# Patient Record
Sex: Male | Born: 1935 | Race: White | Hispanic: No | Marital: Married | Smoking: Former smoker
Health system: Southern US, Community
[De-identification: ages and names within clinical notes are randomized; demographics above are authoritative.]

## PROBLEM LIST (undated history)

## (undated) DIAGNOSIS — R001 Bradycardia, unspecified: Secondary | ICD-10-CM

## (undated) DIAGNOSIS — I639 Cerebral infarction, unspecified: Secondary | ICD-10-CM

## (undated) DIAGNOSIS — Z7901 Long term (current) use of anticoagulants: Secondary | ICD-10-CM

## (undated) DIAGNOSIS — F32A Depression, unspecified: Secondary | ICD-10-CM

## (undated) DIAGNOSIS — I4891 Unspecified atrial fibrillation: Principal | ICD-10-CM

## (undated) DIAGNOSIS — K602 Anal fissure, unspecified: Secondary | ICD-10-CM

## (undated) DIAGNOSIS — I10 Essential (primary) hypertension: Secondary | ICD-10-CM

## (undated) DIAGNOSIS — E785 Hyperlipidemia, unspecified: Secondary | ICD-10-CM

## (undated) DIAGNOSIS — M25569 Pain in unspecified knee: Secondary | ICD-10-CM

## (undated) DIAGNOSIS — M1990 Unspecified osteoarthritis, unspecified site: Secondary | ICD-10-CM

## (undated) DIAGNOSIS — E119 Type 2 diabetes mellitus without complications: Secondary | ICD-10-CM

## (undated) DIAGNOSIS — F329 Major depressive disorder, single episode, unspecified: Secondary | ICD-10-CM

## (undated) HISTORY — DX: Essential (primary) hypertension: I10

## (undated) HISTORY — DX: Long term (current) use of anticoagulants: Z79.01

## (undated) HISTORY — DX: Hyperlipidemia, unspecified: E78.5

## (undated) HISTORY — DX: Bradycardia, unspecified: R00.1

## (undated) HISTORY — DX: Anal fissure, unspecified: K60.2

## (undated) HISTORY — DX: Cerebral infarction, unspecified: I63.9

## (undated) HISTORY — DX: Pain in unspecified knee: M25.569

---

## 1999-07-13 HISTORY — PX: CATARACT EXTRACTION W/ INTRAOCULAR LENS  IMPLANT, BILATERAL: SHX1307

## 2005-11-10 ENCOUNTER — Emergency Department (HOSPITAL_COMMUNITY): Admission: EM | Admit: 2005-11-10 | Discharge: 2005-11-10 | Payer: Self-pay | Admitting: Family Medicine

## 2008-04-22 ENCOUNTER — Emergency Department (HOSPITAL_COMMUNITY): Admission: EM | Admit: 2008-04-22 | Discharge: 2008-04-22 | Payer: Self-pay | Admitting: Emergency Medicine

## 2008-07-08 ENCOUNTER — Encounter: Admission: RE | Admit: 2008-07-08 | Discharge: 2008-07-08 | Payer: Self-pay | Admitting: Neurology

## 2011-08-08 LAB — COMPREHENSIVE METABOLIC PANEL
ALT: 18
BUN: 15
CO2: 24
Chloride: 103
Creatinine, Ser: 0.85
GFR calc non Af Amer: >60
Potassium: 3.7
Total Bilirubin: 0.7

## 2011-08-08 LAB — CBC
HCT: 39.5
Hemoglobin: 13.4
MCV: 97.3
Platelets: 160
RBC: 4.06 — ABNORMAL LOW
WBC: 7.3

## 2011-08-08 LAB — URINALYSIS, ROUTINE W REFLEX MICROSCOPIC
Glucose, UA: NEGATIVE
Hgb urine dipstick: NEGATIVE
Protein, ur: NEGATIVE
pH: 8

## 2011-08-08 LAB — POCT CARDIAC MARKERS
CKMB, poc: 1.2
Myoglobin, poc: 75.9

## 2011-08-08 LAB — DIFFERENTIAL
Basophils Absolute: 0
Eosinophils Absolute: 0
Lymphocytes Relative: 17
Monocytes Relative: 7
Neutro Abs: 5.4
Neutrophils Relative %: 75

## 2011-08-08 LAB — D-DIMER, QUANTITATIVE: D-Dimer, Quant: 0.29

## 2013-11-11 DIAGNOSIS — M25569 Pain in unspecified knee: Secondary | ICD-10-CM

## 2013-11-11 HISTORY — DX: Pain in unspecified knee: M25.569

## 2014-04-05 ENCOUNTER — Encounter (INDEPENDENT_AMBULATORY_CARE_PROVIDER_SITE_OTHER): Payer: Self-pay | Admitting: General Surgery

## 2014-04-05 ENCOUNTER — Encounter (INDEPENDENT_AMBULATORY_CARE_PROVIDER_SITE_OTHER): Payer: Self-pay | Admitting: Student-PharmD

## 2014-04-07 ENCOUNTER — Ambulatory Visit (INDEPENDENT_AMBULATORY_CARE_PROVIDER_SITE_OTHER): Payer: Self-pay | Admitting: General Surgery

## 2014-04-29 ENCOUNTER — Ambulatory Visit (INDEPENDENT_AMBULATORY_CARE_PROVIDER_SITE_OTHER): Payer: Medicare Other | Admitting: Cardiovascular Disease

## 2014-04-29 ENCOUNTER — Encounter: Payer: Self-pay | Admitting: Cardiovascular Disease

## 2014-04-29 VITALS — BP 137/70 | HR 78 | Ht 69.25 in | Wt 182.0 lb

## 2014-04-29 DIAGNOSIS — R001 Bradycardia, unspecified: Secondary | ICD-10-CM | POA: Insufficient documentation

## 2014-04-29 DIAGNOSIS — E785 Hyperlipidemia, unspecified: Secondary | ICD-10-CM | POA: Insufficient documentation

## 2014-04-29 DIAGNOSIS — I498 Other specified cardiac arrhythmias: Secondary | ICD-10-CM

## 2014-04-29 DIAGNOSIS — I10 Essential (primary) hypertension: Secondary | ICD-10-CM | POA: Insufficient documentation

## 2014-04-29 DIAGNOSIS — R42 Dizziness and giddiness: Secondary | ICD-10-CM

## 2014-04-29 NOTE — Assessment & Plan Note (Signed)
Wall history of bradycardia. He had heart rates in the 30s and 40s recently with complaints of excessive fatigue and presyncope. He denies chest pain but does get dyspneic on exertion which has not changed in frequency or severity. His other problems include hypertension and hyperlipidemia. He is on no rate slowing drugs. I'm going to get a one-month event monitor and a 2-D echocardiogram. He may ultimately require permanent transvenous pacing.

## 2014-04-29 NOTE — Patient Instructions (Signed)
Your physician has recommended that you wear an event monitor for 30 DAYS. Event monitors are medical devices that record the heart's electrical activity. Doctors most often us these monitors to diagnose arrhythmias. Arrhythmias are problems with the speed or rhythm of the heartbeat. The monitor is a small, portable device. You can wear one while you do your normal daily activities. This is usually used to diagnose what is causing palpitations/syncope (passing out).  Your physician has requested that you have an echocardiogram. Echocardiography is a painless test that uses sound waves to create images of your heart. It provides your doctor with information about the size and shape of your heart and how well your heart's chambers and valves are working. This procedure takes approximately one hour. There are no restrictions for this procedure.  Your physician recommends that you schedule a follow-up appointment after you wear the monitor and have the echocardiogram.

## 2014-04-29 NOTE — Assessment & Plan Note (Signed)
On red yeast rice followed by his PCP 

## 2014-04-29 NOTE — Assessment & Plan Note (Signed)
On appropriate medications, well-controlled

## 2014-04-29 NOTE — Progress Notes (Signed)
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 Lang Morgan for evaluation of symptomatic bradycardia. He has a history of hypertension medically treated as well as hyperlipidemia. There is no other cardiovascular risk factor history. He has had bradycardia in the past. He says his blood pressure at home. He's had recent heart rates in the 30s and 40s with excessive fatigue and dizziness which is orthostatic. He denies chest pain but does have dyspnea on exertion   Current Outpatient Prescriptions  Medication Sig Dispense Refill  . amLODipine (NORVASC) 10 MG tablet Take 10 mg by mouth daily.      [name] aspirin 81 MG tablet Take 81 mg by mouth daily.      . citalopram (CELEXA) 10 MG tablet Take 10 mg by mouth daily.      . Coenzyme Q10 (CO Q-10) 200 MG CAPS Take by mouth.      [name] glucose blood test strip 1 each by Other route as needed for other. Use as instructed      . hydrocortisone (ANUSOL-HC) 2.5 % rectal cream Place 1 application rectally 2 (two) times daily.      . Incontinent Wash (BALNEOL EX) Apply topically as needed.      . Lidocaine-Hydrocortisone Ace (ANAMANTLE HC) 3-0.5 % KIT Place rectally.      . Multiple Vitamin (MULTIVITAMIN) capsule Take 1 capsule by mouth daily.      . NON FORMULARY CVS lubricant eye drops qhs      . NON FORMULARY Mura 5% eye ointment in right eye qhs      . Omega 3 1200 MG CAPS Take 1,200 mg by mouth daily.      . pimecrolimus (ELIDEL) 1 % cream Apply topically 2 (two) times daily.      . Red Yeast Rice 600 MG CAPS Take by mouth.      . Simethicone (GAS-X PO) Take by mouth.      . sitaGLIPtin-metformin (JANUMET) 50-1000 MG per tablet Take 1 tablet by mouth 2 (two) times daily with a meal.      . tamsulosin  (FLOMAX) 0.4 MG CAPS capsule Take 0.4 mg by mouth.      . valsartan (DIOVAN) 160 MG tablet Take 160 mg by mouth daily.      . vitamin C (ASCORBIC ACID) 500 MG tablet Take 500 mg by mouth daily.       No current facility-administered medications for this visit.    No Known Allergies  History   Social History  . Marital Status: Single    Spouse Name: N/A    Number of Children: N/A  . Years of Education: N/A   Occupational History  . Not on file.   Social History Main Topics  . Smoking status: Former Research scientist (life sciences)  . Smokeless tobacco: Not on file  . Alcohol Use: Yes     Comment: one daily  . Drug Use: No  . Sexual Activity: Not on file   Other Topics Concern  . Not on file   Social History Narrative  . No narrative on file     Review of Systems: General: negative for chills, fever, night sweats or weight changes.  Cardiovascular: negative for chest pain, dyspnea on exertion, edema, orthopnea, palpitations, paroxysmal nocturnal dyspnea or  shortness of breath Dermatological: negative for rash Respiratory: negative for cough or wheezing Urologic: negative for hematuria Abdominal: negative for nausea, vomiting, diarrhea, bright red blood per rectum, melena, or hematemesis Neurologic: negative for visual changes, syncope, or dizziness All other systems reviewed and are otherwise negative except as noted above.    Blood pressure 137/70, pulse 78, height 5' 9.25" (1.759 m), weight 182 lb (82.555 kg).  General appearance: alert and no distress Neck: no adenopathy, no carotid bruit, no JVD, supple, symmetrical, trachea midline and thyroid not enlarged, symmetric, no tenderness/mass/nodules Lungs: clear to auscultation bilaterally Heart: regular rate and rhythm, S1, S2 normal, no murmur, click, rub or gallop Extremities: extremities normal, atraumatic, no cyanosis or edema  EKG sinus bradycardia 44 without ST or T wave changes performed by his PCP on 6/17  ASSESSMENT AND PLAN:    Essential hypertension On appropriate medications, well-controlled  Bradycardia Wall history of bradycardia. He had heart rates in the 30s and 40s recently with complaints of excessive fatigue and presyncope. He denies chest pain but does get dyspneic on exertion which has not changed in frequency or severity. His other problems include hypertension and hyperlipidemia. He is on no rate slowing drugs. I'm going to get a one-month event monitor and a 2-D echocardiogram. He may ultimately require permanent transvenous pacing.  Hyperlipidemia On red yeast rice followed by his PCP      Ross Harp MD [facility], [facility] 04/29/2014 12:29 PM

## 2014-05-02 ENCOUNTER — Telehealth: Payer: Self-pay | Admitting: Cardiovascular Disease

## 2014-05-02 NOTE — Telephone Encounter (Signed)
Calling to see what he needs to do about getting more batteries because there are only enough for 2 weeks.  Told to call the # for [employer] to request more supplies.  Also, wanted to make sure the monitor was recording while charging.  Assured it will continue to monitor.  Patient voiced understanding.

## 2014-05-02 NOTE — Telephone Encounter (Signed)
Pt need to talk to you about the monitor he is wearing and the batteries he have for it.i

## 2014-05-02 NOTE — Telephone Encounter (Signed)
Calling to see what he needs to do about getting more batteries because there are only enough for 2 weeks.  Told to call the # for [employer] to request more supplies.  Also, wanted to make sure the monitor was recording while charging.  Assured it will continue to monitor.  Patient voiced understanding. 

## 2014-05-04 ENCOUNTER — Encounter (INDEPENDENT_AMBULATORY_CARE_PROVIDER_SITE_OTHER): Payer: Self-pay | Admitting: General Surgery

## 2014-05-04 ENCOUNTER — Ambulatory Visit (INDEPENDENT_AMBULATORY_CARE_PROVIDER_SITE_OTHER): Payer: Medicare Other | Admitting: General Surgery

## 2014-05-04 VITALS — BP 126/78 | HR 80 | Temp 97.5°F | Ht 70.0 in | Wt 182.0 lb

## 2014-05-04 DIAGNOSIS — L290 Pruritus ani: Secondary | ICD-10-CM

## 2014-05-04 NOTE — Patient Instructions (Addendum)
Anal Pruritus Anal pruritus is an itching of the anus, which is often due to increased moisture of the skin around the anus. Moisture may be due to sweating or a small amount of remaining stool. The itching and scratching can cause further skin damage.  CAUSES   Poor hygiene.  Excessive moisture from sweating or residual stool in the anal area.  Perfumed soaps and sprays and colored toilet paper.  Chemicals in the foods you eat.  Dietary factors such as caffeine, beer, milk products, chocolate, nuts, citrus fruits, tomatoes, spicy seasonings, jalapeno peppers, and salsa.  Hemorrhoids, infections, and other anal diseases.  Excessive washing.  Overuse of laxatives.  Skin disorders (psoriasis, eczema, or seborrhea). HOME CARE INSTRUCTIONS   Practice good hygiene.  Clean the anal area gently with wet toilet paper, baby wipes, or a wet washcloth after every bowel movement and at bedtime. Avoid using soaps on the anal area. Dry the area thoroughly. Pat the area dry with toilet paper or a towel.  Do not scrub the anal area with anything, even toilet paper.  Try not to scratch the itchy area. Scratching produces more damage, which makes the itching worse.  Take sitz baths in warm water for 15 to 20 minutes, 2 to 3 times a day. Pat the area dry with a soft cloth after each bath.  Zinc oxide ointment or a moisture barrier cream can be applied several times daily to protect the skin.  Only take medicines as directed by your caregiver.  Talk to your caregiver about fiber supplements. These are helpful in normalizing the stool if you have frequent loose stools.  Wear cotton underwear and loose clothing.  Do not use irritants such as bubble baths, scented toilet paper, or genital deodorants. SEEK MEDICAL CARE IF:   Itching does not improve in several days or gets worse.  You have a fever.  There are problems with increased pain, swelling, or redness. MAKE SURE YOU:   Understand  these instructions.  Will watch your condition.  Will get help right away if you are not doing well or get worse. Document Released: 04/29/2011 Document Revised: 01/20/2012 Document Reviewed: 04/29/2011 [facility] Patient Information 2015 [employer], [place]. This information is not intended to replace advice given to you by your health care provider. Make sure you discuss any questions you have with your health care provider.  GETTING TO GOOD BOWEL HEALTH. Irregular bowel habits such as constipation and diarrhea can lead to many problems over time.  Having one soft bowel movement a day is the most important way to prevent further problems.  The anorectal canal is designed to handle stretching and feces to safely manage our ability to get rid of solid waste (feces, poop, stool) out of our body.  BUT, hard constipated stools can act like ripping concrete bricks and diarrhea can be a burning fire to this very sensitive area of our body, causing inflamed hemorrhoids, anal fissures, increasing risk is perirectal abscesses, abdominal pain/bloating, an making irritable bowel worse.     The goal: ONE SOFT BOWEL MOVEMENT A DAY!  To have soft, regular bowel movements:    Drink at least 8 tall glasses of water a day.     Take plenty of fiber.  Fiber is the undigested part of plant food that passes into the colon, acting s "natures broom" to encourage bowel motility and movement.  Fiber can absorb and hold large amounts of water. This results in a larger, bulkier stool, which is soft and  easier to pass. Work gradually over several weeks up to 6 servings a day of fiber (25g a day even more if needed) in the form of: o Vegetables -- Root (potatoes, carrots, turnips), leafy green (lettuce, salad greens, celery, spinach), or cooked high residue (cabbage, broccoli, etc) o Fruit -- Fresh (unpeeled skin & pulp), Dried (prunes, apricots, cherries, etc ),  or stewed ( applesauce)  o Whole grain breads, pasta, etc (whole wheat)   o Bran cereals    Bulking Agents -- This type of water-retaining fiber generally is easily obtained each day by one of the following:  o Psyllium bran -- The psyllium plant is remarkable because its ground seeds can retain so much water. This product is available as Metamucil, Konsyl, Effersyllium, Per Diem Fiber, or the less expensive generic preparation in drug and health food stores. Although labeled a laxative, it really is not a laxative.  o Methylcellulose -- This is another fiber derived from wood which also retains water. It is available as Citrucel. o Benefiber o Polyethylene Glycol - and "artificial" fiber commonly called Miralax or Glycolax.  It is helpful for people with gassy or bloated feelings with regular fiber o Flax Seed - a less gassy fiber than psyllium   No reading or other relaxing activity while on the toilet. If bowel movements take longer than 5 minutes, you are too constipated   AVOID CONSTIPATION.  High fiber and water intake usually takes care of this.  Sometimes a laxative is needed to stimulate more frequent bowel movements, but    Laxatives are not a good long-term solution as it can wear the colon out. o Osmotics (Milk of Magnesia, Fleets phosphosoda, Magnesium citrate, MiraLax, GoLytely) are safer than  o Stimulants (Senokot, Castor Oil, Dulcolax, Ex Lax)    o Do not take laxatives for more than 7days in a row.    IF SEVERELY CONSTIPATED, try a Bowel Retraining Program: o Do not use laxatives.  o Eat a diet high in roughage, such as bran cereals and leafy vegetables.  o Drink six (6) ounces of prune or apricot juice each morning.  o Eat two (2) large servings of stewed fruit each day.  o Take one (1) heaping tablespoon of a psyllium-based bulking agent twice a day. Use sugar-free sweetener when possible to avoid excessive calories.  o Eat a normal breakfast.  o Set aside 15 minutes after breakfast to sit on the toilet, but do not strain to have a bowel movement.   o If you do not have a bowel movement by the third day, use an enema and repeat the above steps.    Controlling diarrhea o Switch to liquids and simpler foods for a few days to avoid stressing your intestines further. o Avoid dairy products (especially milk & ice cream) for a short time.  The intestines often can lose the ability to digest lactose when stressed. o Avoid foods that cause gassiness or bloating.  Typical foods include beans and other legumes, cabbage, broccoli, and dairy foods.  Every person has some sensitivity to other foods, so listen to our body and avoid those foods that trigger problems for you. o Adding fiber (Citrucel, Metamucil, psyllium, Miralax) gradually can help thicken stools by absorbing excess fluid and retrain the intestines to act more normally.  Slowly increase the dose over a few weeks.  Too much fiber too soon can backfire and cause cramping & bloating. o Probiotics (such as active yogurt, Align, etc) may help repopulate  the intestines and colon with normal bacteria and calm down a sensitive digestive tract.  Most studies show it to be of mild help, though, and such products can be costly. o Medicines:   Bismuth subsalicylate (ex. Kayopectate, Pepto Bismol) every 30 minutes for up to 6 doses can help control diarrhea.  Avoid if pregnant.   Loperamide (Immodium) can slow down diarrhea.  Start with two tablets (3m total) first and then try one tablet every 6 hours.  Avoid if you are having fevers or severe pain.  If you are not better or start feeling worse, stop all medicines and call your doctor for advice o Call your doctor if you are getting worse or not better.  Sometimes further testing (cultures, endoscopy, X-ray studies, bloodwork, etc) may be needed to help diagnose and treat the cause of the diarrhea. o

## 2014-05-06 ENCOUNTER — Ambulatory Visit (HOSPITAL_COMMUNITY)
Admission: RE | Admit: 2014-05-06 | Discharge: 2014-05-06 | Disposition: A | Discharge status: Home / Self Care | Payer: Medicare Other | Source: Ambulatory Visit | Attending: Cardiovascular Disease | Admitting: Cardiovascular Disease

## 2014-05-06 DIAGNOSIS — I498 Other specified cardiac arrhythmias: Secondary | ICD-10-CM | POA: Insufficient documentation

## 2014-05-06 DIAGNOSIS — R001 Bradycardia, unspecified: Secondary | ICD-10-CM

## 2014-05-06 DIAGNOSIS — I379 Nonrheumatic pulmonary valve disorder, unspecified: Secondary | ICD-10-CM

## 2014-05-06 DIAGNOSIS — I10 Essential (primary) hypertension: Secondary | ICD-10-CM | POA: Insufficient documentation

## 2014-05-06 NOTE — Progress Notes (Signed)
2D Echocardiogram Complete.  05/06/2014   Bethany McMahill, RDCS  

## 2014-05-10 ENCOUNTER — Encounter: Payer: Self-pay | Admitting: *Deleted

## 2014-05-11 ENCOUNTER — Telehealth: Payer: Self-pay | Admitting: Cardiovascular Disease

## 2014-05-11 NOTE — Telephone Encounter (Signed)
[employer] called and they said pt. Is in a out of service network and needs to hooked up to a land line.and they would call him back and take care of it

## 2014-05-11 NOTE — Telephone Encounter (Signed)
Patient states he eontinues to have problems with his [employer] monitor and cannot get anyone from [employer] to call him.  He has called them 6 times and they will not call back.  He states that we may have more "pull" than he does. Please advise patient what to do next.

## 2014-05-12 NOTE — Progress Notes (Signed)
Patient ID: Ross Morgan, male   DOB: 1927/11/19, 80 y.o.   MRN: [id number]  Chief Complaint  Patient presents with  . Anal Fissure    HPI Ross Morgan is a 80 y.o. male.   HPI 80 year old Caucasian male referred by Dr. Brigitte Pulse for evaluation of a probable anal fissure. The patient states he's been having discomfort for about a year now. He reports 2-3 bowel movements per day. He describes pain with defecation. He also reports some urgency and some decreased control. He states that when he has an urgency to get it that he has to find a bathroom immediately. He denies any melena or hematochezia. He denies anything protruding out of his rectum after having a bowel movement. He does clean quite vigorously. He states that sometimes it feels as if he's trying to pass a square block through a round hole. He doesn't force perianal itching and burning. He has tried lidocaine ointment which actually worsened the symptoms. He has been on some other cream which really hasn't noticed an improvement. He uses an over-the-counter cleanser after having a bowel movement. He drinks plenty of water. Past Medical History  Diagnosis Date  . Diabetes mellitus without complication   . Routine general medical examination at a health care facility   . Hypertension   . Hyperlipidemia   . GERD (gastroesophageal reflux disease)   . Anal fissure   . Knee pain   . Bradycardia     History reviewed. No pertinent past surgical history.  History reviewed. No pertinent family history.  Social History History  Substance Use Topics  . Smoking status: Former Research scientist (life sciences)  . Smokeless tobacco: Not on file  . Alcohol Use: Yes     Comment: one daily    No Known Allergies  Current Outpatient Prescriptions  Medication Sig Dispense Refill  . amLODipine (NORVASC) 10 MG tablet Take 10 mg by mouth daily.      [name] aspirin 81 MG tablet Take 81 mg by mouth daily.      . citalopram (CELEXA) 10 MG tablet Take 10 mg by mouth daily.       . Coenzyme Q10 (CO Q-10) 200 MG CAPS Take by mouth.      [name] glucose blood test strip 1 each by Other route as needed for other. Use as instructed      . hydrocortisone (ANUSOL-HC) 2.5 % rectal cream Place 1 application rectally 2 (two) times daily.      . Incontinent Wash (BALNEOL EX) Apply topically as needed.      . Lidocaine-Hydrocortisone Ace (ANAMANTLE HC) 3-0.5 % KIT Place rectally.      . Multiple Vitamin (MULTIVITAMIN) capsule Take 1 capsule by mouth daily.      . NON FORMULARY CVS lubricant eye drops qhs      . NON FORMULARY Mura 5% eye ointment in right eye qhs      . Omega 3 1200 MG CAPS Take 1,200 mg by mouth daily.      . pimecrolimus (ELIDEL) 1 % cream Apply topically 2 (two) times daily.      . Red Yeast Rice 600 MG CAPS Take by mouth.      . Simethicone (GAS-X PO) Take by mouth.      . sitaGLIPtin-metformin (JANUMET) 50-1000 MG per tablet Take 1 tablet by mouth 2 (two) times daily with a meal.      . tamsulosin (FLOMAX) 0.4 MG CAPS capsule Take 0.4 mg by mouth.      [name]  valsartan (DIOVAN) 160 MG tablet Take 160 mg by mouth daily.      . vitamin C (ASCORBIC ACID) 500 MG tablet Take 500 mg by mouth daily.       No current facility-administered medications for this visit.    Review of Systems Review of Systems  Blood pressure 126/78, pulse 80, temperature 97.5 F (36.4 C), height $RemoveBe'5\' 10"'miHWdYwXM$  (1.778 m), weight 182 lb (82.555 kg).  Physical Exam Physical Exam  Constitutional: He is oriented to person, place, and time. He appears well-developed and well-nourished. No distress.  HENT:  Head: Normocephalic and atraumatic.  Right Ear: External ear normal.  Left Ear: External ear normal.  Eyes: Conjunctivae are normal. No scleral icterus.  Neck: Normal range of motion. Neck supple. No tracheal deviation present. No thyromegaly present.  Cardiovascular: Normal rate, normal heart sounds and intact distal pulses.   Pulmonary/Chest: Effort normal and breath sounds normal. No  respiratory distress. He has no wheezes.  Abdominal: Soft. He exhibits no distension. There is no tenderness. There is no rebound and no guarding.  Genitourinary: Rectal exam shows no external hemorrhoid and anal tone normal.  Circumferential area of hyperpigmentation of perianal skin, circumferential scattered small crevices in anoderm  Musculoskeletal: Normal range of motion. He exhibits no edema and no tenderness.  Lymphadenopathy:    He has no cervical adenopathy.  Neurological: He is alert and oriented to person, place, and time. He exhibits normal muscle tone.  Skin: Skin is warm and dry. No rash noted. He is not diaphoretic. No erythema. No pallor.  Psychiatric: He has a normal mood and affect. His behavior is normal. Judgment and thought content normal.    Data Reviewed Notes from Dr Brigitte Pulse  Assessment    Pruritus Ani     Plan    I think his physical exam is more consistent with pruritus ani. He has small fissures or small cracks in the anoderm in more than one location. Moreover he has perianal skin changes. I think this all points more toward pruritus and then a true classic anal fissure. My suspicion for inflammatory bowel disease is quite low. He was given educational material regarding pruritus and. We discussed that it is often the result of over cleaning. I advised him to start using the over-the-counter cleanser. He was instructed to use wet wipes. He is advised that he did apply a barrier ointment like zinc oxide. We discussed the importance of avoiding as soap as possible in the area. We discussed using wet wipes or moist toilet paper. We discussed avoiding excessive moisture in the area. followup 6 weeks  Leighton Ruff. Redmond Pulling, MD, FACS General, Bariatric, & Minimally Invasive Surgery [facility], [place]        [facility] M 05/12/2014, 6:25 PM

## 2014-05-29 ENCOUNTER — Telehealth: Payer: Self-pay | Admitting: Cardiology

## 2014-05-29 NOTE — Telephone Encounter (Signed)
[employer] called saying Mr Tresa MooreJewson had had some PAF and slow rates. I called the pt at home who said he was feeling fine at the moment. He does have some am fatigue which he has had. He denies palpitations or near syncope. I have asked him to come to the office in the am for an EKG. If he is in AF that may impact out treatment.   Corine ShelterLUKE Yasmina Chico PA-C 05/29/2014 12:29 PM

## 2014-05-30 ENCOUNTER — Telehealth: Payer: Self-pay | Admitting: *Deleted

## 2014-05-30 ENCOUNTER — Encounter (HOSPITAL_COMMUNITY): Payer: Self-pay | Admitting: Cardiology

## 2014-05-30 ENCOUNTER — Inpatient Hospital Stay (HOSPITAL_COMMUNITY): Payer: Medicare Other

## 2014-05-30 ENCOUNTER — Inpatient Hospital Stay (HOSPITAL_COMMUNITY)
Admission: AD | Admit: 2014-05-30 | Discharge: 2014-06-01 | DRG: 244 | Disposition: A | Discharge status: Home / Self Care | Payer: Medicare Other | Source: Ambulatory Visit | Attending: Cardiovascular Disease | Admitting: Cardiovascular Disease

## 2014-05-30 DIAGNOSIS — Z79899 Other long term (current) drug therapy: Secondary | ICD-10-CM

## 2014-05-30 DIAGNOSIS — Z7901 Long term (current) use of anticoagulants: Secondary | ICD-10-CM

## 2014-05-30 DIAGNOSIS — I10 Essential (primary) hypertension: Secondary | ICD-10-CM | POA: Diagnosis present

## 2014-05-30 DIAGNOSIS — E785 Hyperlipidemia, unspecified: Secondary | ICD-10-CM | POA: Diagnosis present

## 2014-05-30 DIAGNOSIS — E119 Type 2 diabetes mellitus without complications: Secondary | ICD-10-CM | POA: Diagnosis present

## 2014-05-30 DIAGNOSIS — Z87891 Personal history of nicotine dependence: Secondary | ICD-10-CM

## 2014-05-30 DIAGNOSIS — R001 Bradycardia, unspecified: Secondary | ICD-10-CM | POA: Diagnosis present

## 2014-05-30 DIAGNOSIS — K219 Gastro-esophageal reflux disease without esophagitis: Secondary | ICD-10-CM | POA: Diagnosis present

## 2014-05-30 DIAGNOSIS — I4891 Unspecified atrial fibrillation: Principal | ICD-10-CM | POA: Diagnosis present

## 2014-05-30 DIAGNOSIS — I495 Sick sinus syndrome: Secondary | ICD-10-CM | POA: Diagnosis present

## 2014-05-30 DIAGNOSIS — Z7982 Long term (current) use of aspirin: Secondary | ICD-10-CM

## 2014-05-30 HISTORY — DX: Depression, unspecified: F32.A

## 2014-05-30 HISTORY — DX: Bradycardia, unspecified: R00.1

## 2014-05-30 HISTORY — DX: Unspecified atrial fibrillation: I48.91

## 2014-05-30 HISTORY — DX: Type 2 diabetes mellitus without complications: E11.9

## 2014-05-30 HISTORY — DX: Major depressive disorder, single episode, unspecified: F32.9

## 2014-05-30 HISTORY — DX: Unspecified osteoarthritis, unspecified site: M19.90

## 2014-05-30 LAB — CBC WITH DIFFERENTIAL/PLATELET
BASOS ABS: 0 10*3/uL (ref 0.0–0.1)
Basophils Relative: 0 % (ref 0–1)
EOS ABS: 0.2 10*3/uL (ref 0.0–0.7)
EOS PCT: 2 % (ref 0–5)
HCT: 40.8 % (ref 39.0–52.0)
Hemoglobin: 13.4 g/dL (ref 13.0–17.0)
LYMPHS ABS: 3.2 10*3/uL (ref 0.7–4.0)
Lymphocytes Relative: 40 % (ref 12–46)
MCH: 32.5 pg (ref 26.0–34.0)
MCHC: 32.8 g/dL (ref 30.0–36.0)
MCV: 99 fL (ref 78.0–100.0)
Monocytes Absolute: 0.5 10*3/uL (ref 0.1–1.0)
Monocytes Relative: 7 % (ref 3–12)
NEUTROS PCT: 51 % (ref 43–77)
Neutro Abs: 4 10*3/uL (ref 1.7–7.7)
Platelets: 159 10*3/uL (ref 150–400)
RBC: 4.12 MIL/uL — ABNORMAL LOW (ref 4.22–5.81)
RDW: 12.4 % (ref 11.5–15.5)
WBC: 7.8 10*3/uL (ref 4.0–10.5)

## 2014-05-30 LAB — APTT: aPTT: 25 seconds (ref 24–37)

## 2014-05-30 LAB — COMPREHENSIVE METABOLIC PANEL
ALBUMIN: 3.8 g/dL (ref 3.5–5.2)
ALT: 13 U/L (ref 0–53)
AST: 15 U/L (ref 0–37)
Alkaline Phosphatase: 65 U/L (ref 39–117)
Anion gap: 13 (ref 5–15)
BUN: 22 mg/dL (ref 6–23)
CALCIUM: 9.2 mg/dL (ref 8.4–10.5)
CO2: 23 mEq/L (ref 19–32)
CREATININE: 1.12 mg/dL (ref 0.50–1.35)
Chloride: 103 mEq/L (ref 96–112)
GFR calc Af Amer: 68 mL/min — ABNORMAL LOW (ref >90)
GFR calc non Af Amer: 59 mL/min — ABNORMAL LOW (ref >90)
Glucose, Bld: 107 mg/dL — ABNORMAL HIGH (ref 70–99)
Potassium: 4.2 mEq/L (ref 3.7–5.3)
SODIUM: 139 meq/L (ref 137–147)
Total Bilirubin: 0.2 mg/dL — ABNORMAL LOW (ref 0.3–1.2)
Total Protein: 7.2 g/dL (ref 6.0–8.3)

## 2014-05-30 LAB — GLUCOSE, CAPILLARY: Glucose-Capillary: 127 mg/dL — ABNORMAL HIGH (ref 70–99)

## 2014-05-30 LAB — MAGNESIUM: MAGNESIUM: 1.9 mg/dL (ref 1.5–2.5)

## 2014-05-30 LAB — TSH: TSH: 5.31 u[IU]/mL — AB (ref 0.350–4.500)

## 2014-05-30 LAB — PROTIME-INR
INR: 1.02 (ref 0.00–1.49)
Prothrombin Time: 13.4 seconds (ref 11.6–15.2)

## 2014-05-30 MED ORDER — TAMSULOSIN HCL 0.4 MG PO CAPS
0.4000 mg | ORAL_CAPSULE | Freq: Every day | ORAL | Status: DC
  Administered 2014-05-30 – 2014-05-31 (×2): 0.4 mg via ORAL
  Filled 2014-05-30 (×3): qty 1

## 2014-05-30 MED ORDER — CEFAZOLIN SODIUM-DEXTROSE 2-3 GM-% IV SOLR
2.0000 g | INTRAVENOUS | Status: DC
  Filled 2014-05-30: qty 50

## 2014-05-30 MED ORDER — SODIUM CHLORIDE 0.9 % IR SOLN
80.0000 mg | Status: DC
  Filled 2014-05-30: qty 2

## 2014-05-30 MED ORDER — OMEGA-3-ACID ETHYL ESTERS 1 G PO CAPS
1.0000 g | ORAL_CAPSULE | Freq: Every day | ORAL | Status: DC
  Administered 2014-05-31: 1 g via ORAL
  Filled 2014-05-30 (×2): qty 1

## 2014-05-30 MED ORDER — MULTIVITAMINS PO CAPS: 1.0000 | ORAL_CAPSULE | Freq: Every day | ORAL | Status: DC

## 2014-05-30 MED ORDER — ONDANSETRON HCL 4 MG/2ML IJ SOLN: 4.0000 mg | Freq: Four times a day (QID) | INTRAMUSCULAR | Status: DC | PRN

## 2014-05-30 MED ORDER — HEPARIN BOLUS VIA INFUSION
4000.0000 [IU] | Freq: Once | INTRAVENOUS | Status: AC
  Administered 2014-05-30: 4000 [IU] via INTRAVENOUS
  Filled 2014-05-30: qty 4000

## 2014-05-30 MED ORDER — SODIUM CHLORIDE 0.9 % IJ SOLN: 3.0000 mL | INTRAMUSCULAR | Status: DC | PRN

## 2014-05-30 MED ORDER — OMEGA 3 1200 MG PO CAPS: 1200.0000 mg | ORAL_CAPSULE | Freq: Every day | ORAL | Status: DC

## 2014-05-30 MED ORDER — SODIUM CHLORIDE 0.9 % IV SOLN: 250.0000 mL | INTRAVENOUS | Status: DC | PRN

## 2014-05-30 MED ORDER — ACETAMINOPHEN 325 MG PO TABS
650.0000 mg | ORAL_TABLET | ORAL | Status: DC | PRN
  Administered 2014-05-31: 650 mg via ORAL
  Filled 2014-05-30: qty 2

## 2014-05-30 MED ORDER — SODIUM CHLORIDE 0.9 % IV SOLN: INTRAVENOUS | Status: DC

## 2014-05-30 MED ORDER — SODIUM CHLORIDE 0.9 % IV SOLN
INTRAVENOUS | Status: DC
  Administered 2014-05-31: 07:00:00 via INTRAVENOUS

## 2014-05-30 MED ORDER — ADULT MULTIVITAMIN W/MINERALS CH
1.0000 | ORAL_TABLET | Freq: Every day | ORAL | Status: DC
  Administered 2014-05-31 – 2014-06-01 (×2): 1 via ORAL
  Filled 2014-05-30 (×2): qty 1

## 2014-05-30 MED ORDER — CITALOPRAM HYDROBROMIDE 10 MG PO TABS
10.0000 mg | ORAL_TABLET | Freq: Every day | ORAL | Status: DC
  Administered 2014-05-30 – 2014-05-31 (×2): 10 mg via ORAL
  Filled 2014-05-30 (×3): qty 1

## 2014-05-30 MED ORDER — SODIUM CHLORIDE 0.9 % IJ SOLN
3.0000 mL | Freq: Two times a day (BID) | INTRAMUSCULAR | Status: DC
  Administered 2014-05-30: 3 mL via INTRAVENOUS

## 2014-05-30 MED ORDER — IRBESARTAN 150 MG PO TABS
150.0000 mg | ORAL_TABLET | Freq: Every day | ORAL | Status: DC
  Administered 2014-05-31: 150 mg via ORAL
  Filled 2014-05-30 (×2): qty 1

## 2014-05-30 MED ORDER — LINAGLIPTIN 5 MG PO TABS
5.0000 mg | ORAL_TABLET | Freq: Every day | ORAL | Status: DC
  Administered 2014-06-01: 5 mg via ORAL
  Filled 2014-05-30 (×2): qty 1

## 2014-05-30 MED ORDER — CHLORHEXIDINE GLUCONATE 4 % EX LIQD
60.0000 mL | Freq: Once | CUTANEOUS | Status: AC
  Administered 2014-05-30: 4 via TOPICAL
  Filled 2014-05-30: qty 60

## 2014-05-30 MED ORDER — METFORMIN HCL 500 MG PO TABS
1000.0000 mg | ORAL_TABLET | Freq: Two times a day (BID) | ORAL | Status: DC
  Filled 2014-05-30 (×6): qty 2

## 2014-05-30 MED ORDER — HEPARIN (PORCINE) IN NACL 100-0.45 UNIT/ML-% IJ SOLN
1150.0000 [IU]/h | INTRAMUSCULAR | Status: DC
  Administered 2014-05-30: 1150 [IU]/h via INTRAVENOUS
  Filled 2014-05-30 (×2): qty 250

## 2014-05-30 MED ORDER — SITAGLIPTIN PHOS-METFORMIN HCL 50-1000 MG PO TABS: 1.0000 | ORAL_TABLET | Freq: Two times a day (BID) | ORAL | Status: DC

## 2014-05-30 MED ORDER — CHLORHEXIDINE GLUCONATE 4 % EX LIQD
60.0000 mL | Freq: Once | CUTANEOUS | Status: AC
  Administered 2014-05-31: 4 via TOPICAL
  Filled 2014-05-30: qty 60

## 2014-05-30 MED ORDER — AMLODIPINE BESYLATE 10 MG PO TABS
10.0000 mg | ORAL_TABLET | Freq: Every day | ORAL | Status: DC
  Administered 2014-05-31 – 2014-06-01 (×2): 10 mg via ORAL
  Filled 2014-05-30 (×3): qty 1

## 2014-05-30 NOTE — H&P (Addendum)
Physician History and Physical    Patient ID: Ross Morgan MRN: [id number] DOB/AGE: 03-13-1928 80 y.o. Admit date: 05/30/2014  Primary Care Physician: Marton Redwood, MD Primary Cardiologist Quay Burow MD  HPI: 80 yo WM seen recently by Dr. Gwenlyn Found for evaluation of bradycardia. Patient referred by Dr. Brigitte Pulse. Ecg 04/27/14 demonstrated sinus brady with a HR of 44 bpm. He has complained of some orthostatic dizziness which has been chronic. Recently he has noted a decline in energy level. Sometimes just lying in bed he experiences excessive fatigue and feels better sitting up. No syncope. Still drives. Denies any known history of atrial fibrillation. Has been on ASA daily. Recent Echo showed mild LAE and normal LV function. Cardionet monitor placed. This showed Atrial fibrillation with a slow ventricular response and pauses of 4.1 and 4.5 seconds. No prior history of CAD, PAD, CVA or TIA. No history of bleeding. He does have a history of DM type 2 and HTN.  Review of systems complete and found to be negative unless listed above  Past Medical History  Diagnosis Date  . Diabetes mellitus without complication   . Routine general medical examination at a health care facility   . Hypertension   . Hyperlipidemia   . GERD (gastroesophageal reflux disease)   . Anal fissure   . Knee pain   . Bradycardia   . Atrial fibrillation     Family History  Problem Relation Age of Onset  . Heart attack Neg Hx     History   Social History  . Marital Status: Married    Spouse Name: N/A    Number of Children: 1  . Years of Education: N/A   Occupational History  . retired Optometrist    Social History Main Topics  . Smoking status: Former Research scientist (life sciences)  . Smokeless tobacco: Not on file  . Alcohol Use: Yes     Comment: one daily  . Drug Use: No  . Sexual Activity: Not on file   Other Topics Concern  . Not on file   Social History Narrative  . No narrative on file    No past surgical history on  file.   Prescriptions prior to admission  Medication Sig Dispense Refill  . amLODipine (NORVASC) 10 MG tablet Take 10 mg by mouth daily.      [name] aspirin 81 MG tablet Take 81 mg by mouth daily.      . citalopram (CELEXA) 10 MG tablet Take 10 mg by mouth daily.      . Coenzyme Q10 (CO Q-10) 200 MG CAPS Take by mouth.      [name] glucose blood test strip 1 each by Other route as needed for other. Use as instructed      . hydrocortisone (ANUSOL-HC) 2.5 % rectal cream Place 1 application rectally 2 (two) times daily.      . Incontinent Wash (BALNEOL EX) Apply topically as needed.      . Lidocaine-Hydrocortisone Ace (ANAMANTLE HC) 3-0.5 % KIT Place rectally.      . Multiple Vitamin (MULTIVITAMIN) capsule Take 1 capsule by mouth daily.      . NON FORMULARY CVS lubricant eye drops qhs      . NON FORMULARY Mura 5% eye ointment in right eye qhs      . Omega 3 1200 MG CAPS Take 1,200 mg by mouth daily.      . pimecrolimus (ELIDEL) 1 % cream Apply topically 2 (two) times daily.      [name] Red  Yeast Rice 600 MG CAPS Take by mouth.      . Simethicone (GAS-X PO) Take by mouth.      . sitaGLIPtin-metformin (JANUMET) 50-1000 MG per tablet Take 1 tablet by mouth 2 (two) times daily with a meal.      . tamsulosin (FLOMAX) 0.4 MG CAPS capsule Take 0.4 mg by mouth.      . valsartan (DIOVAN) 160 MG tablet Take 160 mg by mouth daily.      . vitamin C (ASCORBIC ACID) 500 MG tablet Take 500 mg by mouth daily.        Physical Exam: Blood pressure 144/57, pulse 78, temperature 98.3 F (36.8 C), temperature source Oral, resp. rate 18, SpO2 98.00%.  Current Weight  05/04/14 182 lb (82.555 kg)  04/29/14 182 lb (82.555 kg)    GENERAL:  Well appearing 80 yo WM in NAD. HEENT:  PERRL, EOMI, sclera are clear. Oropharynx is clear. NECK:  No jugular venous distention, carotid upstroke brisk and symmetric, no bruits, no thyromegaly or adenopathy LUNGS:  Clear to auscultation bilaterally CHEST:  Unremarkable HEART:  IRRR,   PMI not displaced or sustained,S1 and S2 within normal limits, no S3, no S4: no clicks, no rubs, no murmurs ABD:  Soft, nontender. BS +, no masses or bruits. No hepatomegaly, no splenomegaly EXT:  2 + pulses throughout, 1+ edema, no cyanosis no clubbing SKIN:  Warm and dry.  No rashes NEURO:  Alert and oriented x 3. Cranial nerves II through XII intact. PSYCH:  Cognitively intact    Labs:   Lab Results  Component Value Date   WBC 7.3 04/22/2008   HGB 13.4 04/22/2008   HCT 39.5 04/22/2008   MCV 97.3 04/22/2008   PLT 160 04/22/2008   No results found for this basename: NA, K, CL, CO2, BUN, CREATININE, CALCIUM, LABALBU, PROT, BILITOT, ALKPHOS, ALT, AST, GLUCOSE,  in the last 168 hours No results found for this basename: CKTOTA, CKMB, CKMBINDEX, TROPONINI    No results found for this basename: CHOL   No results found for this basename: HDL   No results found for this basename: LDLCALC   No results found for this basename: TRIG   No results found for this basename: CHOLHDL   No results found for this basename: LDLDIRECT    No results found for this basename: PROBNP   No results found for this basename: TSH   No results found for this basename: HGBA1C    Radiology: No results found.  EKG: Pending  Telemetry: Atrial fibrillation with HR 70s  Echo: 05/06/14:Study Conclusions  - Left ventricle: The cavity size was normal. There was focal basal hypertrophy. Systolic function was normal. The estimated ejection fraction was in the range of 60% to 65%. Wall motion was normal; there were no regional wall motion abnormalities. - Aortic valve: There was trivial regurgitation. - Left atrium: The atrium was mildly dilated.    ASSESSMENT AND PLAN:  1. Atrial fibrillation- new onset with slow ventricular response and 4.5 second pause. Mild symptomatology. Will check routine lab work and Ecg. Start IV heparin. Recommend permanent pacemaker implant. EP to see. Post pacemaker implant  would recommend anticoagulation with [place] Vasc score of 4.   2. HTN controlled.  3. DM type 2 without complications.   4. Hyperlipidemia.  Signed: Duval Macleod [place], [place]  05/30/2014, 3:27 PM

## 2014-05-30 NOTE — Progress Notes (Signed)
ANTICOAGULATION CONSULT NOTE - Initial Consult  Pharmacy Consult for heparin   Indication: atrial fibrillation  No Known Allergies  Patient Measurements: Height: 5' 10.08" (178 cm) Weight: 182 lb 1.6 oz (82.6 kg) IBW/kg (Calculated) : 73.18 Heparin Dosing Weight:   Vital Signs: Temp: 98.3 F (36.8 C) (07/20 1339) Temp src: Oral (07/20 1339) BP: 144/57 mmHg (07/20 1339) Pulse Rate: 78 (07/20 1339)  Labs: No results found for this basename: HGB, HCT, PLT, APTT, LABPROT, INR, HEPARINUNFRC, CREATININE, CKTOTAL, CKMB, TROPONINI,  in the last 72 hours  Estimated Creatinine Clearance: 68.2 ml/min (by C-G formula based on Cr of 0.85).   Medical History: Past Medical History  Diagnosis Date  . Diabetes mellitus without complication   . Routine general medical examination at a health care facility   . Hypertension   . Hyperlipidemia   . GERD (gastroesophageal reflux disease)   . Anal fissure   . Knee pain   . Bradycardia   . Atrial fibrillation     Medications:  Prescriptions prior to admission  Medication Sig Dispense Refill  . amLODipine (NORVASC) 10 MG tablet Take 10 mg by mouth daily.      [name] aspirin 81 MG tablet Take 81 mg by mouth daily.      . citalopram (CELEXA) 10 MG tablet Take 10 mg by mouth daily.      . Coenzyme Q10 (CO Q-10) 200 MG CAPS Take by mouth.      [name] glucose blood test strip 1 each by Other route as needed for other. Use as instructed      . hydrocortisone (ANUSOL-HC) 2.5 % rectal cream Place 1 application rectally 2 (two) times daily.      . Incontinent Wash (BALNEOL EX) Apply topically as needed.      . Lidocaine-Hydrocortisone Ace (ANAMANTLE HC) 3-0.5 % KIT Place rectally.      . Multiple Vitamin (MULTIVITAMIN) capsule Take 1 capsule by mouth daily.      . NON FORMULARY CVS lubricant eye drops qhs      . NON FORMULARY Mura 5% eye ointment in right eye qhs      . Omega 3 1200 MG CAPS Take 1,200 mg by mouth daily.      . pimecrolimus (ELIDEL) 1 %  cream Apply topically 2 (two) times daily.      . Red Yeast Rice 600 MG CAPS Take by mouth.      . Simethicone (GAS-X PO) Take by mouth.      . sitaGLIPtin-metformin (JANUMET) 50-1000 MG per tablet Take 1 tablet by mouth 2 (two) times daily with a meal.      . tamsulosin (FLOMAX) 0.4 MG CAPS capsule Take 0.4 mg by mouth.      . valsartan (DIOVAN) 160 MG tablet Take 160 mg by mouth daily.      . vitamin C (ASCORBIC ACID) 500 MG tablet Take 500 mg by mouth daily.       Scheduled:  . amLODipine  10 mg Oral Daily  . citalopram  10 mg Oral Daily  . irbesartan  150 mg Oral Daily  . [START ON 05/31/2014] linagliptin  5 mg Oral Daily   And  . metFORMIN  1,000 mg Oral BID WC  . multivitamin  1 capsule Oral Daily  . Omega 3  1,200 mg Oral Daily  . sodium chloride  3 mL Intravenous Q12H  . tamsulosin  0.4 mg Oral QPC supper    Assessment: 80 yo who presented with new  onset afib. Pt has not been on anticoagulant PTA. IV heparin has been ordered for anticoagulation.   Goal of Therapy:  Heparin level 0.3-0.7 units/ml Monitor platelets by anticoagulation protocol: Yes   Plan:   Baseline labs Heparin 4000 units bolus Heparin drip at 1150 units/hr F/u with 8 hr heparin level Daily level and CBC

## 2014-05-30 NOTE — Consult Note (Signed)
ELECTROPHYSIOLOGY CONSULT NOTE  Patient ID: Ross Morgan, MRN: [id number], DOB/AGE: 04-14-1928 80 y.o. Admit date: 05/30/2014 Date of Consult: 05/30/2014  Primary Physician: Marton Redwood, MD Primary Cardiologist: Shirlee More Hiram Comber  Chief Complaint: dizziness and bradycardia//paroysmal atrial fibrillation   HPI Ross Morgan is a 80 y.o. male  Admitted with abnormla cardionet monitor placed because of symptoms of LH and increasing dyspnea on exertion. This has been notable over the last few weeks. He has a long-standing history of an irregular pulse. He has no prior known arrhythmia disorder or coronary artery disease. A recent echo demonstrated normal left ventricular function with mild left atrial enlargement.  Thromboembolic risk factors are notable for hypertension, diabetes and age with a CHADS-VASc score of 4.    Past Medical History  Diagnosis Date  . Diabetes mellitus without complication   . Routine general medical examination at a health care facility   . Hypertension   . Hyperlipidemia   . GERD (gastroesophageal reflux disease)   . Anal fissure   . Knee pain   . Bradycardia   . Atrial fibrillation       Surgical History: No past surgical history on file.   Home Meds: Prior to Admission medications   Medication Sig Start Date End Date Taking? Authorizing Provider  amLODipine (NORVASC) 10 MG tablet Take 10 mg by mouth daily.    Historical Provider, MD  aspirin 81 MG tablet Take 81 mg by mouth daily.    Historical Provider, MD  citalopram (CELEXA) 10 MG tablet Take 10 mg by mouth daily.    Historical Provider, MD  Coenzyme Q10 (CO Q-10) 200 MG CAPS Take by mouth.    Historical Provider, MD  glucose blood test strip 1 each by Other route as needed for other. Use as instructed    Historical Provider, MD  hydrocortisone (ANUSOL-HC) 2.5 % rectal cream Place 1 application rectally 2 (two) times daily.    Historical Provider, MD  Incontinent Wash [facility] EX) Apply topically as  needed.    Historical Provider, MD  Lidocaine-Hydrocortisone Ace (ANAMANTLE HC) 3-0.5 % KIT Place rectally.    Historical Provider, MD  Multiple Vitamin (MULTIVITAMIN) capsule Take 1 capsule by mouth daily.    Historical Provider, MD  NON FORMULARY CVS lubricant eye drops qhs    Historical Provider, MD  NON FORMULARY Mura 5% eye ointment in right eye qhs    Historical Provider, MD  Omega 3 1200 MG CAPS Take 1,200 mg by mouth daily.    Historical Provider, MD  pimecrolimus (ELIDEL) 1 % cream Apply topically 2 (two) times daily.    Historical Provider, MD  Red Yeast Rice 600 MG CAPS Take by mouth.    Historical Provider, MD  Simethicone (GAS-X PO) Take by mouth.    Historical Provider, MD  sitaGLIPtin-metformin (JANUMET) 50-1000 MG per tablet Take 1 tablet by mouth 2 (two) times daily with a meal.    Historical Provider, MD  tamsulosin (FLOMAX) 0.4 MG CAPS capsule Take 0.4 mg by mouth.    Historical Provider, MD  valsartan (DIOVAN) 160 MG tablet Take 160 mg by mouth daily.    Historical Provider, MD  vitamin C (ASCORBIC ACID) 500 MG tablet Take 500 mg by mouth daily.    Historical Provider, MD    Inpatient Medications:  . amLODipine  10 mg Oral Daily  . citalopram  10 mg Oral Daily  . heparin  4,000 Units Intravenous Once  . irbesartan  150 mg Oral Daily  . [  START ON 05/31/2014] linagliptin  5 mg Oral Daily   And  . metFORMIN  1,000 mg Oral BID WC  . multivitamin  1 capsule Oral Daily  . Omega 3  1,200 mg Oral Daily  . sodium chloride  3 mL Intravenous Q12H  . tamsulosin  0.4 mg Oral QPC supper     Allergies: No Known Allergies  History   Social History  . Marital Status: Married    Spouse Name: N/A    Number of Children: 2  . Years of Education: N/A   Occupational History  . retired Optometrist    Social History Main Topics  . Smoking status: Former Research scientist (life sciences)  . Smokeless tobacco: Not on file  . Alcohol Use: Yes     Comment: one daily  . Drug Use: No  . Sexual Activity:  Not on file   Other Topics Concern  . Not on file   Social History Narrative  . No narrative on file     Family History  Problem Relation Age of Onset  . Heart attack Neg Hx      ROS:  Please see the history of present illness.     All other systems reviewed and negative.    Physical Exam: Blood pressure 144/57, pulse 78, temperature 98.3 F (36.8 C), temperature source Oral, resp. rate 18, height 5' 10.08" (1.78 m), weight 182 lb 1.6 oz (82.6 kg), SpO2 98.00%. General: Well developed, well nourished male in no acute distress. Head: Normocephalic, atraumatic, sclera non-icteric, no xanthomas, nares are without discharge. EENT: normal Lymph Nodes:  none Back: without scoliosis/kyphosis , no CVA tendersness Neck: Negative for carotid bruits. JVD not elevated. Lungs: Clear bilaterally to auscultation without wheezes, rales, or rhonchi. Breathing is unlabored. Heart: Irregularly irregular with S1 S2. NO  murmur , rubs, or gallops appreciated. Abdomen: Soft, non-tender, non-distended with normoactive bowel sounds. No hepatomegaly. No rebound/guarding. No obvious abdominal masses. Msk:  Strength and tone appear normal for age. Extremities: No clubbing or cyanosis.  Trace edema.  Distal pedal pulses are 2+ and equal bilaterally. Skin: Warm and Dry Neuro: Alert and oriented X 3. CN III-XII intact Grossly normal sensory and motor function . Psych:  Responds to questions appropriately with a normal affect.      Labs: Cardiac Enzymes No results found for this basename: CKTOTAL, CKMB, TROPONINI,  in the last 72 hours CBC Lab Results  Component Value Date   WBC 7.3 04/22/2008   HGB 13.4 04/22/2008   HCT 39.5 04/22/2008   MCV 97.3 04/22/2008   PLT 160 04/22/2008   PROTIME: No results found for this basename: LABPROT, INR,  in the last 72 hours Chemistry No results found for this basename: NA, K, CL, CO2, BUN, CREATININE, CALCIUM, LABALBU, PROT, BILITOT, ALKPHOS, ALT, AST, GLUCOSE,  in  the last 168 hours Lipids No results found for this basename: CHOL, HDL, LDLCALC, TRIG   BNP No results found for this basename: probnp   Miscellaneous Lab Results  Component Value Date   DDIMER  Value: 0.29        AT THE INHOUSE ESTABLISHED CUTOFF VALUE OF 0.48 ug/mL FEU, THIS ASSAY HAS BEEN DOCUMENTED IN THE LITERATURE TO HAVE 04/22/2008    Radiology/Studies:  No results found.  EKG: Cardionet is described but not available for reviw Cardionet monitor placed. This showed Atrial fibrillation with a slow ventricular response and pauses of 4.1 and 4.5 seconds  Assessment and Plan  PAFib  Sinus Brady  Symptomatic lightheadedness and  DOE  Normal LV function  Pt has symptomatic bradycardia with LH and DOE and orhtopnea .  This perhaps also assoc with pauses  It is appropiate at this point to undertake pacing The benefits and risks were reviewed including but not limited to death,  perforation, infection, lead dislodgement and device malfunction.  The patient understands agrees and is willing to proceed.  Anticoagulation is appropriately initiated following device implantation    Virl Axe

## 2014-05-30 NOTE — Telephone Encounter (Signed)
Received urgent report from cardionet, 05/30/14 @ 4:31am the pt had a 4.1 sec pause. 05/30/14 @ 5:22 am, the pt had a 4.5 sec pause. Reports reviewed with dr Tresa Endokelly. Called bed control and spoke with the card master at the hosp, pt will go to [place] to be admitted. Strips from cardionet faxed to [phone]. Patient voiced understanding

## 2014-05-31 ENCOUNTER — Encounter (HOSPITAL_COMMUNITY)
Admission: AD | Disposition: A | Discharge status: Home / Self Care | Payer: Self-pay | Source: Ambulatory Visit | Attending: Cardiovascular Disease

## 2014-05-31 DIAGNOSIS — I495 Sick sinus syndrome: Secondary | ICD-10-CM

## 2014-05-31 HISTORY — PX: PACEMAKER INSERTION: SHX728

## 2014-05-31 HISTORY — PX: PERMANENT PACEMAKER INSERTION: SHX5480

## 2014-05-31 LAB — GLUCOSE, CAPILLARY
GLUCOSE-CAPILLARY: 117 mg/dL — AB (ref 70–99)
GLUCOSE-CAPILLARY: 162 mg/dL — AB (ref 70–99)
Glucose-Capillary: 110 mg/dL — ABNORMAL HIGH (ref 70–99)
Glucose-Capillary: 185 mg/dL — ABNORMAL HIGH (ref 70–99)

## 2014-05-31 LAB — CBC
HCT: 37.7 % — ABNORMAL LOW (ref 39.0–52.0)
Hemoglobin: 12.3 g/dL — ABNORMAL LOW (ref 13.0–17.0)
MCH: 32.4 pg (ref 26.0–34.0)
MCHC: 32.6 g/dL (ref 30.0–36.0)
MCV: 99.2 fL (ref 78.0–100.0)
PLATELETS: 132 10*3/uL — AB (ref 150–400)
RBC: 3.8 MIL/uL — ABNORMAL LOW (ref 4.22–5.81)
RDW: 12.4 % (ref 11.5–15.5)
WBC: 6.8 10*3/uL (ref 4.0–10.5)

## 2014-05-31 LAB — HEMOGLOBIN A1C
HEMOGLOBIN A1C: 6.4 % — AB (ref <5.7)
MEAN PLASMA GLUCOSE: 137 mg/dL — AB (ref <117)

## 2014-05-31 LAB — HEPARIN LEVEL (UNFRACTIONATED): Heparin Unfractionated: 0.5 IU/mL (ref 0.30–0.70)

## 2014-05-31 SURGERY — PERMANENT PACEMAKER INSERTION
Anesthesia: LOCAL

## 2014-05-31 MED ORDER — FENTANYL CITRATE 0.05 MG/ML IJ SOLN
INTRAMUSCULAR | Status: AC
  Filled 2014-05-31: qty 2

## 2014-05-31 MED ORDER — CEFAZOLIN SODIUM 1-5 GM-% IV SOLN
1.0000 g | Freq: Four times a day (QID) | INTRAVENOUS | Status: AC
  Administered 2014-05-31 – 2014-06-01 (×3): 1 g via INTRAVENOUS
  Filled 2014-05-31 (×3): qty 50

## 2014-05-31 MED ORDER — MIDAZOLAM HCL 5 MG/5ML IJ SOLN
INTRAMUSCULAR | Status: AC
  Filled 2014-05-31: qty 5

## 2014-05-31 MED ORDER — HEPARIN (PORCINE) IN NACL 2-0.9 UNIT/ML-% IJ SOLN
INTRAMUSCULAR | Status: AC
  Filled 2014-05-31: qty 500

## 2014-05-31 MED ORDER — ACETAMINOPHEN 325 MG PO TABS
325.0000 mg | ORAL_TABLET | ORAL | Status: DC | PRN
Start: 2014-05-31 — End: 2014-06-01

## 2014-05-31 MED ORDER — SODIUM CHLORIDE 0.9 % IV SOLN: INTRAVENOUS | Status: AC

## 2014-05-31 MED ORDER — ONDANSETRON HCL 4 MG/2ML IJ SOLN: 4.0000 mg | Freq: Four times a day (QID) | INTRAMUSCULAR | Status: DC | PRN

## 2014-05-31 MED ORDER — LIDOCAINE HCL (PF) 1 % IJ SOLN
INTRAMUSCULAR | Status: AC
  Filled 2014-05-31: qty 60

## 2014-05-31 NOTE — Progress Notes (Signed)
Nurse notified by CCMD that patient converted to NSR, EKG obtained, MD notified, no new orders given at this time. Will continue to monitor patient.

## 2014-05-31 NOTE — H&P (Signed)
Chief Complaint: dizziness and bradycardia//paroysmal atrial fibrillation  HPI  Ross Morgan is a 80 y.o. male who was admitted with an abnormal cardionet monitor demonstrating symptomatic sinus bradycardia and placed because of symptoms of near syncope and increasing dyspnea on exertion. This has been notable over the last few weeks. He has a long-standing history of an irregular pulse. He has no prior known arrhythmia disorder or coronary artery disease. A recent echo demonstrated normal left ventricular function with mild left atrial enlargement.  Thromboembolic risk factors are notable for hypertension, diabetes and age with a CHADS-VASc score of 4.  Past Medical History   Diagnosis  Date   .  Diabetes mellitus without complication    .  Routine general medical examination at a health care facility    .  Hypertension    .  Hyperlipidemia    .  GERD (gastroesophageal reflux disease)    .  Anal fissure    .  Knee pain    .  Bradycardia    .  Atrial fibrillation     Surgical History: No past surgical history on file.  Home Meds:  Prior to Admission medications   Medication  Sig  Start Date  End Date  Taking?  Authorizing Provider   amLODipine (NORVASC) 10 MG tablet  Take 10 mg by mouth daily.     Historical Provider, MD   aspirin 81 MG tablet  Take 81 mg by mouth daily.     Historical Provider, MD   citalopram (CELEXA) 10 MG tablet  Take 10 mg by mouth daily.     Historical Provider, MD   Coenzyme Q10 (CO Q-10) 200 MG CAPS  Take by mouth.     Historical Provider, MD   glucose blood test strip  1 each by Other route as needed for other. Use as instructed     Historical Provider, MD   hydrocortisone (ANUSOL-HC) 2.5 % rectal cream  Place 1 application rectally 2 (two) times daily.     Historical Provider, MD   Incontinent Wash [facility] EX)  Apply topically as needed.     Historical Provider, MD   Lidocaine-Hydrocortisone Ace (ANAMANTLE HC) 3-0.5 % KIT  Place rectally.     Historical  Provider, MD   Multiple Vitamin (MULTIVITAMIN) capsule  Take 1 capsule by mouth daily.     Historical Provider, MD   NON FORMULARY  CVS lubricant eye drops qhs     Historical Provider, MD   NON FORMULARY  Mura 5% eye ointment in right eye qhs     Historical Provider, MD   Omega 3 1200 MG CAPS  Take 1,200 mg by mouth daily.     Historical Provider, MD   pimecrolimus (ELIDEL) 1 % cream  Apply topically 2 (two) times daily.     Historical Provider, MD   Red Yeast Rice 600 MG CAPS  Take by mouth.     Historical Provider, MD   Simethicone (GAS-X PO)  Take by mouth.     Historical Provider, MD   sitaGLIPtin-metformin (JANUMET) 50-1000 MG per tablet  Take 1 tablet by mouth 2 (two) times daily with a meal.     Historical Provider, MD   tamsulosin (FLOMAX) 0.4 MG CAPS capsule  Take 0.4 mg by mouth.     Historical Provider, MD   valsartan (DIOVAN) 160 MG tablet  Take 160 mg by mouth daily.     Historical Provider, MD   vitamin C (ASCORBIC ACID) 500 MG tablet  Take 500 mg by mouth daily.     Historical Provider, MD   Inpatient Medications:  .  amLODipine  10 mg  Oral  Daily   .  citalopram  10 mg  Oral  Daily   .  heparin  4,000 Units  Intravenous  Once   .  irbesartan  150 mg  Oral  Daily   .  [START ON 05/31/2014] linagliptin  5 mg  Oral  Daily    And   .  metFORMIN  1,000 mg  Oral  BID WC   .  multivitamin  1 capsule  Oral  Daily   .  Omega 3  1,200 mg  Oral  Daily   .  sodium chloride  3 mL  Intravenous  Q12H   .  tamsulosin  0.4 mg  Oral  QPC supper    Allergies: No Known Allergies  History    Social History   .  Marital Status:  Married     Spouse Name:  N/A     Number of Children:  2   .  Years of Education:  N/A    Occupational History   .  retired Optometrist     Social History Main Topics   .  Smoking status:  Former Research scientist (life sciences)   .  Smokeless tobacco:  Not on file   .  Alcohol Use:  Yes      Comment: one daily   .  Drug Use:  No   .  Sexual Activity:  Not on file    Other  Topics  Concern   .  Not on file    Social History Narrative   .  No narrative on file    Family History   Problem  Relation  Age of Onset   .  Heart attack  Neg Hx     ROS: Please see the history of present illness. All other systems reviewed and negative.  Physical Exam:  Blood pressure 144/57, pulse 78, temperature 98.3 F (36.8 C), temperature source Oral, resp. rate 18, height 5' 10.08" (1.78 m), weight 182 lb 1.6 oz (82.6 kg), SpO2 98.00%.  General: Well developed, well nourished elderly male in no acute distress.  Head: Normocephalic, atraumatic, sclera non-icteric, no xanthomas, nares are without discharge.  EENT: normal  Back: without scoliosis/kyphosis , no CVA tendersness  Neck: Negative for carotid bruits. JVD not elevated.  Lungs: Clear bilaterally to auscultation without wheezes, rales, or rhonchi. Breathing is unlabored.  Heart: Irregularly irregular with S1 S2. NO murmur , rubs, or gallops appreciated.  Abdomen: Soft, non-tender, non-distended with normoactive bowel sounds. No hepatomegaly. No rebound/guarding. No obvious abdominal masses.  Msk: Strength and tone appear normal for age.  Extremities: No clubbing or cyanosis. Trace edema. Distal pedal pulses are 2+ and equal bilaterally.  Skin: Warm and Dry  Neuro: Alert and oriented X 3. CN III-XII intact Grossly normal sensory and motor function .  Psych: Responds to questions appropriately with a normal affect.   Labs:  Cardiac Enzymes  No results found for this basename: CKTOTAL, CKMB, TROPONINI, in the last 72 hours  CBC  Lab Results   Component  Value  Date    WBC  7.3  04/22/2008    HGB  13.4  04/22/2008    HCT  39.5  04/22/2008    MCV  97.3  04/22/2008    PLT  160  04/22/2008    PROTIME:  No results found for this  basename: LABPROT, INR, in the last 72 hours  Chemistry No results found for this basename: NA, K, CL, CO2, BUN, CREATININE, CALCIUM, LABALBU, PROT, BILITOT, ALKPHOS, ALT, AST, GLUCOSE, in the last  168 hours  Lipids  No results found for this basename: CHOL, HDL, LDLCALC, TRIG    BNP  No results found for this basename: probnp    Miscellaneous  Lab Results   Component  Value  Date    DDIMER  Value: 0.29 AT THE INHOUSE ESTABLISHED CUTOFF VALUE OF 0.48 ug/mL FEU, THIS ASSAY HAS BEEN DOCUMENTED IN THE LITERATURE TO HAVE  04/22/2008    Radiology/Studies:  No results found.  EKG: Cardionet is described but not available for reviw  Cardionet monitor placed. This showed Atrial fibrillation with a slow ventricular response and pauses of 4.1 and 4.5 seconds  Assessment and Plan  PAFib  Sinus Loletha Grayer with heart rates in the 30's Symptomatic lightheadedness and DOE  Normal LV function  Rec: Pt has symptomatic bradycardia with near syncope and DOE and orthopnea . This perhaps also assoc with pauses  It is appropiate at this point to undertake pacing  The benefits and risks were reviewed including but not limited to death, perforation, infection, lead dislodgement and device malfunction. The patient understands agrees and is willing to proceed.  Anticoagulation is appropriately initiated following device implantation

## 2014-05-31 NOTE — Progress Notes (Signed)
ANTICOAGULATION CONSULT NOTE - Follow Up Consult  Pharmacy Consult for heparin Indication: atrial fibrillation  Labs:  Recent Labs  05/30/14 1656 05/31/14 0329  HGB 13.4 12.3*  HCT 40.8 37.7*  PLT 159 132*  APTT 25  --   LABPROT 13.4  --   INR 1.02  --   HEPARINUNFRC  --  0.50  CREATININE 1.12  --     Assessment/Plan:  80yo male therapeutic on heparin with initial dosing for Afib.  Scheduled for pacemaker today. Will continue gtt at current rate and confirm stable with additional level.   Ross Morgan, PharmD, BCPS  05/31/2014,4:53 AM

## 2014-05-31 NOTE — Progress Notes (Signed)
UR Completed Kel Senn Graves-Bigelow, RN,BSN [phone]  

## 2014-05-31 NOTE — CV Procedure (Signed)
SURGEON:  Lewayne BuntingGregg Taylor, MD     PREPROCEDURE DIAGNOSIS:  Symptomatic Bradycardia due to sinus node dysfunction    POSTPROCEDURE DIAGNOSIS:  Same as preprocedure diagnosis     PROCEDURES:   1. Left upper extremity venography.   2. Pacemaker implantation.     INTRODUCTION: Ross Morgan is a [age] y.o. male  with a history of bradycardia due to sinus node dysfunction who presents today for pacemaker implantation.  The patient reports intermittent episodes of dizziness over the past few months.  No reversible causes have been identified.  The patient therefore presents today for pacemaker implantation.     DESCRIPTION OF PROCEDURE:  Informed written consent was obtained, and   the patient was brought to the electrophysiology lab in a fasting state.  The patient required no sedation for the procedure today.  The patients left chest was prepped and draped in the usual sterile fashion by the EP lab staff. The skin overlying the left deltopectoral region was infiltrated with lidocaine for local analgesia.  A 4-cm incision was made over the left deltopectoral region.  A left subcutaneous pacemaker pocket was fashioned using a combination of sharp and blunt dissection. Electrocautery was required to assure hemostasis.    Left Upper Extremity Venography: A venogram of the left upper extremity was performed after initial attempts to puncture the subclavian vein were unsuccessful, revealing a large left cephalic vein, which emptied into a large left subclavian vein.  The left axillary vein was moderate in size.    RA/RV Lead Placement: The left axillary vein was therefore directly visualized and cannulated.  Through the left axillary vein, a St. Jude Model [id number] active fixation (serial number [id number]) right atrial lead and a St. Jude Model [id number] active fixation (serial number [id number]) right ventricular lead were advanced with fluoroscopic visualization into the right atrial appendage and right ventricular  apical septal positions respectively.  Initial atrial lead P- waves measured 3.6 mV with impedance of 480 ohms and a threshold of 0.4 V at 0.5 msec.  Right ventricular lead R-waves measured 9.2 mV with an impedance of 681 ohms and a threshold of 0.9 V at 0.5 msec.  Both leads were secured to the pectoralis fascia using #2-0 silk over the suture sleeves.   Device Placement:  The leads were then connected to a St. Jude DDD PM (serial number [id number]) pacemaker.  The pocket was irrigated with copious gentamicin solution.  The pacemaker was then placed into the pocket.  The pocket was then closed in 2 layers with 2.0 Vicryl suture for the subcutaneous and subcuticular layers.  Steri-Strips and a sterile dressing were then applied.  There were no early apparent complications.     CONCLUSIONS:   1. Successful implantation of a St. Jude dual-chamber pacemaker for symptomatic bradycardia due to sinus node dysfunction  2. No early apparent complications.           Lewayne BuntingGregg Taylor, MD 05/31/2014 5:48 PM

## 2014-05-31 NOTE — Care Management Note (Addendum)
  Page 1 of 1   06/01/2014     10:46:43 AM CARE MANAGEMENT NOTE 06/01/2014  Patient:  Ross Morgan,Ross Morgan   Account Number:  [id number]  Date Initiated:  05/31/2014  Documentation initiated by:  GRAVES-BIGELOW,Fenix Ruppe  Subjective/Objective Assessment:   Pt admitted for  dizziness and bradycardia//paroysmal atrial fibrillation. Plan for pacemaker.     Action/Plan:   CM to monitor for disposition needs.   Anticipated DC Date:  06/02/2014   Anticipated DC Plan:  HOME W HOME HEALTH SERVICES      DC Planning Services  CM consult      Choice offered to / List presented to:             Status of service:  Completed, signed off Medicare Important Message given?  NA - LOS <3 / Initial given by admissions (If response is "NO", the following Medicare IM given date fields will be blank) Date Medicare IM given:   Medicare IM given by:   Date Additional Medicare IM given:   Additional Medicare IM given by:    Discharge Disposition:  HOME/SELF CARE  Per UR Regulation:  Reviewed for med. necessity/level of care/duration of stay  If discussed at Long Length of Stay Meetings, dates discussed:    Comments:  06-01-14 [address] Graves-Bigelow, RN,BSN [phone] CM did provide pt with 30 day free eliquis card. CM did call [facility] and pt has a prior authorization that needs to be called in for eliquis. CM provided Staff RN to provide # to MD to call in @ [phone]. No further needs from CM at this time.

## 2014-06-01 ENCOUNTER — Inpatient Hospital Stay (HOSPITAL_COMMUNITY): Payer: Medicare Other

## 2014-06-01 DIAGNOSIS — E119 Type 2 diabetes mellitus without complications: Secondary | ICD-10-CM | POA: Diagnosis present

## 2014-06-01 LAB — CBC
HEMATOCRIT: 37.4 % — AB (ref 39.0–52.0)
HEMOGLOBIN: 12.4 g/dL — AB (ref 13.0–17.0)
MCH: 32.5 pg (ref 26.0–34.0)
MCHC: 33.2 g/dL (ref 30.0–36.0)
MCV: 97.9 fL (ref 78.0–100.0)
Platelets: 133 10*3/uL — ABNORMAL LOW (ref 150–400)
RBC: 3.82 MIL/uL — ABNORMAL LOW (ref 4.22–5.81)
RDW: 12.2 % (ref 11.5–15.5)
WBC: 8.1 10*3/uL (ref 4.0–10.5)

## 2014-06-01 LAB — GLUCOSE, CAPILLARY: GLUCOSE-CAPILLARY: 131 mg/dL — AB (ref 70–99)

## 2014-06-01 MED ORDER — APIXABAN 5 MG PO TABS
5.0000 mg | ORAL_TABLET | Freq: Two times a day (BID) | ORAL | Status: DC
Start: 2014-06-02 — End: 2015-05-18

## 2014-06-01 NOTE — Discharge Summary (Signed)
CARDIOLOGY DISCHARGE SUMMARY   Patient ID: Ross Morgan MRN: [id number] DOB/AGE: 78/24/1929 78 y.o.  Admit date: 05/30/2014 Discharge date: 06/01/2014  PCP: Marton Redwood, MD Primary Cardiologist: JB/SK  Primary Discharge Diagnosis:   Atrial fibrillation, slow VR and pauses - dual-lead St. Jude DDD PM (serial number [id number]) insert  Secondary Discharge Diagnosis:    Essential hypertension   Bradycardia, sinus   Diabetes  Consults: Electrophysiology  Procedures: Upper extremity venography, pacemaker insertion  Hospital Course: Ross Morgan is a 80 y.o. male with no history of CAD. He was referred for evaluation of bradycardia. A CardioNet monitor showed atrial fibrillation with slow ventricular response and pauses up to 4.5 seconds. He came to the hospital for admission and an EP consult was called.  He was seen by Dr. Caryl Comes who reviewed all data and recommended a permanent pacemaker. He is also an anticoagulation candidate and this will be started after the procedure.  A St. Jude dual lead permanent pacemaker was inserted after an upper extremity venogram, without complication.  On 07/22, he was seen by [name] and all data were reviewed. His post-procedure chest x-ray showed no complication. His device interrogation showed normal function. An ECG shows an atrial paced rhythm. [name] is doing well and no further inpatient workup is indicated. He is considered stable for discharge, to follow up as an outpatient.  Prior authorization was obtained for Eliquis with his insurance company.  Labs:   Lab Results  Component Value Date   WBC 8.1 06/01/2014   HGB 12.4* 06/01/2014   HCT 37.4* 06/01/2014   MCV 97.9 06/01/2014   PLT 133* 06/01/2014     Recent Labs Lab 05/30/14 1656  NA 139  K 4.2  CL 103  CO2 23  BUN 22  CREATININE 1.12  CALCIUM 9.2  PROT 7.2  BILITOT 0.2*  ALKPHOS 65  ALT 13  AST 15  GLUCOSE 107*    Recent Labs  05/30/14 1656  INR 1.02        Radiology: Dg Chest 2 View 06/01/2014   CLINICAL DATA:  Weakness.  EXAM: CHEST  2 VIEW  COMPARISON:  05/30/2014.  FINDINGS: Mediastinum and hilar structures normal. Cardiomegaly with normal pulmonary vascularity. Cardiac pacer with lead tips in the right atrium and right ventricle. No pleural effusion or pneumothorax. No acute bony abnormality .  IMPRESSION: 1. No acute cardiopulmonary disease. 2. Cardiomegaly. No pulmonary venous congestion. Cardiac pacer noted with lead tips in right atrium and right ventricle.   Electronically Signed   By: Marcello Moores  Register   On: 06/01/2014 08:04   X-ray Chest Pa And Lateral 05/30/2014   CLINICAL DATA:  Atrial fibrillation. Preprocedural study prior to pacemaker placement.  EXAM: CHEST  2 VIEW  COMPARISON:  No priors.  FINDINGS: Lung volumes are normal. No consolidative airspace disease. No pleural effusions. No pneumothorax. No pulmonary nodule or mass noted. Pulmonary vasculature and the cardiomediastinal silhouette are within normal limits.  IMPRESSION: No radiographic evidence of acute cardiopulmonary disease.   Electronically Signed   By: Vinnie Langton M.D.   On: 05/30/2014 19:30   Venography with pacemaker implantation: 05/31/2014 Left Upper Extremity Venography:  A venogram of the left upper extremity was performed after initial attempts to puncture the subclavian vein were unsuccessful, revealing a large left cephalic vein, which emptied into a large left subclavian vein. The left axillary vein was moderate in size.  RA/RV Lead Placement:  The left axillary vein was therefore directly visualized and  cannulated. Through the left axillary vein, a St. Jude Model [id number] active fixation (serial number [id number]) right atrial lead and a St. Jude Model [id number] active fixation (serial number [id number]) right ventricular lead were advanced with fluoroscopic visualization into the right atrial appendage and right ventricular apical septal positions respectively.  Initial atrial lead P- waves measured 3.6 mV with impedance of 480 ohms and a threshold of 0.4 V at 0.5 msec. Right ventricular lead R-waves measured 9.2 mV with an impedance of 681 ohms and a threshold of 0.9 V at 0.5 msec. Both leads were secured to the pectoralis fascia using #2-0 silk over the suture sleeves.  Device Placement:  The leads were then connected to a St. Jude DDD PM (serial number [id number]) pacemaker. The pocket was irrigated with copious gentamicin solution. The pacemaker was then placed into the pocket. The pocket was then closed in 2 layers with 2.0 Vicryl suture for the subcutaneous and subcuticular layers. Steri-Strips and a sterile dressing were then applied. There were no early apparent complications.  CONCLUSIONS:  1. Successful implantation of a St. Jude dual-chamber pacemaker for symptomatic bradycardia due to sinus node dysfunction  2. No early apparent complications.   EKG: 06/01/2014 Atrial pacing, no acute changes Vent. rate 60 BPM PR interval * ms QRS duration 88 ms QT/QTc 426/426 ms P-R-T axes * -4 57  FOLLOW UP PLANS AND APPOINTMENTS No Known Allergies   Medication List    STOP taking these medications       aspirin-sod bicarb-citric acid 325 MG Tbef tablet  Commonly known as:  ALKA-SELTZER      TAKE these medications       amLODipine 10 MG tablet  Commonly known as:  NORVASC  Take 10 mg by mouth daily.     ANAMANTLE HC 3-0.5 % Kit  Generic drug:  Lidocaine-Hydrocortisone Ace  Place 1 application rectally daily as needed (for irritation).     apixaban 5 MG Tabs tablet  Commonly known as:  ELIQUIS  Take 1 tablet (5 mg total) by mouth 2 (two) times daily.  Start taking on:  06/02/2014     aspirin 81 MG tablet  Take 81 mg by mouth daily.     BALNEOL EX  Apply topically as needed.     citalopram 10 MG tablet  Commonly known as:  CELEXA  Take 10 mg by mouth daily.     Co Q-10 200 MG Caps  Take 1 capsule by mouth daily.     FIBER PO   Take 1 each by mouth 2 (two) times daily.     FLOMAX 0.4 MG Caps capsule  Generic drug:  tamsulosin  Take 0.4 mg by mouth.     fluorouracil 5 % cream  Commonly known as:  EFUDEX  Apply 1 application topically daily as needed (for irritation).     GAS-X PO  Take 1 tablet by mouth daily as needed (for gas relief).     glucose blood test strip  1 each by Other route as needed for other. Use as instructed     hydrocortisone 2.5 % rectal cream  Commonly known as:  ANUSOL-HC  Place 1 application rectally 2 (two) times daily.     multivitamin capsule  Take 1 capsule by mouth daily.     NON FORMULARY  Place 1 drop into the left eye at bedtime. [facility] lubricant eye drops qhs     NON FORMULARY  Place 1 application into the right eye at bedtime. Mura 5%  eye ointment in right eye qhs     Omega 3 1200 MG Caps  Take 1,200 mg by mouth daily.     pimecrolimus 1 % cream  Commonly known as:  ELIDEL  Apply topically 2 (two) times daily.     Red Yeast Rice 600 MG Caps  Take 1,200 mg by mouth 2 (two) times daily.     sitaGLIPtin-metformin 50-1000 MG per tablet  Commonly known as:  JANUMET  Take 1 tablet by mouth 2 (two) times daily with a meal.     valsartan 160 MG tablet  Commonly known as:  DIOVAN  Take 160 mg by mouth daily.     vitamin C 500 MG tablet  Commonly known as:  ASCORBIC ACID  Take 500 mg by mouth daily.        Discharge Instructions   Diet - low sodium heart healthy    Complete by:  As directed      Diet Carb Modified    Complete by:  As directed      Increase activity slowly    Complete by:  As directed           Follow-up Information   Follow up with Lorretta Harp, MD On 06/08/2014. (Device check, wound check and office visit at 11:45 am)    Specialty:  Cardiology   Contact information:   [address] [place] [address] [place] [address] [phone]       BRING ALL MEDICATIONS WITH YOU TO FOLLOW UP APPOINTMENTS  Time spent with patient to  include physician time: 43 min Signed: Rosaria Ferries, PA-C 06/01/2014, 11:29 AM Co-Sign MD  EP Attending  Patient seen and examined. Agree with above.  Cristopher Peru, M.D.

## 2014-06-01 NOTE — Progress Notes (Signed)
Patient ID: Langston Tuberville, male   DOB: [birth year], 80 y.o.   MRN: [id number]   Patient Name: Ross Morgan Date of Encounter: 06/01/2014     Active Problems:   Atrial fibrillation    SUBJECTIVE Denies chest pain or sob  CURRENT MEDS . amLODipine  10 mg Oral Daily  . citalopram  10 mg Oral Daily  . irbesartan  150 mg Oral Daily  . linagliptin  5 mg Oral Daily   And  . metFORMIN  1,000 mg Oral BID WC  . multivitamin with minerals  1 tablet Oral Daily  . omega-3 acid ethyl esters  1 g Oral Daily  . sodium chloride  3 mL Intravenous Q12H  . tamsulosin  0.4 mg Oral QPC supper    OBJECTIVE  Filed Vitals:   05/31/14 1400 05/31/14 1500 05/31/14 2055 06/01/14 0529  BP: 148/77 154/61 157/61 156/67  Pulse: 60 60 60 63  Temp:   98.1 F (36.7 C) 98.7 F (37.1 C)  TempSrc:      Resp:   16 16  Height:      Weight:      SpO2: 98% 98% 96% 92%    Intake/Output Summary (Last 24 hours) at 06/01/14 0912 Last data filed at 05/31/14 1500  Gross per 24 hour  Intake    240 ml  Output      0 ml  Net    240 ml   Filed Weights   05/30/14 1500 05/31/14 0619  Weight: 182 lb 1.6 oz (82.6 kg) 179 lb 9.2 oz (81.455 kg)    PHYSICAL EXAM  General: Pleasant, elderly man, NAD. Neuro: Alert and oriented X 3. Moves all extremities spontaneously. Psych: Normal affect. HEENT:  Normal  Neck: Supple without bruits or JVD. Lungs:  Resp regular and unlabored, CTA. Heart: RRR no s3, s4, or murmurs. Abdomen: Soft, non-tender, non-distended, BS + x 4.  Extremities: No clubbing, cyanosis or edema. DP/PT/Radials 2+ and equal bilaterally.  Accessory Clinical Findings  CBC  Recent Labs  05/30/14 1656 05/31/14 0329 06/01/14 0444  WBC 7.8 6.8 8.1  NEUTROABS 4.0  --   --   HGB 13.4 12.3* 12.4*  HCT 40.8 37.7* 37.4*  MCV 99.0 99.2 97.9  PLT 159 132* 133*   Basic Metabolic Panel  Recent Labs  05/30/14 1656  NA 139  K 4.2  CL 103  CO2 23  GLUCOSE 107*  BUN 22  CREATININE 1.12   CALCIUM 9.2  MG 1.9   Liver Function Tests  Recent Labs  05/30/14 1656  AST 15  ALT 13  ALKPHOS 65  BILITOT 0.2*  PROT 7.2  ALBUMIN 3.8   No results found for this basename: LIPASE, AMYLASE,  in the last 72 hours Cardiac Enzymes No results found for this basename: CKTOTAL, CKMB, CKMBINDEX, TROPONINI,  in the last 72 hours BNP No components found with this basename: POCBNP,  D-Dimer No results found for this basename: DDIMER,  in the last 72 hours Hemoglobin A1C  Recent Labs  05/30/14 1656  HGBA1C 6.4*   Fasting Lipid Panel No results found for this basename: CHOL, HDL, LDLCALC, TRIG, CHOLHDL, LDLDIRECT,  in the last 72 hours Thyroid Function Tests  Recent Labs  05/30/14 1656  TSH 5.310*    TELE Atrial pacing  Device interogation - normal St. Jude DDD PM function  Radiology/Studies  Dg Chest 2 View  06/01/2014   CLINICAL DATA:  Weakness.  EXAM: CHEST  2 VIEW  COMPARISON:  05/30/2014.  FINDINGS: Mediastinum and hilar structures normal. Cardiomegaly with normal pulmonary vascularity. Cardiac pacer with lead tips in the right atrium and right ventricle. No pleural effusion or pneumothorax. No acute bony abnormality .  IMPRESSION: 1. No acute cardiopulmonary disease. 2. Cardiomegaly. No pulmonary venous congestion. Cardiac pacer noted with lead tips in right atrium and right ventricle.   Electronically Signed   By: Maisie Fushomas  Register   On: 06/01/2014 08:04   X-ray Chest Pa And Lateral  05/30/2014   CLINICAL DATA:  Atrial fibrillation. Preprocedural study prior to pacemaker placement.  EXAM: CHEST  2 VIEW  COMPARISON:  No priors.  FINDINGS: Lung volumes are normal. No consolidative airspace disease. No pleural effusions. No pneumothorax. No pulmonary nodule or mass noted. Pulmonary vasculature and the cardiomediastinal silhouette are within normal limits.  IMPRESSION: No radiographic evidence of acute cardiopulmonary disease.   Electronically Signed   By: Trudie Reedaniel  Entrikin  M.D.   On: 05/30/2014 19:30    ASSESSMENT AND PLAN 1. Symptomatic bradycardia due to sinus node dysfunction 2. S/p PPM insertion Rec: ok for discharge home as his device is working normally. Usual followup.   Yvetta Drotar,M.D.  06/01/2014 9:12 AM

## 2014-06-01 NOTE — Discharge Instructions (Signed)
° °  Supplemental Discharge Instructions for  Pacemaker/Defibrillator Patients  Activity No heavy lifting or vigorous activity with your left/right arm for 6 to 8 weeks.  Do not raise your left/right arm above your head for one week.  Gradually raise your affected arm as drawn below.                           07/25                      07/26                        07/27                    07/28            NO DRIVING for  1 week    ; you may begin driving on     69/6207/28     . WOUND CARE   Keep the wound area clean and dry.  Do not get this area wet for one week. No showers for one week; you may shower on      07/28        .   The tape/steri-strips on your wound will fall off; do not pull them off.  No bandage is needed on the site.  DO  NOT apply any creams, oils, or ointments to the wound area.   If you notice any drainage or discharge from the wound, any swelling or bruising at the site, or you develop a fever > 101? F after you are discharged home, call the office at once.  Special Instructions   You are still able to use cellular telephones; use the ear opposite the side where you have your pacemaker/defibrillator.  Avoid carrying your cellular phone near your device.   When traveling through airports, show security personnel your identification card to avoid being screened in the metal detectors.  Ask the security personnel to use the hand wand.   Avoid arc welding equipment, MRI testing (magnetic resonance imaging), TENS units (transcutaneous nerve stimulators).  Call the office for questions about other devices.   Avoid electrical appliances that are in poor condition or are not properly grounded.   Microwave ovens are safe to be near or to operate.  Additional information for defibrillator patients should your device go off:   If your device goes off ONCE and you feel fine afterward, notify the device clinic nurses.   If your device goes off ONCE and you do not feel well afterward, call  911.   If your device goes off TWICE, call 911.   If your device goes off THREE times in one day, call 911.  DO NOT DRIVE YOURSELF OR A FAMILY MEMBER WITH A DEFIBRILLATOR TO THE HOSPITAL--CALL 911.

## 2014-06-02 ENCOUNTER — Telehealth: Payer: Self-pay

## 2014-06-02 ENCOUNTER — Telehealth: Payer: Self-pay | Admitting: Cardiovascular Disease

## 2014-06-02 NOTE — Telephone Encounter (Signed)
Wife is wearing a 10 units and he just had a pacemaker put in on Tuesday. Please call to advise,because she will not wear it,because she thinks he will be in danger.

## 2014-06-02 NOTE — Telephone Encounter (Signed)
Let pt know its okay to put a Tens Unit on his wife as long as stimulation is not being delivered while placing the unit. Pt expressed understanding.

## 2014-06-02 NOTE — Telephone Encounter (Signed)
Prior authorization for Eliquis has been approved through 06/02/2015.

## 2014-06-02 NOTE — Telephone Encounter (Signed)
Forward to device pool Please call patient with information

## 2014-06-05 ENCOUNTER — Encounter (HOSPITAL_COMMUNITY): Payer: Self-pay | Admitting: *Deleted

## 2014-06-07 ENCOUNTER — Ambulatory Visit: Payer: Medicare Other | Admitting: Cardiovascular Disease

## 2014-06-07 ENCOUNTER — Encounter: Payer: Self-pay | Admitting: Cardiovascular Disease

## 2014-06-08 ENCOUNTER — Encounter: Payer: Self-pay | Admitting: Cardiovascular Disease

## 2014-06-08 ENCOUNTER — Ambulatory Visit (INDEPENDENT_AMBULATORY_CARE_PROVIDER_SITE_OTHER): Payer: Medicare Other | Admitting: Cardiovascular Disease

## 2014-06-08 VITALS — BP 130/60 | HR 64 | Ht 70.0 in | Wt 181.3 lb

## 2014-06-08 DIAGNOSIS — I4891 Unspecified atrial fibrillation: Secondary | ICD-10-CM

## 2014-06-08 DIAGNOSIS — I10 Essential (primary) hypertension: Secondary | ICD-10-CM

## 2014-06-08 DIAGNOSIS — I482 Chronic atrial fibrillation, unspecified: Secondary | ICD-10-CM

## 2014-06-08 DIAGNOSIS — E785 Hyperlipidemia, unspecified: Secondary | ICD-10-CM

## 2014-06-08 NOTE — Patient Instructions (Addendum)
Your physician wants you to follow-up in: 6 months. You will receive a reminder letter in the mail two months in advance. If you don't receive a letter, please call our office to schedule the follow-up appointment.  Your physician recommends that you schedule a follow-up appointment Jun 13 2014 at 2pm - [facility]  Please go to  [address]    [address].  3RD FLOOR

## 2014-06-08 NOTE — Assessment & Plan Note (Signed)
On statin therapy followed by his PCP 

## 2014-06-08 NOTE — Assessment & Plan Note (Signed)
Controlled on current medications 

## 2014-06-08 NOTE — Progress Notes (Signed)
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 He has a history of hypertension medically treated as well as hyperlipidemia. There is no other cardiovascular risk factor history. He has had bradycardia in the past. He says his blood pressure at home. He's had recent heart rates in the 30s and 40s with excessive fatigue and dizziness which is orthostatic. He denies chest pain but does have dyspnea on exertion. He had an event monitor that showed significant bradycardia and ultimately was admitted and underwent permanent pacemaker insertion by Dr. Lovena Morgan on 05/31/14. He had a St. Jude DDD permanent pacemaker (serial [id number]) inserted. He had a 2-D echocardiogram performed on 05/06/14 which was essentially normal. His CHA2DSVASC score was 4 and he was properly anticoagulated with a novel oral anticoagulant. He feels symptomatically improved since pacemaker insertion.    Current Outpatient Prescriptions  Medication Sig Dispense Refill  . amLODipine (NORVASC) 10 MG tablet Take 10 mg by mouth daily.      [name] apixaban (ELIQUIS) 5 MG TABS tablet Take 1 tablet (5 mg total) by mouth 2 (two) times daily.  60 tablet  11  . aspirin 81 MG tablet Take 81 mg by mouth daily.      . citalopram (CELEXA) 10 MG tablet Take 10 mg by mouth daily.      . Coenzyme Q10 (CO Q-10) 200 MG CAPS Take 1 capsule by mouth daily.       [name] FIBER PO Take 1 each by mouth 2 (two) times daily.      . fluorouracil (EFUDEX) 5 % cream Apply 1 application topically daily as needed (for irritation).      [name] glucose blood test strip 1 each by Other route as needed for other. Use as instructed      . hydrocortisone (ANUSOL-HC) 2.5 % rectal  cream Place 1 application rectally 2 (two) times daily.      . Lidocaine-Hydrocortisone Ace (ANAMANTLE HC) 3-0.5 % KIT Place 1 application rectally daily as needed (for irritation).       . Multiple Vitamin (MULTIVITAMIN) capsule Take 1 capsule by mouth daily.      . NON FORMULARY Place 1 drop into the left eye at bedtime. [employer] lubricant eye drops qhs      . NON FORMULARY Place 1 application into the right eye at bedtime. Mura 5% eye ointment in right eye qhs      . Omega 3 1200 MG CAPS Take 1,200 mg by mouth daily.      . pimecrolimus (ELIDEL) 1 % cream Apply topically 2 (two) times daily.      . Red Yeast Rice 600 MG CAPS Take 1,200 mg by mouth 2 (two) times daily.       . Simethicone (GAS-X PO) Take 1 tablet by mouth daily as needed (for gas relief).       . sitaGLIPtin-metformin (JANUMET) 50-1000 MG per tablet Take 1 tablet by mouth 2 (two) times daily with a meal.      . tamsulosin (FLOMAX) 0.4 MG CAPS capsule Take 0.4 mg by mouth.      . valsartan (DIOVAN) 160 MG tablet Take 160 mg by mouth daily.      . vitamin C (ASCORBIC ACID) 500  MG tablet Take 500 mg by mouth daily.       No current facility-administered medications for this visit.    No Known Allergies  History   Social History  . Marital Status: Married    Spouse Name: N/A    Number of Children: 2  . Years of Education: N/A   Occupational History  . retired Optometrist    Social History Main Topics  . Smoking status: Former Smoker -- 1 years    Types: Cigarettes  . Smokeless tobacco: Never Used     Comment: "quit smoking in ~ 1952  . Alcohol Use: Yes     Comment: 05/30/2014 "glass of wine ~ once/month"  . Drug Use: No  . Sexual Activity: Yes   Other Topics Concern  . Not on file   Social History Narrative  . No narrative on file     Review of Systems: General: negative for chills, fever, night sweats or weight changes.  Cardiovascular: negative for chest pain, dyspnea on exertion, edema, orthopnea,  palpitations, paroxysmal nocturnal dyspnea or shortness of breath Dermatological: negative for rash Respiratory: negative for cough or wheezing Urologic: negative for hematuria Abdominal: negative for nausea, vomiting, diarrhea, bright red blood per rectum, melena, or hematemesis Neurologic: negative for visual changes, syncope, or dizziness All other systems reviewed and are otherwise negative except as noted above.    Blood pressure 130/60, pulse 64, height $RemoveBe'5\' 10"'geKZfcvhP$  (1.778 m), weight 181 lb 4.8 oz (82.237 kg).  General appearance: alert and no distress Neck: no adenopathy, no carotid bruit, no JVD, supple, symmetrical, trachea midline and thyroid not enlarged, symmetric, no tenderness/mass/nodules Lungs: clear to auscultation bilaterally Heart: regular rate and rhythm, S1, S2 normal, no murmur, click, rub or gallop Extremities: extremities normal, atraumatic, no cyanosis or edema  EKG not performed today  ASSESSMENT AND PLAN:   Essential hypertension Controlled on current medications  Hyperlipidemia On statin therapy followed by his PCP  Atrial fibrillation, slow VR and pauses The patient recently underwent dual-chamber pacemaker insertion by Dr. Crissie Morgan on 05/31/14. Is a St. Jude DDD permanent pacemaker (serial number [id number]). He symptomatically improved and no longer has dizziness. The pacer site appears well-healed. Followup with Dr. Lovena Morgan will be scheduled.      Ross Morgan FACP,FACC,FAHA, [facility] 06/08/2014 12:24 PM

## 2014-06-08 NOTE — Assessment & Plan Note (Signed)
The patient recently underwent dual-chamber pacemaker insertion by Dr. Sharrell KuGreg Taylor on 05/31/14. Is a St. Jude DDD permanent pacemaker (serial number [id number]). He symptomatically improved and no longer has dizziness. The pacer site appears well-healed. Followup with Dr. Ladona Ridgelaylor will be scheduled.

## 2014-06-09 ENCOUNTER — Ambulatory Visit (INDEPENDENT_AMBULATORY_CARE_PROVIDER_SITE_OTHER): Payer: Medicare Other | Admitting: General Surgery

## 2014-06-09 ENCOUNTER — Encounter (INDEPENDENT_AMBULATORY_CARE_PROVIDER_SITE_OTHER): Payer: Self-pay | Admitting: General Surgery

## 2014-06-09 VITALS — BP 130/70 | HR 62 | Temp 98.0°F | Ht 70.0 in | Wt 179.0 lb

## 2014-06-09 DIAGNOSIS — K648 Other hemorrhoids: Secondary | ICD-10-CM

## 2014-06-09 NOTE — Patient Instructions (Signed)
Continue to drink plenty of water and slowly work up to a total of 25 grams of fiber a day in your diet thru food and supplemental fiber.  i will refer you to see my partner Dr Jolene Provosthomas  Fiber Chart  You should 25-30g of fiber per day and drinking 8 glasses of water to help your bowels move regularly.  In the chart below you can look up how much fiber you are getting in an average day.  If you are not getting enough fiber, you should add a fiber supplement to your diet.  Examples of this include Metamucil, FiberCon and Citrucel.  These can be purchased at your local grocery store or pharmacy.      [email or web address]

## 2014-06-09 NOTE — Progress Notes (Signed)
Subjective:     Patient ID: Ross Morgan, male   DOB: 1928-07-17, 80 y.o.   MRN: [id number]  HPI 80 year old gentleman who comes in for followup pain with defecation. I initially saw him a few weeks ago. He was initially billed as an anal fissure but on visual exam some of his symptoms were more consistent with pruritus ani. He also was not taking any supplemental fiber. Unfortunately since his last visit he became extremely bradycardic and underwent emergency pacemaker insertion within the past 2 weeks. He is now on a daily blood thinner, eliquis. He states that he did have a large episode of bleeding on July 4 prior to being on a blood thinner. He states that it still feels like pushing a square pegs thru a round hole when he has a bowel movement. He denies that it feels like passing shards of glass or been cut with a knife.[name]. He did try Benefiber about 1-1/2 teaspoons and had cramps. He then had a severe case of diarrhea with incontinence. He has not had any additional episodes of incontinence. He is now taking Fiber gummies one twice a day for about a total of 8 g of fiber supplement per day. He is still drinking plenty of water. He is still applying lidocaine-hydrocortisone ointment every other day. He does have some burning after a bowel movement  PMHx, PSHx, SOCHx, FAMHx, ALL reviewed    Review of Systems 8 point ROS performed and negative    Objective:   Physical Exam  Constitutional: He appears well-developed and well-nourished. No distress.  HENT:  Head: Normocephalic and atraumatic.  Right Ear: External ear normal.  Left Ear: External ear normal.  Eyes: Conjunctivae are normal. No scleral icterus.  Cardiovascular: Normal rate.   Pulmonary/Chest: Effort normal. No respiratory distress.    Genitourinary: Rectal exam shows no external hemorrhoid.  Visual inspection of his perianal region reveals some hyperpigmented skin circumferentially. In the left lateral position there is a small  nonthrombosed redundant external hemorrhoids. I do not believe is consistent with a sentinel pile. Digital rectal exam reveals decent tone. He is able to push out my finger. Anoscopy reveals no overt sign of a fissure. He does have some enlarged internal hemorrhoids. The anoderm and first 2 cm of the anus has hypopigmented skin and mucosa  Skin: He is not diaphoretic.   BP 130/70  Pulse 62  Temp(Src) 98 F (36.7 C)  Ht 5\' 10"  (1.778 m)  Wt 179 lb (81.194 kg)  BMI 25.68 kg/m2     Assessment:     Bleeding internal hemorrhoids     Plan:     I really didn't appreciate any anal fissure. He does have some internal hemorrhoids. The hypopigmented skin along with the hypopigmented mucosa is a little atypical. It could be due to chronic steroid cream use. I am going to send him to our colorectal surgeon in our office for second opinion. In the interim advised him to continue slowly increasing his fiber to a total of around 25 g a day. I reminded him to slowly increase the fiber in order to avoid bloating and cramping. I also encouraged him to discuss with his cardiologist how long the duration of his blood thinner maybe.  Refer to Dr. Hector Morgan  Ross Morgan M. Ross CampanileWilson, MD, FACS General, Bariatric, & Minimally Invasive Surgery [facility]  Note: This dictation was prepared with Dragon/digital dictation along with [facility] technology. Any transcriptional errors that result from this process are unintentional.

## 2014-06-13 ENCOUNTER — Ambulatory Visit (INDEPENDENT_AMBULATORY_CARE_PROVIDER_SITE_OTHER): Payer: Medicare Other | Admitting: *Deleted

## 2014-06-13 DIAGNOSIS — R001 Bradycardia, unspecified: Secondary | ICD-10-CM

## 2014-06-13 DIAGNOSIS — I4891 Unspecified atrial fibrillation: Secondary | ICD-10-CM

## 2014-06-13 DIAGNOSIS — I480 Paroxysmal atrial fibrillation: Secondary | ICD-10-CM

## 2014-06-13 DIAGNOSIS — I498 Other specified cardiac arrhythmias: Secondary | ICD-10-CM

## 2014-06-13 LAB — MDC_IDC_ENUM_SESS_TYPE_INCLINIC
Battery Voltage: 3.04 V
Brady Statistic RV Percent Paced: 23 %
Implantable Pulse Generator Model: 2240
Implantable Pulse Generator Serial Number: 7643490
Lead Channel Impedance Value: 462.5 Ohm
Lead Channel Pacing Threshold Amplitude: 0.75 V
Lead Channel Pacing Threshold Amplitude: 1 V
Lead Channel Pacing Threshold Pulse Width: 0.4 ms
Lead Channel Sensing Intrinsic Amplitude: 4.3 mV
Lead Channel Sensing Intrinsic Amplitude: 8.8 mV
Lead Channel Setting Pacing Amplitude: 3.5 V
Lead Channel Setting Pacing Amplitude: 3.5 V
Lead Channel Setting Pacing Pulse Width: 0.4 ms
Lead Channel Setting Sensing Sensitivity: 2 mV
MDC IDC MSMT BATTERY REMAINING LONGEVITY: 88.8 mo
MDC IDC MSMT LEADCHNL RA IMPEDANCE VALUE: 512.5 Ohm
MDC IDC MSMT LEADCHNL RA PACING THRESHOLD PULSEWIDTH: 0.4 ms
MDC IDC SESS DTM: 20150803140902
MDC IDC STAT BRADY RA PERCENT PACED: 81 %

## 2014-06-13 NOTE — Progress Notes (Signed)
Wound check appointment. Steri-strips removed. Wound without redness or edema. Incision edges approximated, wound well healed. Normal device function. Thresholds, sensing, and impedances consistent with implant measurements. Device programmed at 3.5V/auto capture programmed on for extra safety margin until 3 month visit. Histogram distribution appropriate for patient and level of activity. 2 mode switches--longest was 12 seconds. + Eliquis. No high ventricular rates noted. Patient educated about wound care, arm mobility, lifting restrictions. ROV in 3 months with GT.

## 2014-06-20 ENCOUNTER — Encounter (INDEPENDENT_AMBULATORY_CARE_PROVIDER_SITE_OTHER): Payer: TRICARE For Life (TFL) | Admitting: General Surgery

## 2014-06-23 ENCOUNTER — Encounter: Payer: Self-pay | Admitting: Internal Medicine

## 2014-06-27 ENCOUNTER — Ambulatory Visit (INDEPENDENT_AMBULATORY_CARE_PROVIDER_SITE_OTHER): Payer: Medicare Other | Admitting: General Surgery

## 2014-06-27 ENCOUNTER — Encounter (INDEPENDENT_AMBULATORY_CARE_PROVIDER_SITE_OTHER): Payer: Self-pay | Admitting: General Surgery

## 2014-06-27 VITALS — BP 150/76 | HR 79 | Temp 97.3°F | Ht 70.0 in | Wt 177.4 lb

## 2014-06-27 DIAGNOSIS — K6289 Other specified diseases of anus and rectum: Secondary | ICD-10-CM

## 2014-06-27 MED ORDER — LIDOCAINE 5 % EX OINT: 1.0000 "application " | TOPICAL_OINTMENT | CUTANEOUS | Status: DC | PRN

## 2014-06-27 NOTE — Progress Notes (Signed)
Chief Complaint  Patient presents with  . eval anal fissure    HISTORY: Ross Morgan is a 80 y.o. male who presents to the office with anal pain.  Other symptoms include occasional bleeding, itching.  This had been occurring for many years.  he has tried steroid creams in the past with good success.  Multiple BM's makes the symptoms worse.   It is continuous in nature. It feels like passing shards of glass. his bowel habits are regular and his bowel movements are usually hard.  his fiber intake is dietary.  He gets severe diarrhea when he takes a fiber supplement.  his last colonoscopy was in 2012 and normal.       Past Medical History  Diagnosis Date  . Hypertension   . Hyperlipidemia   . Anal fissure   . Knee pain 2015    "tore tendon left knee; no OR"  . Bradycardia   . Atrial fibrillation   . Type II diabetes mellitus   . Arthritis     "left pointer" (05/30/2014)  . Depression   . Symptomatic bradycardia     status post dual AV permanent transvenous pacemaker insertion  . On continuous oral anticoagulation       Past Surgical History  Procedure Laterality Date  . Cataract extraction w/ intraocular lens  implant, bilateral Bilateral 2000's  . Pacemaker insertion  05/31/14    STJ dual chamber pacemaker implanted by Dr Lovena Le for symptomatic bradycardia        Current Outpatient Prescriptions  Medication Sig Dispense Refill  . amLODipine (NORVASC) 10 MG tablet Take 10 mg by mouth daily.      [name] apixaban (ELIQUIS) 5 MG TABS tablet Take 1 tablet (5 mg total) by mouth 2 (two) times daily.  60 tablet  11  . citalopram (CELEXA) 10 MG tablet Take 10 mg by mouth daily.      . Coenzyme Q10 (CO Q-10) 200 MG CAPS Take 1 capsule by mouth daily.       . fluorouracil (EFUDEX) 5 % cream Apply 1 application topically daily as needed (for irritation).      [name] glucose blood test strip 1 each by Other route as needed for other. Use as instructed      . FIBER PO Take 1 each by mouth 2 (two) times  daily.      . hydrocortisone (ANUSOL-HC) 2.5 % rectal cream Place 1 application rectally 2 (two) times daily.      [name] lidocaine (XYLOCAINE) 5 % ointment Apply 1 application topically as needed.  35.44 g  2  . Lidocaine-Hydrocortisone Ace (ANAMANTLE HC) 3-0.5 % KIT Place 1 application rectally daily as needed (for irritation).       . Multiple Vitamin (MULTIVITAMIN) capsule Take 1 capsule by mouth daily.      . NON FORMULARY Place 1 drop into the left eye at bedtime. CVS lubricant eye drops qhs      . NON FORMULARY Place 1 application into the right eye at bedtime. Mura 5% eye ointment in right eye qhs      . Omega 3 1200 MG CAPS Take 1,200 mg by mouth daily.      . pimecrolimus (ELIDEL) 1 % cream Apply topically 2 (two) times daily.      . Red Yeast Rice 600 MG CAPS Take 1,200 mg by mouth 2 (two) times daily.       . Simethicone (GAS-X PO) Take 1 tablet by mouth daily as needed (for  gas relief).       . sitaGLIPtin-metformin (JANUMET) 50-1000 MG per tablet Take 1 tablet by mouth 2 (two) times daily with a meal.      . tamsulosin (FLOMAX) 0.4 MG CAPS capsule Take 0.4 mg by mouth.      . valsartan (DIOVAN) 160 MG tablet Take 160 mg by mouth daily.      . vitamin C (ASCORBIC ACID) 500 MG tablet Take 500 mg by mouth daily.       No current facility-administered medications for this visit.      No Known Allergies    Family History  Problem Relation Age of Onset  . Heart attack Neg Hx     History   Social History  . Marital Status: Married    Spouse Name: N/A    Number of Children: 2  . Years of Education: N/A   Occupational History  . retired Optometrist    Social History Main Topics  . Smoking status: Former Smoker -- 1 years    Types: Cigarettes  . Smokeless tobacco: Never Used     Comment: "quit smoking in ~ 1952  . Alcohol Use: Yes     Comment: 05/30/2014 "glass of wine ~ once/month"  . Drug Use: No  . Sexual Activity: Yes   Other Topics Concern  . Not on file   Social  History Narrative  . No narrative on file      REVIEW OF SYSTEMS - PERTINENT POSITIVES ONLY: Review of Systems - General ROS: negative for - chills, fever or weight loss Hematological and Lymphatic ROS: negative for - bleeding problems, blood clots or bruising Respiratory ROS: no cough, shortness of breath, or wheezing Cardiovascular ROS: no chest pain or dyspnea on exertion Gastrointestinal ROS: no abdominal pain, change in bowel habits, or black or bloody stools Genito-Urinary ROS: no dysuria, trouble voiding, or hematuria  EXAM: Filed Vitals:   06/27/14 1459  BP: 150/76  Pulse: 79  Temp: 97.3 F (36.3 C)    General appearance: alert and cooperative Resp: clear to auscultation bilaterally Cardio: regular rate and rhythm GI: soft, non-tender; bowel sounds normal; no masses,  no organomegaly  Procedure: Anoscopy Surgeon: Marcello Moores Diagnosis: anal pian  Assistant: Patria Mane After the risks and benefits were explained, verbal consent was obtained for above procedure  Anesthesia: none Findings: Long chronic-appearing fissure in the posterior midline with no associated sphincter hypertension.  No masses. Minimal hemorrhoid disease. Perianal chronic inflammation noted.    ASSESSMENT AND PLAN: Ross Morgan is a 80 y.o. male With chronic anal pain. On exam he appears to have a superior anal fissure without sphincter hypertension. I've recommended that he use MiraLax on a daily basis to titrate his bowel movements to loose stool. I've recommended that he stop the lidocaine hydrocortisone ointment that he is using on a daily basis. I will give him a prescription for lidocaine only ointment. I will see him back in the office in 2 months to evaluate his progress. If he continues to have pain with this new regimen, he may need an exam under anesthesia with biopsy. I explained to him that this is a very chronic problem and therefore a solution to the problem we'll not be immediate. He  seems to understand this well.    Rosario Adie, MD Colon and Rectal Surgery / [facility]      Visit Diagnoses: 1. Anal pain     Primary Care Physician: Marton Redwood, MD

## 2014-06-27 NOTE — Patient Instructions (Addendum)
Stop taking lidocaine and hydrocortisone cream and switch to lidocaine 5% cream. Using MiraLax one half Per day up to 2 cap fulls per day to titrate to soft bowel movements.  Return to the office in 2 months.

## 2014-06-30 ENCOUNTER — Encounter: Payer: Self-pay | Admitting: Internal Medicine

## 2014-08-08 ENCOUNTER — Telehealth: Payer: Self-pay | Admitting: *Deleted

## 2014-08-08 NOTE — Telephone Encounter (Signed)
Spoke to pt about recent AF---no new sxms per pt. Encouraged him to call back if anything changes before his appt with GT in 09-2014.

## 2014-08-18 ENCOUNTER — Encounter: Payer: Self-pay | Admitting: Internal Medicine

## 2014-08-29 ENCOUNTER — Encounter (INDEPENDENT_AMBULATORY_CARE_PROVIDER_SITE_OTHER): Payer: Self-pay | Admitting: General Surgery

## 2014-09-08 ENCOUNTER — Telehealth: Payer: Self-pay | Admitting: Cardiovascular Disease

## 2014-09-09 NOTE — Telephone Encounter (Signed)
Closed encounter °

## 2014-09-27 ENCOUNTER — Encounter: Payer: Medicare Other | Admitting: Internal Medicine

## 2014-09-29 ENCOUNTER — Ambulatory Visit (INDEPENDENT_AMBULATORY_CARE_PROVIDER_SITE_OTHER): Payer: Medicare Other | Admitting: Internal Medicine

## 2014-09-29 ENCOUNTER — Encounter: Payer: Self-pay | Admitting: Internal Medicine

## 2014-09-29 VITALS — BP 124/50 | HR 60 | Ht 70.0 in | Wt 182.0 lb

## 2014-09-29 DIAGNOSIS — Z950 Presence of cardiac pacemaker: Secondary | ICD-10-CM | POA: Insufficient documentation

## 2014-09-29 DIAGNOSIS — I10 Essential (primary) hypertension: Secondary | ICD-10-CM

## 2014-09-29 DIAGNOSIS — I482 Chronic atrial fibrillation, unspecified: Secondary | ICD-10-CM

## 2014-09-29 DIAGNOSIS — R001 Bradycardia, unspecified: Secondary | ICD-10-CM

## 2014-09-29 LAB — MDC_IDC_ENUM_SESS_TYPE_INCLINIC
Battery Remaining Longevity: 114 mo
Battery Voltage: 3.02 V
Brady Statistic RV Percent Paced: 40 %
Implantable Pulse Generator Model: 2240
Implantable Pulse Generator Serial Number: 7643490
Lead Channel Impedance Value: 462.5 Ohm
Lead Channel Pacing Threshold Amplitude: 0.75 V
Lead Channel Pacing Threshold Amplitude: 1 V
Lead Channel Pacing Threshold Pulse Width: 0.4 ms
Lead Channel Setting Pacing Amplitude: 2 V
Lead Channel Setting Pacing Amplitude: 2.5 V
Lead Channel Setting Pacing Pulse Width: 0.4 ms
MDC IDC MSMT LEADCHNL RA IMPEDANCE VALUE: 475 Ohm
MDC IDC MSMT LEADCHNL RA PACING THRESHOLD PULSEWIDTH: 0.4 ms
MDC IDC MSMT LEADCHNL RA SENSING INTR AMPL: 3.4 mV
MDC IDC MSMT LEADCHNL RV SENSING INTR AMPL: 7 mV
MDC IDC SESS DTM: 20151119151801
MDC IDC SET LEADCHNL RV SENSING SENSITIVITY: 2 mV
MDC IDC STAT BRADY RA PERCENT PACED: 56 %

## 2014-09-29 NOTE — Progress Notes (Signed)
HPI Ross Morgan returns today for follow-up. He is a very pleasant 80 year old man with a history of symptomatic sinus node dysfunction and complete heart block, who underwent permanent pacemaker insertion several months ago. He has been bothered by dizziness in the past, and after his pacemaker insertion, his dizziness improved. However he continued to have it at times, particularly when he tries to move too fast. He denies chest pain or shortness of breath. No peripheral edema. No Known Allergies   Current Outpatient Prescriptions  Medication Sig Dispense Refill  . amLODipine (NORVASC) 10 MG tablet Take 10 mg by mouth daily.    [name] apixaban (ELIQUIS) 5 MG TABS tablet Take 1 tablet (5 mg total) by mouth 2 (two) times daily. 60 tablet 11  . citalopram (CELEXA) 10 MG tablet Take 10 mg by mouth daily.    . Coenzyme Q10 (CO Q-10) 200 MG CAPS Take 1 capsule by mouth daily.     . fluorouracil (EFUDEX) 5 % cream Apply 1 application topically daily as needed (for irritation).    [name] glucose blood test strip 1 each by Other route as needed for other. Use as instructed    . lidocaine (XYLOCAINE) 5 % ointment Apply 1 application topically as needed. (Patient taking differently: Apply 1 application topically as needed (pain). ) 35.44 g 2  . Lidocaine-Hydrocortisone Ace (ANAMANTLE HC) 3-0.5 % KIT Place 1 application rectally daily as needed (for irritation).     . Multiple Vitamin (MULTIVITAMIN) capsule Take 1 capsule by mouth daily.    . NON FORMULARY Place 1 drop into the left eye at bedtime. CVS lubricant eye drops qhs    . NON FORMULARY Place 1 application into the right eye at bedtime. Mura 5% eye ointment in right eye qhs    . pimecrolimus (ELIDEL) 1 % cream Apply 1 application topically daily as needed.     . polyethylene glycol (MIRALAX / GLYCOLAX) packet Take 17 g by mouth every morning.    . Red Yeast Rice 600 MG CAPS Take 1,200 mg by mouth 2 (two) times daily.     . Simethicone (GAS-X PO)  Take 1 tablet by mouth daily as needed (for gas relief).     . sitaGLIPtin-metformin (JANUMET) 50-1000 MG per tablet Take 1 tablet by mouth 2 (two) times daily with a meal.    . Sodium Bicarbonate-Citric Acid (ALKA-SELTZER HEARTBURN PO) Take 1 tablet by mouth at bedtime.    . tamsulosin (FLOMAX) 0.4 MG CAPS capsule Take 0.4 mg by mouth at bedtime.     . valsartan (DIOVAN) 160 MG tablet Take 160 mg by mouth daily.    . vitamin C (ASCORBIC ACID) 500 MG tablet Take 500 mg by mouth daily.     No current facility-administered medications for this visit.     Past Medical History  Diagnosis Date  . Hypertension   . Hyperlipidemia   . Anal fissure   . Knee pain 2015    "tore tendon left knee; no OR"  . Bradycardia   . Atrial fibrillation   . Type II diabetes mellitus   . Arthritis     "left pointer" (05/30/2014)  . Depression   . Symptomatic bradycardia     status post dual AV permanent transvenous pacemaker insertion  . On continuous oral anticoagulation     ROS:   All systems reviewed and negative except as noted in the HPI.   Past Surgical History  Procedure Laterality Date  . Cataract  extraction w/ intraocular lens  implant, bilateral Bilateral 2000's  . Pacemaker insertion  05/31/14    STJ dual chamber pacemaker implanted by Dr Lovena Le for symptomatic bradycardia     Family History  Problem Relation Age of Onset  . Heart attack Neg Hx      History   Social History  . Marital Status: Married    Spouse Name: Ross Morgan    Number of Children: 2  . Years of Education: Ross Morgan   Occupational History  . retired Optometrist    Social History Main Topics  . Smoking status: Former Smoker -- 1 years    Types: Cigarettes  . Smokeless tobacco: Never Used     Comment: "quit smoking in ~ 1952  . Alcohol Use: Yes     Comment: 05/30/2014 "glass of wine ~ once/month"  . Drug Use: No  . Sexual Activity: Yes   Other Topics Concern  . Not on file   Social History Narrative      BP 124/50 mmHg  Pulse 60  Ht _0  (1.778 m)  Wt 182 lb (82.555 kg)  BMI 26.11 kg/m2  Physical Exam:  Well appearing 80 year old man, NAD HEENT: Unremarkable Neck:  No JVD, no thyromegally Back:  No CVA tenderness Lungs:  Clear with a well-healed pacemaker incision. HEART:  Regular rate rhythm, no murmurs, no rubs, no clicks Abd:  soft, positive bowel sounds, no organomegally, no rebound, no guarding Ext:  2 plus pulses, no edema, no cyanosis, no clubbing Skin:  No rashes no nodules Neuro:  CN II through XII intact, motor grossly intact   DEVICE  Normal device function.  See PaceArt for details.   Assess/Plan:

## 2014-09-29 NOTE — Patient Instructions (Signed)
Your physician wants you to follow-up in: 9 months with Dr. Taylor You will receive a reminder letter in the mail two months in advance. If you don't receive a letter, please call our office to schedule the follow-up appointment.   Remote monitoring is used to monitor your Pacemaker of ICD from home. This monitoring reduces the number of office visits required to check your device to one time per year. It allows us to keep an eye on the functioning of your device to ensure it is working properly. You are scheduled for a device check from home on 01/02/15. You may send your transmission at any time that day. If you have a wireless device, the transmission will be sent automatically. After your physician reviews your transmission, you will receive a postcard with your next transmission date.   

## 2014-09-29 NOTE — Assessment & Plan Note (Signed)
His St. Jude dual-chamber pacemaker is working normally. We'll plan to recheck in several months. 

## 2014-09-29 NOTE — Assessment & Plan Note (Signed)
Pacemaker interrogation today demonstrates that he is out of rhythm approximately 14% of the time. He will continue his current medical regimen including Eliquis.

## 2014-09-29 NOTE — Assessment & Plan Note (Signed)
His blood pressure is well controlled today. He does have some symptoms of orthostasis.

## 2014-10-11 ENCOUNTER — Telehealth: Payer: Self-pay | Admitting: Internal Medicine

## 2014-10-11 NOTE — Telephone Encounter (Signed)
Spoke with patient, TENS unit is not recommended with a PPM.   And occasional use of Advil with Eliquis is recommended.

## 2014-10-11 NOTE — Telephone Encounter (Signed)
New Message  Per Pt, he had a pacer put in on 05/31/2014. Pt reports he is in severe back pain and requests a tens unit for the pain. Please call back to discuss.

## 2014-10-20 ENCOUNTER — Encounter (HOSPITAL_COMMUNITY): Payer: Self-pay | Admitting: Internal Medicine

## 2014-11-17 ENCOUNTER — Ambulatory Visit (INDEPENDENT_AMBULATORY_CARE_PROVIDER_SITE_OTHER): Payer: Medicare Other | Admitting: Cardiovascular Disease

## 2014-11-17 ENCOUNTER — Encounter: Payer: Self-pay | Admitting: Cardiovascular Disease

## 2014-11-17 VITALS — BP 140/64 | HR 65 | Ht 70.0 in | Wt 180.5 lb

## 2014-11-17 DIAGNOSIS — E785 Hyperlipidemia, unspecified: Secondary | ICD-10-CM | POA: Diagnosis not present

## 2014-11-17 DIAGNOSIS — I480 Paroxysmal atrial fibrillation: Secondary | ICD-10-CM | POA: Diagnosis not present

## 2014-11-17 DIAGNOSIS — I10 Essential (primary) hypertension: Secondary | ICD-10-CM

## 2014-11-17 DIAGNOSIS — Z950 Presence of cardiac pacemaker: Secondary | ICD-10-CM | POA: Diagnosis not present

## 2014-11-17 NOTE — Progress Notes (Signed)
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 He has a history of hypertension medically treated as well as hyperlipidemia. There is no other cardiovascular risk factor history. He has had bradycardia in the past. He says his blood pressure at home. He's had recent heart rates in the 30s and 40s with excessive fatigue and dizziness which is orthostatic. He denies chest pain but does have dyspnea on exertion. He had an event monitor that showed significant bradycardia and ultimately was admitted and underwent permanent pacemaker insertion by [name] on 05/31/14. He had a St. Jude DDD permanent pacemaker (serial [id number]) inserted. He had a 2-D echocardiogram performed on 05/06/14 which was essentially normal. His CHA2DSVASC score was 4 and he was properly anticoagulated with a novel oral anticoagulant. He feels symptomatically improved since pacemaker insertion. Since I saw him in the office he has seen [name] back in follow-up demonstrating normal pacer function. He continues to be asymptomatic.   Current Outpatient Prescriptions  Medication Sig Dispense Refill  . amLODipine (NORVASC) 10 MG tablet Take 10 mg by mouth daily.    [name] apixaban (ELIQUIS) 5 MG TABS tablet Take 1 tablet (5 mg total) by mouth 2 (two) times daily. 60 tablet 11  . citalopram (CELEXA) 10 MG tablet Take 10 mg by mouth daily.    . Coenzyme Q10 (CO Q-10) 200 MG CAPS Take 1 capsule by mouth daily.     . fluorouracil (EFUDEX) 5 % cream Apply 1 application topically daily as needed (for irritation).    [name] glucose blood test strip 1 each by Other route as needed for other. Use as instructed     . lidocaine (XYLOCAINE) 5 % ointment Apply 1 application topically as needed. (Patient taking differently: Apply 1 application topically as needed (pain). ) 35.44 g 2  . Lidocaine-Hydrocortisone Ace (ANAMANTLE HC) 3-0.5 % KIT Place 1 application rectally daily as needed (for irritation).     . Multiple Vitamin (MULTIVITAMIN) capsule Take 1 capsule by mouth daily.    . NON FORMULARY Place 1 drop into the left eye at bedtime. [facility] lubricant eye drops qhs    . NON FORMULARY Place 1 application into the right eye at bedtime. Mura 5% eye ointment in right eye qhs    . pimecrolimus (ELIDEL) 1 % cream Apply 1 application topically daily as needed.     . polyethylene glycol (MIRALAX / GLYCOLAX) packet Take 17 g by mouth every morning.    . Red Yeast Rice 600 MG CAPS Take 1,200 mg by mouth 2 (two) times daily.     . Simethicone (GAS-X PO) Take 1 tablet by mouth daily as needed (for gas relief).     . sitaGLIPtin-metformin (JANUMET) 50-1000 MG per tablet Take 1 tablet by mouth 2 (two) times daily with a meal.    . Sodium Bicarbonate-Citric Acid (ALKA-SELTZER HEARTBURN PO) Take 1 tablet by mouth at bedtime.    . tamsulosin (FLOMAX) 0.4 MG CAPS capsule Take 0.4 mg by mouth at bedtime.     . valsartan (DIOVAN) 160 MG tablet Take 160 mg by mouth daily.    . vitamin C (ASCORBIC ACID) 500 MG tablet Take 500 mg by  mouth daily.     No current facility-administered medications for this visit.    No Known Allergies  History   Social History  . Marital Status: Married    Spouse Name: N/A    Number of Children: 2  . Years of Education: N/A   Occupational History  . retired Optometrist    Social History Main Topics  . Smoking status: Former Smoker -- 1 years    Types: Cigarettes  . Smokeless tobacco: Never Used     Comment: "quit smoking in ~ 1952  . Alcohol Use: Yes     Comment: 05/30/2014 "glass of wine ~ once/month"  . Drug Use: No  . Sexual Activity: Yes   Other Topics Concern  . Not on  file   Social History Narrative     Review of Systems: General: negative for chills, fever, night sweats or weight changes.  Cardiovascular: negative for chest pain, dyspnea on exertion, edema, orthopnea, palpitations, paroxysmal nocturnal dyspnea or shortness of breath Dermatological: negative for rash Respiratory: negative for cough or wheezing Urologic: negative for hematuria Abdominal: negative for nausea, vomiting, diarrhea, bright red blood per rectum, melena, or hematemesis Neurologic: negative for visual changes, syncope, or dizziness All other systems reviewed and are otherwise negative except as noted above.    Blood pressure 140/64, pulse 65, height $RemoveBe'5\' 10"'gySqAbPSE$  (1.778 m), weight 180 lb 8 oz (81.874 kg).  General appearance: alert and no distress Neck: no adenopathy, no carotid bruit, no JVD, supple, symmetrical, trachea midline and thyroid not enlarged, symmetric, no tenderness/mass/nodules Lungs: clear to auscultation bilaterally Heart: regular rate and rhythm, S1, S2 normal, no murmur, click, rub or gallop Extremities: extremities normal, atraumatic, no cyanosis or edema  EKG normal sinus rhythm at 65 without ST or T-wave changes. I personally reviewed this EKG  ASSESSMENT AND PLAN:   Pacemaker Patient had a St. Jude DDD permanent transvenous pacemaker inserted by Dr. Crissie Sickles for symptomatically bradycardia 05/31/14. He was recently seen in November by [name] with normal pacer function. This is checked quarterly by remote monitoring at home.  Hyperlipidemia History of hyperlipidemia on red yeast rice followed by his primary care physician  Essential hypertension History of hypertension with blood pressure management measured today at 140/64. He is on valsartan 160 mg as well as amlodipine. Continue current meds at current dosing  Atrial fibrillation, slow VR and pauses History of paroxysmal A. Fib fibrillation with intermittent slow ventricular response the past  post pacemaker insertion. He is in sinus rhythm today. He is on Eliquis Oral anticoagulation.      Ross Harp MD FACP,FACC,FAHA, [facility] 11/17/2014 4:39 PM

## 2014-11-17 NOTE — Assessment & Plan Note (Signed)
Patient had a St. Jude DDD permanent transvenous pacemaker inserted by Dr. Sharrell KuGreg Taylor for symptomatically bradycardia 05/31/14. He was recently seen in November by Dr. Ladona Ridgelaylor with normal pacer function. This is checked quarterly by remote monitoring at home.

## 2014-11-17 NOTE — Assessment & Plan Note (Signed)
History of hypertension with blood pressure management measured today at 140/64. He is on valsartan 160 mg as well as amlodipine. Continue current meds at current dosing

## 2014-11-17 NOTE — Assessment & Plan Note (Signed)
History of hyperlipidemia on red yeast rice followed by his primary care physician

## 2014-11-17 NOTE — Assessment & Plan Note (Signed)
History of paroxysmal A. Fib fibrillation with intermittent slow ventricular response the past post pacemaker insertion. He is in sinus rhythm today. He is on Eliquis Oral anticoagulation.

## 2014-11-17 NOTE — Patient Instructions (Signed)
Your physician wants you to follow-up in: 1 year with Dr Berry. You will receive a reminder letter in the mail two months in advance. If you don't receive a letter, please call our office to schedule the follow-up appointment.  

## 2014-12-09 ENCOUNTER — Ambulatory Visit: Payer: Medicare Other | Admitting: Cardiovascular Disease

## 2015-01-02 ENCOUNTER — Ambulatory Visit (INDEPENDENT_AMBULATORY_CARE_PROVIDER_SITE_OTHER): Payer: Medicare Other | Admitting: *Deleted

## 2015-01-02 DIAGNOSIS — R001 Bradycardia, unspecified: Secondary | ICD-10-CM | POA: Diagnosis not present

## 2015-01-02 LAB — MDC_IDC_ENUM_SESS_TYPE_REMOTE
Battery Remaining Longevity: 110 mo
Battery Voltage: 3.04 V
Brady Statistic AP VS Percent: 51 %
Brady Statistic AS VP Percent: 1 %
Brady Statistic AS VS Percent: 18 %
Brady Statistic RV Percent Paced: 32 %
Date Time Interrogation Session: 20160222070028
Implantable Pulse Generator Model: 2240
Implantable Pulse Generator Serial Number: 7643490
Lead Channel Impedance Value: 460 Ohm
Lead Channel Impedance Value: 510 Ohm
Lead Channel Pacing Threshold Amplitude: 0.75 V
Lead Channel Pacing Threshold Pulse Width: 0.4 ms
Lead Channel Sensing Intrinsic Amplitude: 4.7 mV
Lead Channel Setting Pacing Pulse Width: 0.4 ms
Lead Channel Setting Sensing Sensitivity: 2 mV
MDC IDC MSMT BATTERY REMAINING PERCENTAGE: 95.5 %
MDC IDC MSMT LEADCHNL RA PACING THRESHOLD PULSEWIDTH: 0.4 ms
MDC IDC MSMT LEADCHNL RV PACING THRESHOLD AMPLITUDE: 1 V
MDC IDC MSMT LEADCHNL RV SENSING INTR AMPL: 5.4 mV
MDC IDC SET LEADCHNL RA PACING AMPLITUDE: 2 V
MDC IDC SET LEADCHNL RV PACING AMPLITUDE: 2.5 V
MDC IDC STAT BRADY AP VP PERCENT: 31 %
MDC IDC STAT BRADY RA PERCENT PACED: 78 %

## 2015-01-02 NOTE — Progress Notes (Signed)
Remote pacemaker transmission.   

## 2015-01-13 ENCOUNTER — Encounter: Payer: Self-pay | Admitting: Cardiology

## 2015-01-18 ENCOUNTER — Encounter: Payer: Self-pay | Admitting: Internal Medicine

## 2015-02-27 ENCOUNTER — Encounter: Payer: Self-pay | Admitting: Cardiovascular Disease

## 2015-02-28 ENCOUNTER — Encounter: Payer: Self-pay | Admitting: Cardiovascular Disease

## 2015-03-07 ENCOUNTER — Telehealth: Payer: Self-pay | Admitting: *Deleted

## 2015-03-07 NOTE — Telephone Encounter (Signed)
I spoke with patient and he is not comfortable with statins (due to the literature that he has read). He has not tried any statins. 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 Mr Ross Morgan admitted that he would be willing to try a statin, but wants Dr Clelia CroftShaw to prescribe. I will forward this message to Dr Clelia CroftShaw.

## 2015-04-05 ENCOUNTER — Ambulatory Visit (INDEPENDENT_AMBULATORY_CARE_PROVIDER_SITE_OTHER): Payer: Medicare Other | Admitting: *Deleted

## 2015-04-05 ENCOUNTER — Encounter: Payer: Self-pay | Admitting: Internal Medicine

## 2015-04-05 DIAGNOSIS — R001 Bradycardia, unspecified: Secondary | ICD-10-CM

## 2015-04-05 LAB — CUP PACEART REMOTE DEVICE CHECK
Battery Remaining Percentage: 95.5 %
Battery Voltage: 3.02 V
Brady Statistic AS VS Percent: 15 %
Lead Channel Impedance Value: 460 Ohm
Lead Channel Impedance Value: 460 Ohm
Lead Channel Pacing Threshold Amplitude: 1 V
Lead Channel Pacing Threshold Pulse Width: 0.4 ms
Lead Channel Sensing Intrinsic Amplitude: 3.2 mV
Lead Channel Setting Pacing Amplitude: 2 V
Lead Channel Setting Pacing Amplitude: 2.5 V
MDC IDC MSMT BATTERY REMAINING LONGEVITY: 106 mo
MDC IDC MSMT LEADCHNL RA PACING THRESHOLD AMPLITUDE: 0.75 V
MDC IDC MSMT LEADCHNL RV PACING THRESHOLD PULSEWIDTH: 0.4 ms
MDC IDC MSMT LEADCHNL RV SENSING INTR AMPL: 4 mV
MDC IDC PG SERIAL: 7643490
MDC IDC SESS DTM: 20160525060014
MDC IDC SET LEADCHNL RV PACING PULSEWIDTH: 0.4 ms
MDC IDC SET LEADCHNL RV SENSING SENSITIVITY: 2 mV
MDC IDC STAT BRADY AP VP PERCENT: 30 %
MDC IDC STAT BRADY AP VS PERCENT: 54 %
MDC IDC STAT BRADY AS VP PERCENT: 1 %
MDC IDC STAT BRADY RA PERCENT PACED: 78 %
MDC IDC STAT BRADY RV PERCENT PACED: 33 %
Pulse Gen Model: 2240

## 2015-04-05 NOTE — Progress Notes (Signed)
Remote pacemaker transmission.   

## 2015-04-12 ENCOUNTER — Encounter: Payer: Self-pay | Admitting: Cardiology

## 2015-05-18 ENCOUNTER — Other Ambulatory Visit: Payer: Self-pay | Admitting: Physician Assistant

## 2015-07-11 ENCOUNTER — Other Ambulatory Visit: Payer: Self-pay | Admitting: General Surgery

## 2015-07-11 NOTE — H&P (Signed)
History of Present Illness   Patient words: rectal pain.  The patient is a 81 year old male who presents with anal pain. 80 year old male who presents to the office for chronic rectal burning and pain after bowel movements. He has switched to MiraLAX which has helped some. He continues to have irritation in the area. He occasionally has some mild bleeding. He completed a week course of diltiazem in the past which helped some but did not alleviate all of his symptoms.       Allergies   No Known Drug Allergies 08/29/2014    Past Medical History   ANAL PAIN (569.42  K62.89) ; This is a 81 year old male who presented to the office previously this year with an anal fissure. We have tried conservative measures. He has now completed an 8 week course of diltiazem ointment.  CHRONIC ANAL FISSURE (565.0  K60.1) ; This is a 81 year old male who is being treated for an anal fissure. He has had symptoms for many years. Uses steroid cream for his symptoms. He came to my office 2 months ago with pain with bowel movements and some constipation. His symptoms got somewhat better with MiraLax. They have not completely resolved.    Family History   Colon Cancer: Father  Rectal Cancer: Father  Colon Polyps: Mother    Social History   Alcohol use: Occasional alcohol use  Caffeine use: Carbonated beverages, Coffee, Tea  Tobacco use: Former smoker    Medication History   Janumet (50-1000MG  Tablet, Oral) Active.  AmLODIPine Besylate (  Tablet, Oral) Active.  Tamsulosin HCl (0.4MG  Capsule, Oral) Active.  Valsartan (  Tablet, Oral) Active.  Citalopram Hydrobromide (  Tablet, Oral) Active.  Eliquis (  Tablet, Oral) Active.  Lidocaine (5% Ointment, External) Active.  Lidocaine-Hydrocortisone Ace (3-0.5% Cream, Rectal) Active.  Co Q 10 (  Capsule, Oral) Active.  Multivitamin Adults 50+ ( Oral) Active.  Omega 3 (  Capsule, Oral) Active.  Red Yeast Rice Extract (  Capsule, Oral)  Active.  Elidel (1% Cream, External) Active.  Fluorouracil (5% Cream, External) Active.  Atorvastatin Calcium (  Tablet, Oral) Active.  Flomax (0.4MG  Capsule, Oral) Active.  Medications Reconciled.    Other Problems   High blood pressure  Hypercholesterolemia  Enlarged Prostate  Hemorrhoids  Diabetes Mellitus  Atrial Fibrillation    Past Surgical   Colon Polyp Removal - Colonoscopy  Cataract Surgery: Bilateral  Cardiac Pacemaker Insertion (05/26/2014)    Diagnostic Studies   Colonoscopy; 5-10 years ago     Vitals   07/11/2015 11:00 AM   Weight: 177 lb Height: 70 in   Body Surface Area: 1.99 m Body Mass Index: 25.4 kg/m     Temp.: 98 F (Temporal) Pulse: 81 (Regular)   BP: 128/80 Manual (Sitting, Left Arm, Standard)     Assessment & Plan  CHRONIC ANAL FISSURE (565.0  K60.1)  Today's Impression: This is a 81 year old male who is being treated for an anal fissure. He has had symptoms for many years. Uses steroid cream for his symptoms. He came to my office last year with pain with bowel movements and some constipation. His symptoms got somewhat better with MiraLax, but they have not completely resolved. On exam he has a stellate pattern fissuring noted. There is significant specter hypertension. I believe he may benefit from chemical sphincterotomy. We discussed the typical side effects which can include some temporary fecal incontinence.   Current Plans:  Started Lidocaine 5%, 1 application three times daily, as needed, 1 Tube, 07/11/2015, Ref. x3.

## 2015-07-12 ENCOUNTER — Ambulatory Visit (INDEPENDENT_AMBULATORY_CARE_PROVIDER_SITE_OTHER): Payer: Medicare Other | Admitting: Internal Medicine

## 2015-07-12 ENCOUNTER — Encounter: Payer: Self-pay | Admitting: Internal Medicine

## 2015-07-12 VITALS — BP 112/66 | HR 82 | Ht 70.0 in | Wt 179.8 lb

## 2015-07-12 DIAGNOSIS — E785 Hyperlipidemia, unspecified: Secondary | ICD-10-CM | POA: Diagnosis not present

## 2015-07-12 DIAGNOSIS — Z950 Presence of cardiac pacemaker: Secondary | ICD-10-CM | POA: Diagnosis not present

## 2015-07-12 DIAGNOSIS — I480 Paroxysmal atrial fibrillation: Secondary | ICD-10-CM | POA: Diagnosis not present

## 2015-07-12 LAB — CUP PACEART INCLINIC DEVICE CHECK
Battery Voltage: 3.02 V
Brady Statistic RA Percent Paced: 68 %
Brady Statistic RV Percent Paced: 41 %
Date Time Interrogation Session: 20160831152547
Lead Channel Impedance Value: 462.5 Ohm
Lead Channel Impedance Value: 475 Ohm
Lead Channel Pacing Threshold Amplitude: 0.75 V
Lead Channel Pacing Threshold Amplitude: 0.75 V
Lead Channel Pacing Threshold Pulse Width: 0.4 ms
Lead Channel Sensing Intrinsic Amplitude: 3 mV
Lead Channel Sensing Intrinsic Amplitude: 4.2 mV
Lead Channel Setting Sensing Sensitivity: 1.5 mV
MDC IDC MSMT BATTERY REMAINING LONGEVITY: 110.4 mo
MDC IDC MSMT LEADCHNL RV PACING THRESHOLD PULSEWIDTH: 0.4 ms
MDC IDC SET LEADCHNL RA PACING AMPLITUDE: 2 V
MDC IDC SET LEADCHNL RV PACING AMPLITUDE: 2.5 V
MDC IDC SET LEADCHNL RV PACING PULSEWIDTH: 0.4 ms
Pulse Gen Model: 2240
Pulse Gen Serial Number: 7643490

## 2015-07-12 NOTE — Progress Notes (Signed)
HPI Mr. Mcglone returns today for follow-up. He is a very pleasant 81 year old man with a history of symptomatic sinus node dysfunction and complete heart block, who underwent permanent pacemaker insertion over a year ago. He had been bothered by dizziness. This has largely resolved.  He denies chest pain or shortness of breath. No peripheral edema. No Known Allergies   Current Outpatient Prescriptions  Medication Sig Dispense Refill  . amLODipine (NORVASC) 10 MG tablet Take 10 mg by mouth daily.    [name] atorvastatin (LIPITOR) 20 MG tablet Take 20 mg by mouth at bedtime.  6  . citalopram (CELEXA) 10 MG tablet Take 10 mg by mouth daily.    . Coenzyme Q10 (CO Q-10) 200 MG CAPS Take 1 capsule by mouth daily.     [name] ELIQUIS 5 MG TABS tablet TAKE 1 TABLET TWICE A DAY 60 tablet 3  . fluorouracil (EFUDEX) 5 % cream Apply 1 application topically daily as needed (for irritation).    [name] glucose blood test strip 1 each by Other route as needed for other. Use as instructed    . lidocaine (XYLOCAINE) 5 % ointment Apply 1 application topically as needed. (Patient taking differently: Apply 1 application topically as needed (pain). ) 35.44 g 2  . Lidocaine-Hydrocortisone Ace (ANAMANTLE HC) 3-0.5 % KIT Place 1 application rectally daily as needed (for irritation).     . Multiple Vitamin (MULTIVITAMIN) capsule Take 1 capsule by mouth daily.    . NON FORMULARY Place 1 drop into the left eye at bedtime. [facility] lubricant eye drops qhs    . NON FORMULARY Place 1 application into the right eye at bedtime. Mura 5% eye ointment in right eye qhs    . pimecrolimus (ELIDEL) 1 % cream Apply 1 application topically daily as needed.     . polyethylene glycol (MIRALAX / GLYCOLAX) packet Take 17 g by mouth every morning.    . Red Yeast Rice 600 MG CAPS Take 1,200 mg by mouth 2 (two) times daily.     . Simethicone (GAS-X PO) Take 1 tablet by mouth daily as needed (for gas relief).     . sitaGLIPtin-metformin (JANUMET) 50-1000  MG per tablet Take 1 tablet by mouth 2 (two) times daily with a meal.    . Sodium Bicarbonate-Citric Acid (ALKA-SELTZER HEARTBURN PO) Take 1 tablet by mouth at bedtime.    . tamsulosin (FLOMAX) 0.4 MG CAPS capsule Take 0.4 mg by mouth at bedtime.     . valsartan (DIOVAN) 160 MG tablet Take 160 mg by mouth daily.    . vitamin C (ASCORBIC ACID) 500 MG tablet Take 500 mg by mouth daily.     No current facility-administered medications for this visit.     Past Medical History  Diagnosis Date  . Hypertension   . Hyperlipidemia   . Anal fissure   . Knee pain 2015    "tore tendon left knee; no OR"  . Bradycardia   . Atrial fibrillation   . Type II diabetes mellitus   . Arthritis     "left pointer" (05/30/2014)  . Depression   . Symptomatic bradycardia     status post dual AV permanent transvenous pacemaker insertion  . On continuous oral anticoagulation     ROS:   All systems reviewed and negative except as noted in the HPI.   Past Surgical History  Procedure Laterality Date  . Cataract extraction w/ intraocular lens  implant, bilateral Bilateral 2000's  . Pacemaker  insertion  05/31/14    STJ dual chamber pacemaker implanted by Dr Lovena Le for symptomatic bradycardia  . Permanent pacemaker insertion N/A 05/31/2014    Procedure: PERMANENT PACEMAKER INSERTION;  Surgeon: Evans Lance, MD;  Location: [facility] CATH LAB;  Service: Cardiovascular;  Laterality: N/A;     Family History  Problem Relation Age of Onset  . Heart attack Neg Hx      Social History   Social History  . Marital Status: Married    Spouse Name: N/A  . Number of Children: 2  . Years of Education: N/A   Occupational History  . retired Optometrist    Social History Main Topics  . Smoking status: Former Smoker -- 1 years    Types: Cigarettes  . Smokeless tobacco: Never Used     Comment: "quit smoking in ~ 1952  . Alcohol Use: Yes     Comment: 05/30/2014 "glass of wine ~ once/month"  . Drug Use: No  .  Sexual Activity: Yes   Other Topics Concern  . Not on file   Social History Narrative     BP 112/66 mmHg  Pulse 82  Ht $R'5\' 10"'iy$  (1.778 m)  Wt 179 lb 12.8 oz (81.557 kg)  BMI 25.80 kg/m2  Physical Exam:  Well appearing 81 year old man, NAD HEENT: Unremarkable Neck:  No JVD, no thyromegally Back:  No CVA tenderness Lungs:  Clear with a well-healed pacemaker incision. HEART:  Regular rate rhythm, no murmurs, no rubs, no clicks Abd:  soft, positive bowel sounds, no organomegally, no rebound, no guarding Ext:  2 plus pulses, no edema, no cyanosis, no clubbing Skin:  No rashes no nodules Neuro:  CN II through XII intact, motor grossly intact   DEVICE  Normal device function.  See PaceArt for details.   Assess/Plan:

## 2015-07-12 NOTE — Patient Instructions (Addendum)
Medication Instructions: - no changes  Labwork: - none  Procedures/Testing: - none  Follow-Up: - Remote monitoring is used to monitor your Pacemaker of ICD from home. This monitoring reduces the number of office visits required to check your device to one time per year. It allows us to keep an eye on the functioning of your device to ensure it is working properly. You are scheduled for a device check from home on 10/11/15. You may send your transmission at any time that day. If you have a wireless device, the transmission will be sent automatically. After your physician reviews your transmission, you will receive a postcard with your next transmission date.  - Your physician wants you to follow-up in: 1 year with Dr. Taylor. You will receive a reminder letter in the mail two months in advance. If you don't receive a letter, please call our office to schedule the follow-up appointment.  Any Additional Special Instructions Will Be Listed Below (If Applicable). - none 

## 2015-07-12 NOTE — Assessment & Plan Note (Signed)
He is mostly maintaining NSR. He will continue his current meds.

## 2015-07-12 NOTE — Assessment & Plan Note (Signed)
His St. Jude DDD PM is working normally. Will recheck in several months. 

## 2015-07-12 NOTE — Assessment & Plan Note (Signed)
He is encouraged to maintain a low fat diet. No change in meds.  

## 2015-09-09 ENCOUNTER — Other Ambulatory Visit: Payer: Self-pay | Admitting: Internal Medicine

## 2015-09-11 ENCOUNTER — Other Ambulatory Visit: Payer: Self-pay | Admitting: *Deleted

## 2015-09-11 MED ORDER — APIXABAN 5 MG PO TABS: 5.0000 mg | ORAL_TABLET | Freq: Two times a day (BID) | ORAL | Status: DC

## 2015-10-11 ENCOUNTER — Ambulatory Visit (INDEPENDENT_AMBULATORY_CARE_PROVIDER_SITE_OTHER): Payer: Medicare Other | Admitting: *Deleted

## 2015-10-11 ENCOUNTER — Telehealth: Payer: Self-pay | Admitting: Cardiology

## 2015-10-11 DIAGNOSIS — R001 Bradycardia, unspecified: Secondary | ICD-10-CM

## 2015-10-11 LAB — CUP PACEART REMOTE DEVICE CHECK
Battery Remaining Percentage: 95.5 %
Battery Voltage: 3.02 V
Brady Statistic AS VS Percent: 0 %
Brady Statistic RA Percent Paced: 1 %
Date Time Interrogation Session: 20161130162540
Implantable Lead Implant Date: 20150721
Implantable Lead Location: 753859
Lead Channel Impedance Value: 460 Ohm
Lead Channel Impedance Value: 460 Ohm
Lead Channel Pacing Threshold Amplitude: 0.75 V
Lead Channel Pacing Threshold Pulse Width: 0.4 ms
Lead Channel Pacing Threshold Pulse Width: 0.4 ms
Lead Channel Sensing Intrinsic Amplitude: 4.8 mV
Lead Channel Setting Pacing Amplitude: 2 V
Lead Channel Setting Pacing Pulse Width: 0.4 ms
Lead Channel Setting Sensing Sensitivity: 1.5 mV
MDC IDC LEAD IMPLANT DT: 20150721
MDC IDC LEAD LOCATION: 753860
MDC IDC MSMT BATTERY REMAINING LONGEVITY: 115 mo
MDC IDC MSMT LEADCHNL RA PACING THRESHOLD AMPLITUDE: 0.75 V
MDC IDC MSMT LEADCHNL RA SENSING INTR AMPL: 3 mV
MDC IDC SET LEADCHNL RV PACING AMPLITUDE: 2.5 V
MDC IDC STAT BRADY AP VP PERCENT: 0 %
MDC IDC STAT BRADY AP VS PERCENT: 0 %
MDC IDC STAT BRADY AS VP PERCENT: 0 %
MDC IDC STAT BRADY RV PERCENT PACED: 82 %
Pulse Gen Model: 2240
Pulse Gen Serial Number: 7643490

## 2015-10-11 NOTE — Telephone Encounter (Signed)
Spoke with pt and reminded pt of remote transmission that is due today. Pt verbalized understanding.   

## 2015-10-12 NOTE — Progress Notes (Signed)
Remote pacemaker transmission.   

## 2015-10-24 ENCOUNTER — Encounter: Payer: Self-pay | Admitting: Cardiology

## 2015-11-17 ENCOUNTER — Ambulatory Visit: Payer: Medicare Other | Admitting: Cardiovascular Disease

## 2015-11-19 ENCOUNTER — Emergency Department (HOSPITAL_COMMUNITY): Payer: Medicare Other

## 2015-11-19 ENCOUNTER — Observation Stay (HOSPITAL_COMMUNITY): Payer: Medicare Other

## 2015-11-19 ENCOUNTER — Encounter (HOSPITAL_COMMUNITY): Payer: Self-pay | Admitting: Adult Health

## 2015-11-19 ENCOUNTER — Observation Stay (HOSPITAL_COMMUNITY)
Admission: EM | Admit: 2015-11-19 | Discharge: 2015-11-20 | Disposition: A | Discharge status: Home / Self Care | Payer: Medicare Other | Source: Home / Self Care | Attending: Family Medicine | Admitting: Family Medicine

## 2015-11-19 DIAGNOSIS — R001 Bradycardia, unspecified: Secondary | ICD-10-CM | POA: Diagnosis present

## 2015-11-19 DIAGNOSIS — H8149 Vertigo of central origin, unspecified ear: Secondary | ICD-10-CM

## 2015-11-19 DIAGNOSIS — Z950 Presence of cardiac pacemaker: Secondary | ICD-10-CM | POA: Diagnosis present

## 2015-11-19 DIAGNOSIS — N400 Enlarged prostate without lower urinary tract symptoms: Secondary | ICD-10-CM | POA: Insufficient documentation

## 2015-11-19 DIAGNOSIS — F329 Major depressive disorder, single episode, unspecified: Secondary | ICD-10-CM

## 2015-11-19 DIAGNOSIS — E118 Type 2 diabetes mellitus with unspecified complications: Secondary | ICD-10-CM | POA: Insufficient documentation

## 2015-11-19 DIAGNOSIS — I495 Sick sinus syndrome: Secondary | ICD-10-CM

## 2015-11-19 DIAGNOSIS — R2990 Unspecified symptoms and signs involving the nervous system: Secondary | ICD-10-CM

## 2015-11-19 DIAGNOSIS — M19042 Primary osteoarthritis, left hand: Secondary | ICD-10-CM | POA: Insufficient documentation

## 2015-11-19 DIAGNOSIS — E785 Hyperlipidemia, unspecified: Secondary | ICD-10-CM | POA: Insufficient documentation

## 2015-11-19 DIAGNOSIS — Z7984 Long term (current) use of oral hypoglycemic drugs: Secondary | ICD-10-CM

## 2015-11-19 DIAGNOSIS — R51 Headache: Secondary | ICD-10-CM | POA: Insufficient documentation

## 2015-11-19 DIAGNOSIS — Z961 Presence of intraocular lens: Secondary | ICD-10-CM

## 2015-11-19 DIAGNOSIS — I480 Paroxysmal atrial fibrillation: Secondary | ICD-10-CM

## 2015-11-19 DIAGNOSIS — G319 Degenerative disease of nervous system, unspecified: Secondary | ICD-10-CM | POA: Insufficient documentation

## 2015-11-19 DIAGNOSIS — G459 Transient cerebral ischemic attack, unspecified: Secondary | ICD-10-CM | POA: Insufficient documentation

## 2015-11-19 DIAGNOSIS — I639 Cerebral infarction, unspecified: Secondary | ICD-10-CM | POA: Diagnosis present

## 2015-11-19 DIAGNOSIS — I6523 Occlusion and stenosis of bilateral carotid arteries: Secondary | ICD-10-CM | POA: Insufficient documentation

## 2015-11-19 DIAGNOSIS — Z9842 Cataract extraction status, left eye: Secondary | ICD-10-CM | POA: Insufficient documentation

## 2015-11-19 DIAGNOSIS — Z7902 Long term (current) use of antithrombotics/antiplatelets: Secondary | ICD-10-CM | POA: Insufficient documentation

## 2015-11-19 DIAGNOSIS — E119 Type 2 diabetes mellitus without complications: Secondary | ICD-10-CM

## 2015-11-19 DIAGNOSIS — Z9841 Cataract extraction status, right eye: Secondary | ICD-10-CM | POA: Insufficient documentation

## 2015-11-19 DIAGNOSIS — I10 Essential (primary) hypertension: Secondary | ICD-10-CM | POA: Diagnosis present

## 2015-11-19 DIAGNOSIS — R479 Unspecified speech disturbances: Secondary | ICD-10-CM | POA: Diagnosis not present

## 2015-11-19 DIAGNOSIS — Z87891 Personal history of nicotine dependence: Secondary | ICD-10-CM | POA: Insufficient documentation

## 2015-11-19 DIAGNOSIS — R269 Unspecified abnormalities of gait and mobility: Secondary | ICD-10-CM

## 2015-11-19 LAB — DIFFERENTIAL
Basophils Absolute: 0 10*3/uL (ref 0.0–0.1)
Basophils Relative: 0 %
EOS PCT: 2 %
Eosinophils Absolute: 0.1 10*3/uL (ref 0.0–0.7)
LYMPHS PCT: 30 %
Lymphs Abs: 1.6 10*3/uL (ref 0.7–4.0)
Monocytes Absolute: 0.5 10*3/uL (ref 0.1–1.0)
Monocytes Relative: 9 %
NEUTROS ABS: 3.3 10*3/uL (ref 1.7–7.7)
NEUTROS PCT: 59 %

## 2015-11-19 LAB — APTT: aPTT: 30 seconds (ref 24–37)

## 2015-11-19 LAB — COMPREHENSIVE METABOLIC PANEL
ALBUMIN: 3.8 g/dL (ref 3.5–5.0)
ALT: 16 U/L — AB (ref 17–63)
ANION GAP: 7 (ref 5–15)
AST: 21 U/L (ref 15–41)
Alkaline Phosphatase: 81 U/L (ref 38–126)
BUN: 20 mg/dL (ref 6–20)
CO2: 26 mmol/L (ref 22–32)
Calcium: 9.4 mg/dL (ref 8.9–10.3)
Chloride: 106 mmol/L (ref 101–111)
Creatinine, Ser: 1.27 mg/dL — ABNORMAL HIGH (ref 0.61–1.24)
GFR calc Af Amer: 58 mL/min — ABNORMAL LOW (ref >60)
GFR calc non Af Amer: 50 mL/min — ABNORMAL LOW (ref >60)
GLUCOSE: 110 mg/dL — AB (ref 65–99)
Potassium: 4.8 mmol/L (ref 3.5–5.1)
SODIUM: 139 mmol/L (ref 135–145)
Total Bilirubin: 0.7 mg/dL (ref 0.3–1.2)
Total Protein: 6.9 g/dL (ref 6.5–8.1)

## 2015-11-19 LAB — I-STAT CHEM 8, ED
BUN: 26 mg/dL — ABNORMAL HIGH (ref 6–20)
CHLORIDE: 104 mmol/L (ref 101–111)
Calcium, Ion: 1.15 mmol/L (ref 1.13–1.30)
Creatinine, Ser: 1.1 mg/dL (ref 0.61–1.24)
GLUCOSE: 107 mg/dL — AB (ref 65–99)
HEMATOCRIT: 42 % (ref 39.0–52.0)
Hemoglobin: 14.3 g/dL (ref 13.0–17.0)
POTASSIUM: 4.8 mmol/L (ref 3.5–5.1)
Sodium: 139 mmol/L (ref 135–145)
TCO2: 23 mmol/L (ref 0–100)

## 2015-11-19 LAB — CBC
HCT: 39.7 % (ref 39.0–52.0)
Hemoglobin: 12.8 g/dL — ABNORMAL LOW (ref 13.0–17.0)
MCH: 32.1 pg (ref 26.0–34.0)
MCHC: 32.2 g/dL (ref 30.0–36.0)
MCV: 99.5 fL (ref 78.0–100.0)
PLATELETS: 138 10*3/uL — AB (ref 150–400)
RBC: 3.99 MIL/uL — ABNORMAL LOW (ref 4.22–5.81)
RDW: 12.9 % (ref 11.5–15.5)
WBC: 5.5 10*3/uL (ref 4.0–10.5)

## 2015-11-19 LAB — I-STAT TROPONIN, ED: Troponin i, poc: 0.01 ng/mL (ref 0.00–0.08)

## 2015-11-19 LAB — PROTIME-INR
INR: 1.25 (ref 0.00–1.49)
Prothrombin Time: 15.9 seconds — ABNORMAL HIGH (ref 11.6–15.2)

## 2015-11-19 LAB — CBG MONITORING, ED: GLUCOSE-CAPILLARY: 98 mg/dL (ref 65–99)

## 2015-11-19 MED ORDER — TAMSULOSIN HCL 0.4 MG PO CAPS: 0.4000 mg | ORAL_CAPSULE | Freq: Every day | ORAL | Status: DC

## 2015-11-19 MED ORDER — CITALOPRAM HYDROBROMIDE 10 MG PO TABS
10.0000 mg | ORAL_TABLET | Freq: Every day | ORAL | Status: DC
  Filled 2015-11-19: qty 1

## 2015-11-19 MED ORDER — STROKE: EARLY STAGES OF RECOVERY BOOK
Freq: Once | Status: AC
  Administered 2015-11-19: 19:00:00
  Filled 2015-11-19: qty 1

## 2015-11-19 MED ORDER — SODIUM CHLORIDE 0.9 % IJ SOLN
3.0000 mL | Freq: Two times a day (BID) | INTRAMUSCULAR | Status: DC
  Administered 2015-11-19 – 2015-11-20 (×2): 3 mL via INTRAVENOUS

## 2015-11-19 MED ORDER — INSULIN ASPART 100 UNIT/ML ~~LOC~~ SOLN: 0.0000 [IU] | Freq: Every day | SUBCUTANEOUS | Status: DC

## 2015-11-19 MED ORDER — SODIUM CHLORIDE 0.9 % IV SOLN: 250.0000 mL | INTRAVENOUS | Status: DC | PRN

## 2015-11-19 MED ORDER — IOHEXOL 350 MG/ML SOLN
100.0000 mL | Freq: Once | INTRAVENOUS | Status: AC | PRN
  Administered 2015-11-19: 100 mL via INTRAVENOUS

## 2015-11-19 MED ORDER — SODIUM CHLORIDE 0.9 % IJ SOLN: 3.0000 mL | INTRAMUSCULAR | Status: DC | PRN

## 2015-11-19 MED ORDER — ONDANSETRON HCL 4 MG PO TABS: 4.0000 mg | ORAL_TABLET | Freq: Four times a day (QID) | ORAL | Status: DC | PRN

## 2015-11-19 MED ORDER — INSULIN ASPART 100 UNIT/ML ~~LOC~~ SOLN: 0.0000 [IU] | Freq: Three times a day (TID) | SUBCUTANEOUS | Status: DC

## 2015-11-19 MED ORDER — ONDANSETRON HCL 4 MG/2ML IJ SOLN: 4.0000 mg | Freq: Four times a day (QID) | INTRAMUSCULAR | Status: DC | PRN

## 2015-11-19 MED ORDER — ATORVASTATIN CALCIUM 10 MG PO TABS
20.0000 mg | ORAL_TABLET | Freq: Every day | ORAL | Status: DC
  Administered 2015-11-19: 20 mg via ORAL
  Filled 2015-11-19: qty 2

## 2015-11-19 MED ORDER — VALACYCLOVIR HCL 500 MG PO TABS
1000.0000 mg | ORAL_TABLET | Freq: Two times a day (BID) | ORAL | Status: DC
  Filled 2015-11-19: qty 2

## 2015-11-19 MED ORDER — ASPIRIN 325 MG PO TABS: 325.0000 mg | ORAL_TABLET | Freq: Every day | ORAL | Status: DC

## 2015-11-19 MED ORDER — SODIUM CHLORIDE 0.9 % IJ SOLN
3.0000 mL | Freq: Two times a day (BID) | INTRAMUSCULAR | Status: DC
  Administered 2015-11-19: 3 mL via INTRAVENOUS

## 2015-11-19 MED ORDER — ASPIRIN 300 MG RE SUPP: 300.0000 mg | Freq: Every day | RECTAL | Status: DC

## 2015-11-19 MED ORDER — VALACYCLOVIR HCL 500 MG PO TABS: 500.0000 mg | ORAL_TABLET | Freq: Two times a day (BID) | ORAL | Status: DC

## 2015-11-19 MED ORDER — APIXABAN 5 MG PO TABS
5.0000 mg | ORAL_TABLET | Freq: Two times a day (BID) | ORAL | Status: DC
  Administered 2015-11-19 – 2015-11-20 (×2): 5 mg via ORAL
  Filled 2015-11-19 (×2): qty 1

## 2015-11-19 MED ORDER — HYDRALAZINE HCL 20 MG/ML IJ SOLN: 5.0000 mg | INTRAMUSCULAR | Status: DC | PRN

## 2015-11-19 NOTE — ED Notes (Signed)
Attempted to call report

## 2015-11-19 NOTE — H&P (Signed)
[facility] History and Physical  [name] XTG:[id number] DOB: [birth year] DOA: 11/19/2015  Referring physician: Dr. Ashley Akin ED PCP: Marton Redwood, MD   Chief Complaint: Slurred speech.   HPI: Ross Morgan is a 82 y.o. male   Patient family reports acute onset slurred speech and left facial droop which began yesterday morning while patient was reaching into his closet. This was associated with a brief episode of dizziness. Over the course the day patient's symptoms began to resolve. Patient awoke this morning when symptoms again came on acutely. At that point patient presented to [facility] emergency department. Patient denies any vision change, any dizziness with head movement, LOC, nausea, vomiting, chest pain, palpitations. Patient denies any falls or head trauma. Currently has headache. Patient reports compliance with his L question has a pacemaker in place. Denies any history of A. fib though it is listed in his chart back several years. Nothing makes his symptoms better or worse. Symptoms are constant but improving. Denies any recent URI or viral infections.    Review of Systems:  Constitutional:  No weight loss, night sweats, Fevers, chills, fatigue.  HEENT:  No Difficulty swallowing,Tooth/dental problems,Sore throat, Cardio-vascular:  No chest pain, Orthopnea, PND, swelling in lower extremities, anasarca,  GI:  No heartburn, indigestion, abdominal pain, nausea, vomiting, diarrhea, change in bowel habits, loss of appetite  Resp:   No shortness of breath with exertion or at rest. No excess mucus, no productive cough, No non-productive cough, No coughing up of blood.No change in color of mucus.No wheezing.No chest wall deformity  Skin:  no rash or lesions.  GU:  no dysuria, change in color of urine, no urgency or frequency. No flank pain.  Musculoskeletal:   No joint pain or swelling. No decreased range of motion. No back pain.  Psych:  No change in mood or affect.  No depression or anxiety. No memory loss.  Neuro: Per HPi  All other systems were reviewed and are negative.  Past Medical History  Diagnosis Date  . Hypertension   . Hyperlipidemia   . Anal fissure   . Knee pain 2015    "tore tendon left knee; no OR"  . Bradycardia   . Atrial fibrillation ([place])   . Type II diabetes mellitus ([place])   . Arthritis     "left pointer" (05/30/2014)  . Depression   . Symptomatic bradycardia     status post dual AV permanent transvenous pacemaker insertion  . On continuous oral anticoagulation    Past Surgical History  Procedure Laterality Date  . Cataract extraction w/ intraocular lens  implant, bilateral Bilateral 2000's  . Pacemaker insertion  05/31/14    STJ dual chamber pacemaker implanted by Dr Lovena Le for symptomatic bradycardia  . Permanent pacemaker insertion N/A 05/31/2014    Procedure: PERMANENT PACEMAKER INSERTION;  Surgeon: Evans Lance, MD;  Location: [facility] CATH LAB;  Service: Cardiovascular;  Laterality: N/A;   Social History:  reports that he has quit smoking. His smoking use included Cigarettes. He quit after 1 year of use. He has never used smokeless tobacco. He reports that he drinks alcohol. He reports that he does not use illicit drugs.  No Known Allergies  Family History  Problem Relation Age of Onset  . Heart attack Neg Hx      Prior to Admission medications   Medication Sig Start Date End Date Taking? Authorizing Provider  amLODipine (NORVASC) 10 MG tablet Take 10 mg by mouth daily.    Historical Provider, MD  apixaban (ELIQUIS) 5 MG TABS tablet Take 1 tablet (5 mg total) by mouth 2 (two) times daily. 09/11/15   Evans Lance, MD  atorvastatin (LIPITOR) 20 MG tablet Take 20 mg by mouth at bedtime. 06/22/15   Historical Provider, MD  citalopram (CELEXA) 10 MG tablet Take 10 mg by mouth daily.    Historical Provider, MD  Coenzyme Q10 (CO Q-10) 200 MG CAPS Take 1 capsule by mouth daily.     Historical Provider, MD  fluorouracil  (EFUDEX) 5 % cream Apply 1 application topically daily as needed (for irritation).    Historical Provider, MD  glucose blood test strip 1 each by Other route as needed for other. Use as instructed    Historical Provider, MD  lidocaine (XYLOCAINE) 5 % ointment Apply 1 application topically as needed. Patient taking differently: Apply 1 application topically as needed (pain).  2/50/53   Leighton Ruff, MD  Lidocaine-Hydrocortisone Ace (ANAMANTLE HC) 3-0.5 % KIT Place 1 application rectally daily as needed (for irritation).     Historical Provider, MD  Multiple Vitamin (MULTIVITAMIN) capsule Take 1 capsule by mouth daily.    Historical Provider, MD  NON FORMULARY Place 1 drop into the left eye at bedtime. [employer] lubricant eye drops qhs    Historical Provider, MD  NON FORMULARY Place 1 application into the right eye at bedtime. Mura 5% eye ointment in right eye qhs    Historical Provider, MD  pimecrolimus (ELIDEL) 1 % cream Apply 1 application topically daily as needed.     Historical Provider, MD  polyethylene glycol (MIRALAX / GLYCOLAX) packet Take 17 g by mouth every morning.    Historical Provider, MD  Red Yeast Rice 600 MG CAPS Take 1,200 mg by mouth 2 (two) times daily.     Historical Provider, MD  Simethicone (GAS-X PO) Take 1 tablet by mouth daily as needed (for gas relief).     Historical Provider, MD  sitaGLIPtin-metformin (JANUMET) 50-1000 MG per tablet Take 1 tablet by mouth 2 (two) times daily with a meal.    Historical Provider, MD  Sodium Bicarbonate-Citric Acid (ALKA-SELTZER HEARTBURN PO) Take 1 tablet by mouth at bedtime.    Historical Provider, MD  tamsulosin (FLOMAX) 0.4 MG CAPS capsule Take 0.4 mg by mouth at bedtime.     Historical Provider, MD  valsartan (DIOVAN) 160 MG tablet Take 160 mg by mouth daily.    Historical Provider, MD  vitamin C (ASCORBIC ACID) 500 MG tablet Take 500 mg by mouth daily.    Historical Provider, MD   Physical Exam: Filed Vitals:   11/19/15 1430 11/19/15  1445 11/19/15 1500 11/19/15 1530  BP: 135/71 125/64 136/79 129/66  Pulse: 75 70 73 73  Temp:      TempSrc:      Resp: '12 19 21 18  '$ SpO2: 98% 98% 97% 99%    Wt Readings from Last 3 Encounters:  07/12/15 81.557 kg (179 lb 12.8 oz)  11/17/14 81.874 kg (180 lb 8 oz)  09/29/14 82.555 kg (182 lb)    General:  Appears calm and comfortable Eyes:  PERRL, EOMI, normal lids, iris ENT:  grossly normal hearing, lips & tongue, TMs normal bilaterally, significant amount of cerumen bilaterally without complete occlusion Neck:  no LAD, masses or thyromegaly Cardiovascular:  RRR, no m/r/g. No LE edema.  Respiratory:  CTA bilaterally, no w/r/r. Normal respiratory effort. Abdomen:  soft, ntnd Skin:  no rash or induration seen on limited exam Musculoskeletal:  grossly normal tone BUE/BLE  Psychiatric:  grossly normal mood and affect, speech fluent and appropriate Neurologic: With the exception of slight slurring of speech intermittently patient's CN II through XII intact, grip strength 5 out of 5 bilateral, moves all extremities and coordinated fashion, no dysmetria,           Labs on Admission:  Basic Metabolic Panel:  Recent Labs Lab 11/19/15 1336 11/19/15 1345  NA 139 139  K 4.8 4.8  CL 106 104  CO2 26  --   GLUCOSE 110* 107*  BUN 20 26*  CREATININE 1.27* 1.10  CALCIUM 9.4  --    Liver Function Tests:  Recent Labs Lab 11/19/15 1336  AST 21  ALT 16*  ALKPHOS 81  BILITOT 0.7  PROT 6.9  ALBUMIN 3.8   No results for input(s): LIPASE, AMYLASE in the last 168 hours. No results for input(s): AMMONIA in the last 168 hours. CBC:  Recent Labs Lab 11/19/15 1336 11/19/15 1345  WBC 5.5  --   NEUTROABS 3.3  --   HGB 12.8* 14.3  HCT 39.7 42.0  MCV 99.5  --   PLT 138*  --    Cardiac Enzymes: No results for input(s): CKTOTAL, CKMB, CKMBINDEX, TROPONINI in the last 168 hours.  BNP (last 3 results) No results for input(s): BNP in the last 8760 hours.  ProBNP (last 3  results) No results for input(s): PROBNP in the last 8760 hours.   Creatinine clearance cannot be calculated (Unknown ideal weight.)  CBG:  Recent Labs Lab 11/19/15 1339  GLUCAP 98    Radiological Exams on Admission: Ct Head Wo Contrast  11/19/2015  CLINICAL DATA:  Slurred speech.  Facial droop. EXAM: CT HEAD WITHOUT CONTRAST TECHNIQUE: Contiguous axial images were obtained from the base of the skull through the vertex without intravenous contrast. COMPARISON:  07/08/2008 FINDINGS: There is low attenuation throughout the subcortical and periventricular white matter compatible with chronic microvascular disease. Prominence of the sulci and ventricles noted consistent with brain atrophy. The paranasal sinuses are clear. The mastoid air cells are clear. The calvarium is intact. IMPRESSION: 1. No acute intracranial abnormalities. 2. Chronic micro vascular disease and brain atrophy. Electronically Signed   By: Kerby Moors M.D.   On: 11/19/2015 14:19     Assessment/Plan Active Problems:   Essential hypertension   Hyperlipidemia   Bradycardia, sinus   Type II diabetes mellitus (HCC)   Pacemaker   Stroke (cerebrum) (HCC)   Paroxysmal a-fib (HCC)   BPH (benign prostatic hyperplasia)  Stroke/TIA: slurring of speech. CT without evidence of acute stroke. Patient unable to have MRI due to pacemaker. Patient endorses compliance with Eliquis. Dr.Nandigam, of neurology following an appreciate their assessment. - Telemetry/observation - Typical stroke workup ordered by neurology including CTA head and neck - Echo - PT/OT - Permissive hypertension - Per neurology, they will treat for vestibular neuritis - Start ASA  Paroxysmal Afib: - continue eliquis, bblocker  Bradycardia syndrome: pacemaker in place. Asymptomatic - montiro  HLD: - continue lipitor ('20mg'$ ) - Lipid panel   DM: on Janumet only - SSI - A1c  HTN: - Restart home BP meds upon DC - allow permissive HTN - Hydralazine  PRN SBP >210, DBP > 110  Depression: - continue celexa  BPH: - continue flomax   Code Status: FULL  DVT Prophylaxis: Eliquis Family Communication: Several family members Disposition Plan: Pending Improvement    Ross Cutler Lenna Sciara, MD Family Medicine [facility] [email or web address] Password [email or web address]

## 2015-11-19 NOTE — ED Provider Notes (Addendum)
CSN: [id number]     Arrival date & time 11/19/15  1310 History   First MD Initiated Contact with Patient 11/19/15 1333     Chief Complaint  Patient presents with  . Stroke Symptoms     (Consider location/radiation/quality/duration/timing/severity/associated sxs/prior Treatment) The history is provided by the patient and the spouse.   82 year old man with slurred speech and some left facial drooping that began yesterday morning. He states that it continued through the day yesterday but he feels like it got better last night. He states it began again today. He denies any weakness in his hands or difficulty walking. He denies any vision changes. He has not fallen or struck his head. Nursing notes state that the slight headache but he did not endorse that to me. Review his history of stroke. He is on eliquis and states he has a pacemaker. Medical history as listed in his chart states a history of atrial fibrillation. Past Medical History  Diagnosis Date  . Hypertension   . Hyperlipidemia   . Anal fissure   . Knee pain 2015    "tore tendon left knee; no OR"  . Bradycardia   . Atrial fibrillation ([place])   . Type II diabetes mellitus ([place])   . Arthritis     "left pointer" (05/30/2014)  . Depression   . Symptomatic bradycardia     status post dual AV permanent transvenous pacemaker insertion  . On continuous oral anticoagulation    Past Surgical History  Procedure Laterality Date  . Cataract extraction w/ intraocular lens  implant, bilateral Bilateral 2000's  . Pacemaker insertion  05/31/14    STJ dual chamber pacemaker implanted by Dr Lovena Le for symptomatic bradycardia  . Permanent pacemaker insertion N/A 05/31/2014    Procedure: PERMANENT PACEMAKER INSERTION;  Surgeon: Evans Lance, MD;  Location[facility] CATH LAB;  Service: Cardiovascular;  Laterality: N/A;   Family History  Problem Relation Age of Onset  . Heart attack Neg Hx    Social History  Substance Use Topics  . Smoking status:  Former Smoker -- 1 years    Types: Cigarettes  . Smokeless tobacco: Never Used     Comment: "quit smoking in ~ 1952  . Alcohol Use: Yes     Comment: 05/30/2014 "glass of wine ~ once/month"    Review of Systems  All other systems reviewed and are negative.     Allergies  Review of patient's allergies indicates no known allergies.  Home Medications   Prior to Admission medications   Medication Sig Start Date End Date Taking? Authorizing Provider  amLODipine (NORVASC) 10 MG tablet Take 10 mg by mouth daily.    Historical Provider, MD  apixaban (ELIQUIS) 5 MG TABS tablet Take 1 tablet (5 mg total) by mouth 2 (two) times daily. 09/11/15   Evans Lance, MD  atorvastatin (LIPITOR) 20 MG tablet Take 20 mg by mouth at bedtime. 06/22/15   Historical Provider, MD  citalopram (CELEXA) 10 MG tablet Take 10 mg by mouth daily.    Historical Provider, MD  Coenzyme Q10 (CO Q-10) 200 MG CAPS Take 1 capsule by mouth daily.     Historical Provider, MD  fluorouracil (EFUDEX) 5 % cream Apply 1 application topically daily as needed (for irritation).    Historical Provider, MD  glucose blood test strip 1 each by Other route as needed for other. Use as instructed    Historical Provider, MD  lidocaine (XYLOCAINE) 5 % ointment Apply 1 application topically as needed.  Patient taking differently: Apply 1 application topically as needed (pain).  06/27/14   Romie Levee, MD  Lidocaine-Hydrocortisone Ace (ANAMANTLE HC) 3-0.5 % KIT Place 1 application rectally daily as needed (for irritation).     Historical Provider, MD  Multiple Vitamin (MULTIVITAMIN) capsule Take 1 capsule by mouth daily.    Historical Provider, MD  NON FORMULARY Place 1 drop into the left eye at bedtime. [employer]S lubricant eye drops qhs    Historical Provider, MD  NON FORMULARY Place 1 application into the right eye at bedtime. Mura 5% eye ointment in right eye qhs    Historical Provider, MD  pimecrolimus (ELIDEL) 1 % cream Apply 1 application  topically daily as needed.     Historical Provider, MD  polyethylene glycol (MIRALAX / GLYCOLAX) packet Take 17 g by mouth every morning.    Historical Provider, MD  Red Yeast Rice 600 MG CAPS Take 1,200 mg by mouth 2 (two) times daily.     Historical Provider, MD  Simethicone (GAS-X PO) Take 1 tablet by mouth daily as needed (for gas relief).     Historical Provider, MD  sitaGLIPtin-metformin (JANUMET) 50-1000 MG per tablet Take 1 tablet by mouth 2 (two) times daily with a meal.    Historical Provider, MD  Sodium Bicarbonate-Citric Acid (ALKA-SELTZER HEARTBURN PO) Take 1 tablet by mouth at bedtime.    Historical Provider, MD  tamsulosin (FLOMAX) 0.4 MG CAPS capsule Take 0.4 mg by mouth at bedtime.     Historical Provider, MD  valsartan (DIOVAN) 160 MG tablet Take 160 mg by mouth daily.    Historical Provider, MD  vitamin C (ASCORBIC ACID) 500 MG tablet Take 500 mg by mouth daily.    Historical Provider, MD   BP 129/77 mmHg  Pulse 72  Temp(Src) 98.3 F (36.8 C) (Oral)  Resp 21  SpO2 96% Physical Exam  Constitutional: He is oriented to person, place, and time. He appears well-developed and well-nourished.  HENT:  Head: Normocephalic and atraumatic.  Right Ear: Tympanic membrane and external ear normal.  Left Ear: Tympanic membrane and external ear normal.  Nose: Nose normal. Right sinus exhibits no maxillary sinus tenderness and no frontal sinus tenderness. Left sinus exhibits no maxillary sinus tenderness and no frontal sinus tenderness.  Eyes: Conjunctivae and EOM are normal. Pupils are equal, round, and reactive to light. Right eye exhibits no nystagmus. Left eye exhibits no nystagmus.  Neck: Normal range of motion. Neck supple.  Cardiovascular: Normal rate, regular rhythm, normal heart sounds and intact distal pulses.   Pulmonary/Chest: Effort normal and breath sounds normal. No respiratory distress. He exhibits no tenderness.  Abdominal: Soft. Bowel sounds are normal. He exhibits no  distension and no mass. There is no tenderness.  Musculoskeletal: Normal range of motion. He exhibits no edema or tenderness.  Neurological: He is alert and oriented to person, place, and time. He has normal strength and normal reflexes. No sensory deficit. He displays a negative Romberg sign. GCS eye subscore is 4. GCS verbal subscore is 5. GCS motor subscore is 6.  Reflex Scores:      Tricep reflexes are 2+ on the right side and 2+ on the left side.      Bicep reflexes are 2+ on the right side and 2+ on the left side.      Brachioradialis reflexes are 2+ on the right side and 2+ on the left side.      Patellar reflexes are 2+ on the right side and 2+ on  the left side.      Achilles reflexes are 2+ on the right side and 2+ on the left side. Left mouth droop  Muscle strength is 5/5 in bilateral shoulders, elbow flexor and extensors, wrist flexor and extensors, and intrinsic hand muscles. 5/5 bilateral lower extremity hip flexors, extensors, knee flexors and extensors, and ankle dorsi and plantar flexors.    Skin: Skin is warm and dry. No rash noted.  Psychiatric: He has a normal mood and affect. His behavior is normal. Judgment and thought content normal.  Nursing note and vitals reviewed.   ED Course  Procedures (including critical care time) Labs Review Labs Reviewed  PROTIME-INR - Abnormal; Notable for the following:    Prothrombin Time 15.9 (*)    All other components within normal limits  CBC - Abnormal; Notable for the following:    RBC 3.99 (*)    Hemoglobin 12.8 (*)    Platelets 138 (*)    All other components within normal limits  COMPREHENSIVE METABOLIC PANEL - Abnormal; Notable for the following:    Glucose, Bld 110 (*)    Creatinine, Ser 1.27 (*)    ALT 16 (*)    GFR calc non Af Amer 50 (*)    GFR calc Af Amer 58 (*)    All other components within normal limits  I-STAT CHEM 8, ED - Abnormal; Notable for the following:    BUN 26 (*)    Glucose, Bld 107 (*)    All  other components within normal limits  APTT  DIFFERENTIAL  I-STAT TROPOININ, ED  CBG MONITORING, ED    Imaging Review Ct Head Wo Contrast  11/19/2015  CLINICAL DATA:  Slurred speech.  Facial droop. EXAM: CT HEAD WITHOUT CONTRAST TECHNIQUE: Contiguous axial images were obtained from the base of the skull through the vertex without intravenous contrast. COMPARISON:  07/08/2008 FINDINGS: There is low attenuation throughout the subcortical and periventricular white matter compatible with chronic microvascular disease. Prominence of the sulci and ventricles noted consistent with brain atrophy. The paranasal sinuses are clear. The mastoid air cells are clear. The calvarium is intact. IMPRESSION: 1. No acute intracranial abnormalities. 2. Chronic micro vascular disease and brain atrophy. Electronically Signed   By: Kerby Moors M.D.   On: 11/19/2015 14:19   I have personally reviewed and evaluated these images and lab results as part of my medical decision-making. ED ECG REPORT   Date: 11/19/2015  Rate: 76  Rhythm: atrial fibrillation  QRS Axis: normal  Intervals: a fib  ST/T Wave abnormalities: nonspecific T wave changes and nonspecific ST/T changes  Conduction Disutrbances:lbb, t wave inversion in v3-v6  Narrative Interpretation:   Old EKG Reviewed: none available  I have personally reviewed the EKG tracing and agree with the computerized printout as noted.   MDM   Final diagnoses:  Cerebrovascular accident (CVA), unspecified mechanism ([place])   Discussed with Dr. Juliann Mule and he will be in to see.  He plans ct angio head and neck and admission for further evaluation.   Patient unable to have mri, per Dr. Juliann Mule, due to pacer.  Hospitalist consulted for admission.   Pattricia Boss, MD 11/19/15 [id number]  Pattricia Boss, MD 11/25/15 2337

## 2015-11-19 NOTE — Consult Note (Signed)
Requesting Physician: Dr.  Jeanell Sparrow, in ER    Reason fo consultation:   Two episodes of acute vertigo, gait instability, slurred speech ; evaluate for acute stroke  HPI:                                                                                                                                         Ross Morgan is an 82 y.o. male patient who presented to the ER following 2 episodes of acute vertigo. He had the first episode yesterday after lunch when he tried to stand appear intense vertigo symptoms with gait instability. He sat down and eventually after about an hour he started feeling better. He was noted to have some speech trouble with mild slurred speech noted by his family around that time. No definite evidence of any facial droop, vision symptoms or any focal sensorimotor the medicine the limbs at that time. He continued to do well last night that the symptoms. Today afternoon he had recurrence of similar episode of intense vertigo, gait instability and possibly some speech difficulty noted by family. With this episode was short lasting, resolved in about 10 minutes or so. Due to recurrence of these episodes, he was brought into the ER for further evaluation. At this time he denies any significant vertigo symptoms, no vision speech problems or sensorimotor symptoms in the limbs.  Date last known well:  11/18/2015 Time last known well:  Prior to lunch time, patient unable to specify the time precisely tPA Given: No: Outside the time window, symptoms resolved. NIHSS 0.   Stroke Risk Factors - atrial fibrillation, diabetes mellitus, hyperlipidemia and hypertension  Past Medical History  Diagnosis Date  . Hypertension   . Hyperlipidemia   . Anal fissure   . Knee pain 2015    "tore tendon left knee; no OR"  . Bradycardia   . Atrial fibrillation ([place])   . Type II diabetes mellitus ([place])   . Arthritis     "left pointer" (05/30/2014)  . Depression   . Symptomatic bradycardia     status post  dual AV permanent transvenous pacemaker insertion  . On continuous oral anticoagulation     Past Surgical History  Procedure Laterality Date  . Cataract extraction w/ intraocular lens  implant, bilateral Bilateral 2000's  . Pacemaker insertion  05/31/14    STJ dual chamber pacemaker implanted by Dr Lovena Le for symptomatic bradycardia  . Permanent pacemaker insertion N/A 05/31/2014    Procedure: PERMANENT PACEMAKER INSERTION;  Surgeon: Evans Lance, MD;  Location: [facility] CATH LAB;  Service: Cardiovascular;  Laterality: N/A;    Family History  Problem Relation Age of Onset  . Heart attack Neg Hx    Social History:  reports that he has quit smoking. His smoking use included Cigarettes. He quit after 1 year of use. He has never used smokeless tobacco. He reports that he drinks alcohol. He  reports that he does not use illicit drugs.  Allergies: No Known Allergies  Medications:                                                                                                                          Current facility-administered medications:  .  valACYclovir (VALTREX) tablet 500 mg, 500 mg, Oral, BID, [name], MD  Current outpatient prescriptions:  .  amLODipine (NORVASC) 10 MG tablet, Take 10 mg by mouth daily., Disp: , Rfl:  .  apixaban (ELIQUIS) 5 MG TABS tablet, Take 1 tablet (5 mg total) by mouth 2 (two) times daily., Disp: 60 tablet, Rfl: 9 .  atorvastatin (LIPITOR) 20 MG tablet, Take 20 mg by mouth at bedtime., Disp: , Rfl: 6 .  citalopram (CELEXA) 10 MG tablet, Take 10 mg by mouth daily., Disp: , Rfl:  .  Coenzyme Q10 (CO Q-10) 200 MG CAPS, Take 1 capsule by mouth daily. , Disp: , Rfl:  .  fluorouracil (EFUDEX) 5 % cream, Apply 1 application topically daily as needed (for irritation)., Disp: , Rfl:  .  glucose blood test strip, 1 each by Other route as needed for other. Use as instructed, Disp: , Rfl:  .  lidocaine (XYLOCAINE) 5 % ointment, Apply 1 application  topically as needed. (Patient taking differently: Apply 1 application topically as needed (pain). ), Disp: 35.44 g, Rfl: 2 .  Lidocaine-Hydrocortisone Ace (ANAMANTLE HC) 3-0.5 % KIT, Place 1 application rectally daily as needed (for irritation). , Disp: , Rfl:  .  Multiple Vitamin (MULTIVITAMIN) capsule, Take 1 capsule by mouth daily., Disp: , Rfl:  .  NON FORMULARY, Place 1 drop into the left eye at bedtime. CVS lubricant eye drops qhs, Disp: , Rfl:  .  NON FORMULARY, Place 1 application into the right eye at bedtime. Mura 5% eye ointment in right eye qhs, Disp: , Rfl:  .  pimecrolimus (ELIDEL) 1 % cream, Apply 1 application topically daily as needed. , Disp: , Rfl:  .  polyethylene glycol (MIRALAX / GLYCOLAX) packet, Take 17 g by mouth every morning., Disp: , Rfl:  .  Red Yeast Rice 600 MG CAPS, Take 1,200 mg by mouth 2 (two) times daily. , Disp: , Rfl:  .  Simethicone (GAS-X PO), Take 1 tablet by mouth daily as needed (for gas relief). , Disp: , Rfl:  .  sitaGLIPtin-metformin (JANUMET) 50-1000 MG per tablet, Take 1 tablet by mouth 2 (two) times daily with a meal., Disp: , Rfl:  .  Sodium Bicarbonate-Citric Acid (ALKA-SELTZER HEARTBURN PO), Take 1 tablet by mouth at bedtime., Disp: , Rfl:  .  tamsulosin (FLOMAX) 0.4 MG CAPS capsule, Take 0.4 mg by mouth at bedtime. , Disp: , Rfl:  .  valsartan (DIOVAN) 160 MG tablet, Take 160 mg by mouth daily., Disp: , Rfl:  .  vitamin C (ASCORBIC ACID) 500 MG tablet, Take 500 mg by mouth daily., Disp: , Rfl:    ROS:  History obtained from the patient  General ROS: negative for - chills, fatigue, fever, night sweats, weight gain or weight loss Psychological ROS: negative for - behavioral disorder, hallucinations, memory difficulties, mood swings or suicidal ideation Ophthalmic ROS: negative for - blurry vision, double vision,  eye pain or loss of vision ENT ROS: negative for - epistaxis, nasal discharge, oral lesions, sore throat, tinnitus or vertigo Allergy and Immunology ROS: negative for - hives or itchy/watery eyes Hematological and Lymphatic ROS: negative for - bleeding problems, bruising or swollen lymph nodes Endocrine ROS: negative for - galactorrhea, hair pattern changes, polydipsia/polyuria or temperature intolerance Respiratory ROS: negative for - cough, hemoptysis, shortness of breath or wheezing Cardiovascular ROS: negative for - chest pain, dyspnea on exertion, edema or irregular heartbeat Gastrointestinal ROS: negative for - abdominal pain, diarrhea, hematemesis, nausea/vomiting or stool incontinence Genito-Urinary ROS: negative for - dysuria, hematuria, incontinence or urinary frequency/urgency Musculoskeletal ROS: negative for - joint swelling or muscular weakness Neurological ROS: as noted in HPI Dermatological ROS: negative for rash and skin lesion changes  Neurologic Examination:                                                                                                      Blood pressure 129/66, pulse 73, temperature 98.3 F (36.8 C), temperature source Oral, resp. rate 18, SpO2 99 %. Otoscopic examination revealed an erythematous tender HSV vesicular lesion in the right ear canal.   Evaluation of higher integrative functions including: Level of alertness: Alert,  Oriented to time, place and person Recent and remote memory - appropriate for age Attention span and concentration  - intact   Speech: fluent, no evidence of dysarthria or aphasia noted.  Test the following cranial nerves: 2-12 grossly intact Motor examination: Normal tone, bulk, full 5/5 motor strength in all 4 extremities Examination of sensation : Normal and symmetric sensation to pinprick proximally in all 4 extremities and on face, mild distal sensory loss from diabetic polyneuropathy Examination of deep tendon reflexes:  2+, and symmetric in upper extremities, 1+ at knees, absent ankles ,normal plantars bilaterally Test coordination: Normal finger nose testing, with no evidence of limb appendicular ataxia or abnormal involuntary movements or tremors noted.  Gait: Mild gait instability especially with the turns, wide-based   Lab Results: Basic Metabolic Panel:  Recent Labs Lab 11/19/15 1336 11/19/15 1345  NA 139 139  K 4.8 4.8  CL 106 104  CO2 26  --   GLUCOSE 110* 107*  BUN 20 26*  CREATININE 1.27* 1.10  CALCIUM 9.4  --     Liver Function Tests:  Recent Labs Lab 11/19/15 1336  AST 21  ALT 16*  ALKPHOS 81  BILITOT 0.7  PROT 6.9  ALBUMIN 3.8   No results for input(s): LIPASE, AMYLASE in the last 168 hours. No results for input(s): AMMONIA in the last 168 hours.  CBC:  Recent Labs Lab 11/19/15 1336 11/19/15 1345  WBC 5.5  --   NEUTROABS 3.3  --   HGB 12.8* 14.3  HCT 39.7 42.0  MCV 99.5  --  PLT 138*  --     Cardiac Enzymes: No results for input(s): CKTOTAL, CKMB, CKMBINDEX, TROPONINI in the last 168 hours.  Lipid Panel: No results for input(s): CHOL, TRIG, HDL, CHOLHDL, VLDL, LDLCALC in the last 168 hours.  CBG:  Recent Labs Lab 11/19/15 6016  DEKIYJ 49    Microbiology: No results found for this or any previous visit.   Imaging: Ct Head Wo Contrast  11/19/2015  CLINICAL DATA:  Slurred speech.  Facial droop. EXAM: CT HEAD WITHOUT CONTRAST TECHNIQUE: Contiguous axial images were obtained from the base of the skull through the vertex without intravenous contrast. COMPARISON:  07/08/2008 FINDINGS: There is low attenuation throughout the subcortical and periventricular white matter compatible with chronic microvascular disease. Prominence of the sulci and ventricles noted consistent with brain atrophy. The paranasal sinuses are clear. The mastoid air cells are clear. The calvarium is intact. IMPRESSION: 1. No acute intracranial abnormalities. 2. Chronic micro vascular  disease and brain atrophy. Electronically Signed   By: Kerby Moors M.D.   On: 11/19/2015 14:19   CTA head and neck Atrophy and chronic microvascular ischemia. No acute intracranial Abnormality. Prominent ulceration of the right carotid bulb which may be a source of emboli. No significant stenosis of the carotid or vertebral artery stenosis in the neck. Moderate stenosis of the cavernous carotid bilaterally. Mild stenosis left M1 segment Moderate to severe stenosis left P1 segment moderate stenosis right P1 segment.     Assessment and plan:   Ross Morgan is an 82 y.o. male patient who presented with two separate episodes of severe vertigo associated with gait instability and possible slurred speech noted by family, as described above. First episode occurred yesterday, and resolved in about 1 hour; and the second one occurred earlier today and resolved in about 10 minutes. His neurologic examination is grossly nonfocal at this time. Given the significant cerebro vascular risk factors, recommend further neurodiagnostic workup with CT angiogram of the head and neck and CT perfusion study of the brain. Could not obtain MRI due to pacemaker.  CTA studies did not reveal any critical stenosis or occlusions, no perfusion deficits are seen on CT perfusion study.   Patient will be admitted for overnight close neurological monitoring.recommend transthoracic echocardiogram and interrogation of the pacemaker to complete the stroke workup.  He will continue his home medications, including eliquis.   Incidentally, otoscopic examination revealed erythematous tender HSV vesicular lesion in the right ear canal. Possible mild vestibular neuritis secondary to this could in theory cause these episodic vertigo symptoms he has experienced. Will order Valtrex 1 gm BID x 7 days. Will defer steroids for now due to diabetes. If his symptoms recur or worsen, then consider starting Medrol Dosepak with close glucose  monitoring .   Patient admitted to hospitalist service.

## 2015-11-19 NOTE — ED Notes (Signed)
Patient transported to CT 

## 2015-11-19 NOTE — ED Notes (Addendum)
Presents with slurred speech and left sided facial droop and dizziness began at 8 AM, since has resolved, endorses slight headache. Alert, oriented. Hx of same yesterday and it resolved-per family, pt continued to have slight slurred speech and facial dropp throughout the day yesterday. . Denies numbness and tingling.

## 2015-11-20 ENCOUNTER — Observation Stay (HOSPITAL_BASED_OUTPATIENT_CLINIC_OR_DEPARTMENT_OTHER): Payer: Medicare Other

## 2015-11-20 DIAGNOSIS — I6789 Other cerebrovascular disease: Secondary | ICD-10-CM

## 2015-11-20 DIAGNOSIS — I639 Cerebral infarction, unspecified: Secondary | ICD-10-CM

## 2015-11-20 DIAGNOSIS — R001 Bradycardia, unspecified: Secondary | ICD-10-CM | POA: Diagnosis not present

## 2015-11-20 LAB — CBC
HCT: 41.9 % (ref 39.0–52.0)
HEMOGLOBIN: 13.5 g/dL (ref 13.0–17.0)
MCH: 31.9 pg (ref 26.0–34.0)
MCHC: 32.2 g/dL (ref 30.0–36.0)
MCV: 99.1 fL (ref 78.0–100.0)
Platelets: 142 10*3/uL — ABNORMAL LOW (ref 150–400)
RBC: 4.23 MIL/uL (ref 4.22–5.81)
RDW: 12.9 % (ref 11.5–15.5)
WBC: 6.4 10*3/uL (ref 4.0–10.5)

## 2015-11-20 LAB — COMPREHENSIVE METABOLIC PANEL
ALBUMIN: 3.7 g/dL (ref 3.5–5.0)
ALT: 17 U/L (ref 17–63)
ANION GAP: 11 (ref 5–15)
AST: 21 U/L (ref 15–41)
Alkaline Phosphatase: 79 U/L (ref 38–126)
BUN: 15 mg/dL (ref 6–20)
CO2: 23 mmol/L (ref 22–32)
Calcium: 9.1 mg/dL (ref 8.9–10.3)
Chloride: 105 mmol/L (ref 101–111)
Creatinine, Ser: 1.03 mg/dL (ref 0.61–1.24)
GFR calc Af Amer: >60 mL/min (ref >60)
GFR calc non Af Amer: >60 mL/min (ref >60)
Glucose, Bld: 120 mg/dL — ABNORMAL HIGH (ref 65–99)
Potassium: 4.3 mmol/L (ref 3.5–5.1)
SODIUM: 139 mmol/L (ref 135–145)
Total Bilirubin: 1 mg/dL (ref 0.3–1.2)
Total Protein: 6.8 g/dL (ref 6.5–8.1)

## 2015-11-20 LAB — LIPID PANEL
CHOL/HDL RATIO: 3.9 ratio
Cholesterol: 109 mg/dL (ref 0–200)
HDL: 28 mg/dL — ABNORMAL LOW (ref >40)
LDL Cholesterol: 71 mg/dL (ref 0–99)
Triglycerides: 52 mg/dL (ref <150)
VLDL: 10 mg/dL (ref 0–40)

## 2015-11-20 LAB — GLUCOSE, CAPILLARY
GLUCOSE-CAPILLARY: 126 mg/dL — AB (ref 65–99)
GLUCOSE-CAPILLARY: 129 mg/dL — AB (ref 65–99)
Glucose-Capillary: 109 mg/dL — ABNORMAL HIGH (ref 65–99)

## 2015-11-20 MED ORDER — VALACYCLOVIR HCL 1 G PO TABS: 1000.0000 mg | ORAL_TABLET | Freq: Two times a day (BID) | ORAL | Status: DC

## 2015-11-20 NOTE — Evaluation (Signed)
Physical Therapy Evaluation/ Discharge Patient Details Name: Ross Morgan MRN: [id number] DOB: July 08, 1928 Today's Date: 11/20/2015   History of Present Illness  Pt admitted with onset of dizziness and slurred speech with CT demonstrating atrophy. PMHx: HTN, HLD  Clinical Impression  Pt very pleasant, has returned to baseline functional mobility and no further therapy needs at this time. Bil LE strength 5/5 with normal sensation. Pt able to visually track in all directions without use of glasses loss of target x 3 with glasses on maintained target throughout. Pt moving well, no further needs at this time. Will sign off with pt aware and agreeable.     Follow Up Recommendations No PT follow up    Equipment Recommendations  None recommended by PT    Recommendations for Other Services       Precautions / Restrictions Precautions Precautions: None Restrictions Weight Bearing Restrictions: No      Mobility  Bed Mobility Overal bed mobility: Modified Independent                Transfers Overall transfer level: Modified independent                  Ambulation/Gait Ambulation/Gait assistance: Independent Ambulation Distance (Feet): 400 Feet Assistive device: None Gait Pattern/deviations: WFL(Within Functional Limits)   Gait velocity interpretation: at or above normal speed for age/gender    Stairs Stairs: Yes Stairs assistance: Modified independent (Device/Increase time) Stair Management: One rail Right;Alternating pattern;Forwards Number of Stairs: 11    Wheelchair Mobility    Modified Rankin (Stroke Patients Only) Modified Rankin (Stroke Patients Only) Pre-Morbid Rankin Score: No symptoms Modified Rankin: No symptoms     Balance Overall balance assessment: No apparent balance deficits (not formally assessed)                                           Pertinent Vitals/Pain Pain Assessment: No/denies pain    Home Living  Family/patient expects to be discharged to:: Private residence 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 Pt is primary caregiver for wife. 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. pt with periodic LOB or dizziness with extreme head tilts up or down but not consistent. Not reproducible on evaluation today     Hand Dominance        Extremity/Trunk Assessment   Upper Extremity Assessment: Overall WFL for tasks assessed           Lower Extremity Assessment: Overall WFL for tasks assessed      Cervical / Trunk Assessment: Normal  Communication   Communication: No difficulties  Cognition Arousal/Alertness: Awake/alert Behavior During Therapy: WFL for tasks assessed/performed Overall Cognitive Status: Within Functional Limits for tasks assessed                      General Comments General comments (skin integrity, edema, etc.): Pt able to perform head turns, change of speed and direction with gait without assist or LOB    Exercises        Assessment/Plan  PT Assessment Patent does not need any further PT services  PT Diagnosis Other (comment) (Asked to evaluate for pt with dizziness)   PT Problem List    PT Treatment Interventions     PT Goals (Current goals can be found in the Care Plan section) Acute Rehab PT Goals PT Goal Formulation: All assessment and education complete, DC therapy    Frequency     Barriers to discharge        Co-evaluation               End of Session Equipment Utilized During Treatment: Gait belt Activity Tolerance: Patient tolerated treatment well Patient left: in  chair;with call bell/phone within reach;with nursing/sitter in room Nurse Communication: Mobility status    Functional Assessment Tool Used: clinical judgement Functional Limitation: Mobility: Walking and moving around Mobility: Walking and Moving Around Current Status ([id number]): At least 1 percent but less than 20 percent impaired, limited or restricted Mobility: Walking and Moving Around Goal Status [phone]): At least 1 percent but less than 20 percent impaired, limited or restricted Mobility: Walking and Moving Around Discharge Status [phone]): At least 1 percent but less than 20 percent impaired, limited or restricted    Time: 0733-0747 PT Time Calculation (min) (ACUTE ONLY): 14 min   Charges:   PT Evaluation $PT Eval Moderate Complexity: 1 Procedure     PT G Codes:   PT G-Codes **NOT FOR INPATIENT CLASS** Functional Assessment Tool Used: clinical judgement Functional Limitation: Mobility: Walking and moving around Mobility: Walking and Moving Around Current Status ([id number]): At least 1 percent but less than 20 percent impaired, limited or restricted Mobility: Walking and Moving Around Goal Status [phone]): At least 1 percent but less than 20 percent impaired, limited or restricted Mobility: Walking and Moving Around Discharge Status [phone]): At least 1 percent but less than 20 percent impaired, limited or restricted    Delorse Lek 11/20/2015, 7:55 AM Ross Morgan, PT [phone]

## 2015-11-20 NOTE — Discharge Instructions (Signed)
Follow with Primary MD Martha ClanShaw, William, MD in 7 days   Get CBC, CMP, 2 view Chest X ray checked  by Primary MD next visit.    Activity: As tolerated with Full fall precautions use walker/cane & assistance as needed   Disposition Home     Diet:   Heart Healthy Low Carb.  For Heart failure patients - Check your Weight same time everyday, if you gain over 2 pounds, or you develop in leg swelling, experience more shortness of breath or chest pain, call your Primary MD immediately. Follow Cardiac Low Salt Diet and 1.5 lit/day fluid restriction.   On your next visit with your primary care physician please Get Medicines reviewed and adjusted.   Please request your Prim.MD to go over all Hospital Tests and Procedure/Radiological results at the follow up, please get all Hospital records sent to your Prim MD by signing hospital release before you go home.   If you experience worsening of your admission symptoms, develop shortness of breath, life threatening emergency, suicidal or homicidal thoughts you must seek medical attention immediately by calling 911 or calling your MD immediately  if symptoms less severe.  You Must read complete instructions/literature along with all the possible adverse reactions/side effects for all the Medicines you take and that have been prescribed to you. Take any new Medicines after you have completely understood and accpet all the possible adverse reactions/side effects.   Do not drive, operating heavy machinery, perform activities at heights, swimming or participation in water activities or provide baby sitting services if your were admitted for syncope or siezures until you have seen by Primary MD or a Neurologist and advised to do so again.  Do not drive when taking Pain medications.    Do not take more than prescribed Pain, Sleep and Anxiety Medications  Special Instructions: If you have smoked or chewed Tobacco  in the last 2 yrs please stop smoking, stop any  regular Alcohol  and or any Recreational drug use.  Wear Seat belts while driving.   Please note  You were cared for by a hospitalist during your hospital stay. If you have any questions about your discharge medications or the care you received while you were in the hospital after you are discharged, you can call the unit and asked to speak with the hospitalist on call if the hospitalist that took care of you is not available. Once you are discharged, your primary care physician will handle any further medical issues. Please note that NO REFILLS for any discharge medications will be authorized once you are discharged, as it is imperative that you return to your primary care physician (or establish a relationship with a primary care physician if you do not have one) for your aftercare needs so that they can reassess your need for medications and monitor your lab values.

## 2015-11-20 NOTE — Progress Notes (Signed)
  Echocardiogram 2D Echocardiogram has been performed.  Delcie RochENNINGTON, Masin Shatto 11/20/2015, 8:51 AM

## 2015-11-20 NOTE — Evaluation (Signed)
Occupational Therapy Evaluation Patient Details Name: Ross Morgan MRN: [id number] DOB: 09-Sep-1928 Today's Date: 11/20/2015    History of Present Illness Pt admitted with onset of dizziness and slurred speech with CT demonstrating atrophy. PMHx: HTN, HLD   Clinical Impression   Patient admitted with above. Patient independent to mod I PTA. Patient currently functioning at baseline, independent to mod I.  No additional OT needs identified, D/C from acute OT services and no additional follow-up OT needs at this time. All appropriate education provided to patient. Please re-order OT if needed.      Follow Up Recommendations  No OT follow up    Equipment Recommendations  None recommended by OT    Recommendations for Other Services  None at this time   Precautions / Restrictions Precautions Precautions: None Restrictions Weight Bearing Restrictions: No    Mobility Bed Mobility Overal bed mobility: Modified Independent  Transfers Overall transfer level: Modified independent   Balance Overall balance assessment: No apparent balance deficits (not formally assessed)    ADL Overall ADL's : At baseline;Modified independent;Independent General ADL Comments: independent to mod I, at baseline    Vision Vision Assessment?: No apparent visual deficits          Pertinent Vitals/Pain Pain Assessment: No/denies pain   Extremity/Trunk Assessment Upper Extremity Assessment Upper Extremity Assessment: Overall WFL for tasks assessed   Lower Extremity Assessment Lower Extremity Assessment: Overall WFL for tasks assessed   Cervical / Trunk Assessment Cervical / Trunk Assessment: Normal   Communication Communication Communication: No difficulties   Cognition Arousal/Alertness: Awake/alert Behavior During Therapy: WFL for tasks assessed/performed Overall Cognitive Status: Within Functional Limits for tasks assessed              Home Living Family/patient expects to be  discharged to:: Private residence Interior and spatial designer) Living Arrangements: Spouse/significant other Available Help at Discharge: Family Type of Home: House Home Access: Level entry     Home Layout: One level     Bathroom Shower/Tub: Arts development officer Toilet: Handicapped height     Home Equipment: Environmental consultant - 2 wheels   Additional Comments: equipment belongs to wife who has dementia. Pt is primary caregiver for wife. 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 pt with periodic LOB or dizziness with extreme head tilts up or down but not consistent. Not reproducible on evaluation today    OT Diagnosis: Generalized weakness   OT Problem List:   N/a, no acute OT needs identified at this time     OT Treatment/Interventions:   N/a, no acute OT needs identified at this time     OT Goals(Current goals can be found in the care plan section) Acute Rehab OT Goals Patient Stated Goal: go home today OT Goal Formulation: All assessment and education complete, DC therapy  OT Frequency:   N/a, no acute OT needs identified at this time     Barriers to D/C:  None known at this time    End of Session Activity Tolerance: Patient tolerated treatment well Patient left: in bed;with call bell/phone within reach (seated EOB)   Time: 9147-8295 OT Time Calculation (min): 10 min Charges:  OT General Charges $OT Visit: 1 Procedure OT Evaluation $OT Eval Low Complexity: 1 Procedure G-Codes: OT G-codes **NOT FOR INPATIENT CLASS** Functional Limitation: Self care Self Care Current Status (A2130): At least 1 percent but less than  20 percent impaired, limited or restricted (mod I) Self Care Goal Status ([id number]): At least 1 percent but less than 20 percent impaired, limited or restricted (mod I) Self Care Discharge Status [phone]): At least 1 percent but less than 20 percent  impaired, limited or restricted (mod I)  Edwin CapPatricia Jelani Trueba , MS, OTR/L, CLT Pager: [phone]  11/20/2015, 11:09 AM

## 2015-11-20 NOTE — Progress Notes (Signed)
Discharge orders received. Pt educated on discharge instructions and stroke education. Pt verbalized understanding. Pt given discharge packet. IV and tele removed. Pt refused wheelchair. Walked downstairs with family.

## 2015-11-20 NOTE — Progress Notes (Signed)
Patient Demographics:    Ross Morgan, is a 82 y.o. male, DOB - Mar 25, 1928, WJX:[id number]  Admit date - 11/19/2015   Admitting Physician Ozella Rocks, MD  Outpatient Primary MD for the patient is [name], MD  LOS -    Chief Complaint  Patient presents with  . Stroke Symptoms        Subjective:    [name] today has, No headache, No chest pain, No abdominal pain - No Nausea, No new weakness tingling or numbness, No Cough - SOB.   Assessment  & Plan :      1. TIA - MRI cannot be done due to pacemaker, seen by neurology, he is currently on Eliquis that'll be continued, he is also on statin for secondary prevention continued. LDL close to 70, A1c < 6.5. All deficits have resolved, seen by PT OT speech. Cleared by neurology for discharge.  2. Chronic systolic dysfunction. EF 45%. Seen on recent echo, EF used to be 60% one year ago, question if he has underlying ischemic cardiomyopathy, cardiology consulted, he is symptom free, continue Eliquis and statin for secondary prevention.  3. History of sick sinus syndrome. Has pacemaker.  4. Paroxysmal atrial fibrillation. [place] VASC 2 score of greater than 4. Continue on Eliquis.   5. Dyslipidemia. Home dose statin continue.  6. History of depression. On Celexa.  7. History of BPH. Continue on Flomax.  8. DM type II. A1c less than 6.5, on sliding scale continue.  CBG (last 3)   Recent Labs  11/19/15 1339 11/19/15 1957 11/20/15 0647  GLUCAP 98 126* 129*      Code Status : Full  Family Communication  :  none  Disposition Plan  : Home 1-2 days  Consults  :  Neuro  Procedures  :     DVT Prophylaxis  :  Eliquis  Lab Results  Component Value Date   PLT 142* 11/20/2015    Inpatient Medications  Scheduled Meds: .  apixaban  5 mg Oral BID  . atorvastatin  20 mg Oral QHS  . citalopram  10 mg Oral Daily  . insulin aspart  0-5 Units Subcutaneous QHS  . insulin aspart  0-9 Units Subcutaneous TID WC  . sodium chloride  3 mL Intravenous Q12H  . sodium chloride  3 mL Intravenous Q12H  . tamsulosin  0.4 mg Oral QHS  . valACYclovir  1,000 mg Oral BID   Continuous Infusions:  PRN Meds:.sodium chloride, hydrALAZINE, ondansetron **OR** [DISCONTINUED] ondansetron (ZOFRAN) IV, sodium chloride  Antibiotics  :     Anti-infectives    Start     Dose/Rate Route Frequency Ordered Stop   11/20/15 1000  valACYclovir (VALTREX) tablet 1,000 mg     1,000 mg Oral 2 times daily 11/19/15 1941     11/19/15 1700  valACYclovir (VALTREX) tablet 500 mg  Status:  Discontinued     500 mg Oral 2 times daily 11/19/15 1659 11/19/15 1941        Objective:   Filed Vitals:   11/20/15 0355 11/20/15 0553 11/20/15 0855 11/20/15 1015  BP: 142/66 135/78 125/80 128/66  Pulse:   74 69  Temp: 98.4 F (36.9 C) 98.4 F (36.9 C) 98.4 F (36.9 C) 98 F (  36.7 C)  TempSrc: Oral Oral Oral Oral  Resp: 18  18 16   Height:      Weight:      SpO2: 94% 94% 98% 99%    Wt Readings from Last 3 Encounters:  11/19/15 77.338 kg (170 lb 8 oz)  07/12/15 81.557 kg (179 lb 12.8 oz)  11/17/14 81.874 kg (180 lb 8 oz)    No intake or output data in the 24 hours ending 11/20/15 1343   Physical Exam  Awake Alert, Oriented X 3, No new F.N deficits, Normal affect [place].AT,PERRAL Supple Neck,No JVD, No cervical lymphadenopathy appriciated.  Symmetrical Chest wall movement, Good air movement bilaterally, CTAB RRR,No Gallops,Rubs or new Murmurs, No Parasternal Heave +ve B.Sounds, Abd Soft, No tenderness, No organomegaly appriciated, No rebound - guarding or rigidity. No Cyanosis, Clubbing or edema, No new Rash or bruise       Data Review:   Micro Results No results found for this or any previous visit (from the past 240 hour(s)).  Radiology  Reports   Ct Angio Head W/cm &/or Wo Cm  11/19/2015  CLINICAL DATA:  Stroke symptoms. Acute onset slurred speech and left facial droop yesterday morning. EXAM: CT ANGIOGRAPHY HEAD AND NECK TECHNIQUE: Multidetector CT imaging of the head and neck was performed using the standard protocol during bolus administration of intravenous contrast. Multiplanar CT image reconstructions and MIPs were obtained to evaluate the vascular anatomy. Carotid stenosis measurements (when applicable) are obtained utilizing NASCET criteria, using the distal internal carotid diameter as the denominator. CONTRAST:  100mL OMNIPAQUE IOHEXOL 350 MG/ML SOLN COMPARISON:  CT head 11/19/2015 FINDINGS: CTA NECK Aortic arch: Aortic arch demonstrates normal caliber and minimal atherosclerotic disease. Proximal great vessels widely patent. No dissection identified. Lung apices are clear. No mediastinal mass. Right carotid system: Right common carotid artery widely patent. Atherosclerotic plaque in the proximal right internal carotid artery. There is a prominent ulceration in the carotid bulb measuring 3 x 9 mm. This could be a source of emboli. 30% diameter stenosis right internal carotid artery. Right external carotid artery widely patent. No dissection. Left carotid system: Left common carotid artery widely patent. Mild atherosclerotic calcification at the carotid bifurcation without significant stenosis or dissection. 30% diameter stenosis left internal carotid artery Vertebral arteries:Both vertebral arteries are patent to the basilar without significant stenosis or dissection. Skeleton: Mild cervical spine degenerative change. No fracture or bone lesion. Other neck: Negative for adenopathy in the neck. CTA HEAD Anterior circulation: Atherosclerotic calcification in the carotid siphon bilaterally with moderate stenosis of the distal cavernous carotid bilaterally. No cavernous carotid aneurysm. Anterior and middle cerebral arteries are patent  bilaterally. Mild stenosis left M1 segment. Posterior circulation: Both vertebral arteries patent to the basilar. PICA patent. Basilar widely patent. Left AICA patent. Superior cerebellar arteries are patent bilaterally with moderate atherosclerotic disease. Atherosclerotic irregularity involving the proximal posterior cerebral artery bilaterally left greater than right. Moderate to severe stenosis on the left and moderate stenosis on the right. Venous sinuses: Patent Anatomic variants: Negative for cerebral aneurysm or vascular malformation. Delayed phase: Normal enhancement on delayed imaging. No enhancing mass lesion. IMPRESSION: Atrophy and chronic microvascular ischemia. No acute intracranial abnormality. Prominent ulceration of the right carotid bulb which may be a source of emboli. No significant stenosis of the carotid or vertebral artery stenosis in the neck. Moderate stenosis of the cavernous carotid bilaterally. Mild stenosis left M1 segment Moderate to severe stenosis left P1 segment moderate stenosis right P1 segment Critical Value/emergent results were called  by telephone at the time of interpretation on 11/19/2015 at 7:24 pm to Dr. Leroy Kennedy , who verbally acknowledged these results. Electronically Signed   By: Marlan Palau M.D.   On: 11/19/2015 19:24   Ct Head Wo Contrast  11/19/2015  CLINICAL DATA:  Slurred speech.  Facial droop. EXAM: CT HEAD WITHOUT CONTRAST TECHNIQUE: Contiguous axial images were obtained from the base of the skull through the vertex without intravenous contrast. COMPARISON:  07/08/2008 FINDINGS: There is low attenuation throughout the subcortical and periventricular white matter compatible with chronic microvascular disease. Prominence of the sulci and ventricles noted consistent with brain atrophy. The paranasal sinuses are clear. The mastoid air cells are clear. The calvarium is intact. IMPRESSION: 1. No acute intracranial abnormalities. 2. Chronic micro vascular disease and  brain atrophy. Electronically Signed   By: Signa Kell M.D.   On: 11/19/2015 14:19   Ct Angio Neck W/cm &/or Wo/cm  11/19/2015  CLINICAL DATA:  Stroke symptoms. Acute onset slurred speech and left facial droop yesterday morning. EXAM: CT ANGIOGRAPHY HEAD AND NECK TECHNIQUE: Multidetector CT imaging of the head and neck was performed using the standard protocol during bolus administration of intravenous contrast. Multiplanar CT image reconstructions and MIPs were obtained to evaluate the vascular anatomy. Carotid stenosis measurements (when applicable) are obtained utilizing NASCET criteria, using the distal internal carotid diameter as the denominator. CONTRAST:  OMNIPAQUE IOHEXOL 350 MG/ML SOLN COMPARISON:  CT head 11/19/2015 FINDINGS: CTA NECK Aortic arch: Aortic arch demonstrates normal caliber and minimal atherosclerotic disease. Proximal great vessels widely patent. No dissection identified. Lung apices are clear. No mediastinal mass. Right carotid system: Right common carotid artery widely patent. Atherosclerotic plaque in the proximal right internal carotid artery. There is a prominent ulceration in the carotid bulb measuring 3 x 9 mm. This could be a source of emboli. 30% diameter stenosis right internal carotid artery. Right external carotid artery widely patent. No dissection. Left carotid system: Left common carotid artery widely patent. Mild atherosclerotic calcification at the carotid bifurcation without significant stenosis or dissection. 30% diameter stenosis left internal carotid artery Vertebral arteries:Both vertebral arteries are patent to the basilar without significant stenosis or dissection. Skeleton: Mild cervical spine degenerative change. No fracture or bone lesion. Other neck: Negative for adenopathy in the neck. CTA HEAD Anterior circulation: Atherosclerotic calcification in the carotid siphon bilaterally with moderate stenosis of the distal cavernous carotid bilaterally. No  cavernous carotid aneurysm. Anterior and middle cerebral arteries are patent bilaterally. Mild stenosis left M1 segment. Posterior circulation: Both vertebral arteries patent to the basilar. PICA patent. Basilar widely patent. Left AICA patent. Superior cerebellar arteries are patent bilaterally with moderate atherosclerotic disease. Atherosclerotic irregularity involving the proximal posterior cerebral artery bilaterally left greater than right. Moderate to severe stenosis on the left and moderate stenosis on the right. Venous sinuses: Patent Anatomic variants: Negative for cerebral aneurysm or vascular malformation. Delayed phase: Normal enhancement on delayed imaging. No enhancing mass lesion. IMPRESSION: Atrophy and chronic microvascular ischemia. No acute intracranial abnormality. Prominent ulceration of the right carotid bulb which may be a source of emboli. No significant stenosis of the carotid or vertebral artery stenosis in the neck. Moderate stenosis of the cavernous carotid bilaterally. Mild stenosis left M1 segment Moderate to severe stenosis left P1 segment moderate stenosis right P1 segment Critical Value/emergent results were called by telephone at the time of interpretation on 11/19/2015 at 7:24 pm to Dr. Leroy Kennedy , who verbally acknowledged these results. Electronically Signed   By: Leonette Most  Chestine Spore M.D.   On: 11/19/2015 19:24   Ct Cerebral Perfusion W/cm  11/19/2015  CLINICAL DATA:  Stroke symptoms. Slurred speech and left facial droop began yesterday. EXAM: CT CEREBRAL PERFUSION WITH CONTRAST TECHNIQUE: CT perfusion performed through the basal ganglia and mid cerebral hemispheres with calculation of cerebral blood volume, mean transit time, and time to peak perfusion, and cerebral blood flow. CONTRAST:  OMNIPAQUE IOHEXOL 350 MG/ML SOLN COMPARISON:  CT head 11/19/2015 FINDINGS: Negative for acute infarct. Cerebral blood volume is symmetric without evidence of core infarct. There is a subtle  finding of increased mean transit time in the right basal ganglia. This also shows slight increase in time to peak perfusion and is suggestive of mild ischemia. IMPRESSION: Negative for acute infarct. Subtle finding of increased mean transit time right basal ganglia suggestive of ischemia. Correlate with MRI. Critical Value/emergent results were called by telephone at the time of interpretation on 11/19/2015 at 7:25 pm to Dr. Leroy Kennedy , who verbally acknowledged these results. Electronically Signed   By: Marlan Palau M.D.   On: 11/19/2015 19:25     CBC  Recent Labs Lab 11/19/15 1336 11/19/15 1345 11/20/15 0632  WBC 5.5  --  6.4  HGB 12.8* 14.3 13.5  HCT 39.7 42.0 41.9  PLT 138*  --  142*  MCV 99.5  --  99.1  MCH 32.1  --  31.9  MCHC 32.2  --  32.2  RDW 12.9  --  12.9  LYMPHSABS 1.6  --   --   MONOABS 0.5  --   --   EOSABS 0.1  --   --   BASOSABS 0.0  --   --     Chemistries   Recent Labs Lab 11/19/15 1336 11/19/15 1345 11/20/15 0632  NA 139 139 139  K 4.8 4.8 4.3  CL 106 104 105  CO2 26  --  23  GLUCOSE 110* 107* 120*  BUN 20 26* 15  CREATININE 1.27* 1.10 1.03  CALCIUM 9.4  --  9.1  AST 21  --  21  ALT 16*  --  17  ALKPHOS 81  --  79  BILITOT 0.7  --  1.0   ------------------------------------------------------------------------------------------------------------------ estimated creatinine clearance is 55.8 mL/min (by C-G formula based on Cr of 1.03). ------------------------------------------------------------------------------------------------------------------ No results for input(s): HGBA1C in the last 72 hours. ------------------------------------------------------------------------------------------------------------------  Recent Labs  11/20/15 0632  CHOL 109  HDL 28*  LDLCALC 71  TRIG 52  CHOLHDL 3.9   ------------------------------------------------------------------------------------------------------------------ No results for input(s): TSH,  T4TOTAL, T3FREE, THYROIDAB in the last 72 hours.  Invalid input(s): FREET3 ------------------------------------------------------------------------------------------------------------------ No results for input(s): VITAMINB12, FOLATE, FERRITIN, TIBC, IRON, RETICCTPCT in the last 72 hours.  Coagulation profile  Recent Labs Lab 11/19/15 1336  INR 1.25   Lab Results  Component Value Date   HGBA1C 6.4* 05/30/2014    Lab Results  Component Value Date   CHOL 109 11/20/2015   HDL 28* 11/20/2015   LDLCALC 71 11/20/2015   TRIG 52 11/20/2015   CHOLHDL 3.9 11/20/2015    No results for input(s): DDIMER in the last 72 hours.  Cardiac Enzymes No results for input(s): CKMB, TROPONINI, MYOGLOBIN in the last 168 hours.  Invalid input(s): CK ------------------------------------------------------------------------------------------------------------------ Invalid input(s): POCBNP   Time Spent in minutes  35   Nicholle Falzon K M.D on 11/20/2015 at 1:43 PM  Between 7am to 7pm - Pager - [phone]  After 7pm go to [email or web address] - password [email or web address]  [facility] -  Office  [phone]      

## 2015-11-20 NOTE — Progress Notes (Addendum)
STROKE TEAM PROGRESS NOTE   HISTORY Ross Morgan is an 82 y.o. male patient who presented to the ER following 2 episodes of acute vertigo. He had the first episode yesterday after lunch when he tried to stand appear intense vertigo symptoms with gait instability. He sat down and eventually after about an hour he started feeling better. He was noted to have some speech trouble with mild slurred speech noted by his family around that time. No definite evidence of any facial droop, vision symptoms or any focal sensorimotor the medicine the limbs at that time. He continued to do well last night that the symptoms. Today afternoon he had recurrence of similar episode of intense vertigo, gait instability and possibly some speech difficulty noted by family. With this episode was short lasting, resolved in about 10 minutes or so. Due to recurrence of these episodes, he was brought into the ER for further evaluation. At this time he denies any significant vertigo symptoms, no vision speech problems or sensorimotor symptoms in the limbs. He was last known well 11/18/2015, time prior to lunch time, patient unable to specify the time precisely. tPA was not given as outside the time window, symptoms resolved. NIHSS 0.   Stroke Risk Factors - atrial fibrillation, diabetes mellitus, hyperlipidemia and hypertension   SUBJECTIVE (INTERVAL HISTORY) No family is at the bedside.  Overall he feels his condition is stable. He is a retired Education officer, communitydentist, a Psychiatristwoodworker. He is anxious to go home.    OBJECTIVE Temp:  [98.3 F (36.8 C)-99 F (37.2 C)] 98.4 F (36.9 C) (01/09 0855) Pulse Rate:  [67-81] 74 (01/09 0855) Cardiac Rhythm:  [-] Ventricular paced;Bundle branch block (01/09 0700) Resp:  [12-21] 18 (01/09 0855) BP: (119-167)/(62-88) 125/80 mmHg (01/09 0855) SpO2:  [93 %-99 %] 98 % (01/09 0855) Weight:  [77.338 kg (170 lb 8 oz)] 77.338 kg (170 lb 8 oz) (01/08 2000)  CBC:  Recent Labs Lab 11/19/15 1336 11/19/15 1345  11/20/15 0632  WBC 5.5  --  6.4  NEUTROABS 3.3  --   --   HGB 12.8* 14.3 13.5  HCT 39.7 42.0 41.9  MCV 99.5  --  99.1  PLT 138*  --  142*    Basic Metabolic Panel:  Recent Labs Lab 11/19/15 1336 11/19/15 1345 11/20/15 0632  NA 139 139 139  K 4.8 4.8 4.3  CL 106 104 105  CO2 26  --  23  GLUCOSE 110* 107* 120*  BUN 20 26* 15  CREATININE 1.27* 1.10 1.03  CALCIUM 9.4  --  9.1    Lipid Panel:    Component Value Date/Time   CHOL 109 11/20/2015 0632   TRIG 52 11/20/2015 0632   HDL 28* 11/20/2015 0632   CHOLHDL 3.9 11/20/2015 0632   VLDL 10 11/20/2015 0632   LDLCALC 71 11/20/2015 0632   HgbA1c:  Lab Results  Component Value Date   HGBA1C 6.4* 05/30/2014   Urine Drug Screen: No results found for: LABOPIA, COCAINSCRNUR, LABBENZ, AMPHETMU, THCU, LABBARB    IMAGING  Ct Head Wo Contrast 11/19/2015   1. No acute intracranial abnormalities. 2. Chronic micro vascular disease and brain atrophy.   Ct Angio Head & Neck W/cm &/or Wo/cm 11/19/2015  Atrophy and chronic microvascular ischemia. No acute intracranial abnormality. Prominent ulceration of the right carotid bulb which may be a source of emboli. No significant stenosis of the carotid or vertebral artery stenosis in the neck. Moderate stenosis of the cavernous carotid bilaterally. Mild stenosis left M1 segment  Moderate to severe stenosis left P1 segment moderate stenosis right P1 segment   Ct Cerebral Perfusion W/cm 11/19/2015  Negative for acute infarct. Subtle finding of increased mean transit time right basal ganglia suggestive of ischemia.   2D Echocardiogram  - Left ventricle: The cavity size was mildly dilated. Wall thickness was normal. Systolic function was mildly reduced. The estimated ejection fraction was in the range of 45% to 50%. - Aortic valve: There was mild regurgitation. - Left atrium: The atrium was mildly dilated. - Right ventricle: The cavity size was mildly dilated. Systolic function was mildly reduced. -  Right atrium: The atrium was moderately dilated. - Pericardium, extracardiac: A trivial pericardial effusion was identified.   PHYSICAL EXAM Pleasant elderly Caucasian male not in distress. . Afebrile. Head is nontraumatic. Neck is supple without bruit.    Cardiac exam no murmur or gallop. Lungs are clear to auscultation. Distal pulses are well felt. Neurological Exam ;  Awake  Alert oriented x 3. Normal speech and language.eye movements full without nystagmus.fundi were not visualized. Vision acuity and fields appear normal. Hearing is normal. Palatal movements are normal. Face symmetric. Tongue midline. Normal strength, tone, reflexes and coordination. Normal sensation. Gait deferred. ASSESSMENT/PLAN [name] is a 82 y.o. male with history of AF on anticoagulation, HTN, DM, HLD presenting with 2 episodes of vertigo. He did not receive IV t-PA due to outside the window, symptoms resolved, NIHSS 0.   TIA, most likely embolic secondary to known AF  Resultant  Neuro symptoms resolved  MRI  / MRA  pacer  CTA head and neck no acute abnormality  2D Echo  EF 45-50% (down from previous)  LDL 71  HgbA1c pending  eliquis for VTE prophylaxis  Diet heart healthy/carb modified Room service appropriate?: Yes; Fluid consistency:: Thin  Eliquis (apixaban) daily prior to admission, now on Eliquis (apixaban) daily  Patient counseled to be compliant with his antithrombotic medications  Ongoing aggressive stroke risk factor management  Therapy recommendations:  No therapy needs  Disposition:  Return home  Follow up Darrol Angel, NP at [facility] in 2 months. Order written.  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 Per Pearlean Brownie, no indication to continue this medication at discharge. Pt discussed with RN who stated he did not plan to take. I discussed with Dr. Thedore Mins and RN.  Hospital day #   Rhoderick Moody [facility] See Amion for Pager information 11/20/2015 2:55 PM  I have personally examined this patient, reviewed notes, independently viewed imaging studies, participated in medical decision making and plan of care. I have made any additions or clarifications directly to the above note. Agree with note above. He presented with brief episodes of vertigo likely posterior dislocation TIAs despite being on anticoagulation. He remains at risk for neurological worsening, recurrent stroke, TIA needs ongoing stroke evaluation and aggressive risk factor modification. I had a brief discussion with the patient with regards to secondary stroke prevention benefit of anticoagulation in patients with atrial fibrillation at  aspirin 60-70%. There is no current data suggesting switching from one NOAC to another is necessary superior hence recommend he continue eliquis in the current dose.  Delia Heady, MD Medical Director [facility] Pager: [phone] 11/20/2015 3:58 PM    To contact Stroke Continuity provider, please refer to [email or web address]. After hours, contact General Neurology

## 2015-11-20 NOTE — Discharge Summary (Signed)
Ross Morgan, is a 82 y.o. male  DOB Mar 30, 1928  MRN [id number].  Admission date:  11/19/2015  Admitting Physician  Waldemar Dickens, MD  Discharge Date:  11/20/2015   Primary MD  Marton Redwood, MD  Recommendations for primary care physician for things to follow:   Needs outpatient cardiology follow-up within a week for depressed EF which was 60% one year ago and now is 45%.  Monitor secondaries factors for stroke   Admission Diagnosis  Stroke-like symptoms [R29.90] Cerebrovascular accident (CVA), unspecified mechanism ([place]) [I63.9]   Discharge Diagnosis  Stroke-like symptoms [R29.90] Cerebrovascular accident (CVA), unspecified mechanism ([place]) [I63.9]     Active Problems:   Essential hypertension   Hyperlipidemia   Bradycardia, sinus   Type II diabetes mellitus ([place])   Pacemaker   Stroke (cerebrum) (HCC)   Paroxysmal a-fib (HCC)   BPH (benign prostatic hyperplasia)      Past Medical History  Diagnosis Date  . Hypertension   . Hyperlipidemia   . Anal fissure   . Knee pain 2015    "tore tendon left knee; no OR"  . Bradycardia   . Atrial fibrillation ([place])   . Type II diabetes mellitus ([place])   . Arthritis     "left pointer" (05/30/2014)  . Depression   . Symptomatic bradycardia     status post dual AV permanent transvenous pacemaker insertion  . On continuous oral anticoagulation     Past Surgical History  Procedure Laterality Date  . Cataract extraction w/ intraocular lens  implant, bilateral Bilateral 2000's  . Pacemaker insertion  05/31/14    STJ dual chamber pacemaker implanted by Dr Lovena Le for symptomatic bradycardia  . Permanent pacemaker insertion N/A 05/31/2014    Procedure: PERMANENT PACEMAKER INSERTION;  Surgeon: Evans Lance, MD;  Location: [facility] CATH LAB;  Service: Cardiovascular;   Laterality: N/A;       HPI  from the history and physical done on the day of admission:    Patient family reports acute onset slurred speech and left facial droop which began yesterday morning while patient was reaching into his closet. This was associated with a brief episode of dizziness. Over the course the day patient's symptoms began to resolve. Patient awoke this morning when symptoms again came on acutely. At that point patient presented to [facility] emergency department. Patient denies any vision change, any dizziness with head movement, LOC, nausea, vomiting, chest pain, palpitations. Patient denies any falls or head trauma. Currently has headache. Patient reports compliance with his L question has a pacemaker in place. Denies any history of A. fib though it is listed in his chart back several years. Nothing makes his symptoms better or worse. Symptoms are constant but improving. Denies any recent URI or viral infections.     Hospital Course:     1. TIA - MRI cannot be done due to pacemaker, seen by neurology, he is currently on Eliquis that'll be continued, he is also on statin for secondary prevention continued. LDL close to 70, A1c <  6.5. All deficits have resolved, seen by PT OT speech. Cleared by neurology for discharge.  2. Chronic systolic dysfunction. EF 45%. 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 He promises me that he is symptom-free from cardiac standpoint and he will see his cardiologist within a week Dr. Donnella Bi, continue Eliquis and statin for secondary prevention.  3. History of sick sinus syndrome. Has  pacemaker.  4. Paroxysmal atrial fibrillation. [place] VASC 2 score of greater than 4. Continue on Eliquis.   5. Dyslipidemia. Home dose statin continue.  6. History of depression. On Celexa.  7. History of BPH. Continue on Flomax.  8. DM type II. A1c less than 6.5, continue home regimen.       Discharge Condition: Stable  Follow UP  Follow-up Information    Follow up with Marton Redwood, MD. Schedule an appointment as soon as possible for a visit in 1 week.   Specialty:  Internal Medicine   Contact information:   [address] [place] [address] [phone]       Follow up with Quay Burow, MD. Schedule an appointment as soon as possible for a visit in 1 week.   Specialties:  Cardiology, Radiology   Contact information:   [address] [place] [address] [phone]        Consults obtained - Neuro, Cards  Diet and Activity recommendation: See Discharge Instructions below  Discharge Instructions       Discharge Instructions    Discharge instructions    Complete by:  As directed   Follow with Primary MD Marton Redwood, MD in 7 days   Get CBC, CMP, 2 view Chest X ray checked  by Primary MD next visit.    Activity: As tolerated with Full fall precautions use walker/cane & assistance as needed   Disposition Home     Diet:   Heart Healthy Low Carb.  For Heart failure patients - Check your Weight same time everyday, if you gain over 2 pounds, or you develop in leg swelling, experience more shortness of breath or chest pain, call your Primary MD immediately. Follow Cardiac Low Salt Diet and 1.5 lit/day fluid restriction.   On your next visit with your primary care physician please Get Medicines reviewed and adjusted.   Please request your Prim.MD to go over all Hospital Tests and Procedure/Radiological results at the follow up, please get all Hospital records sent to your Prim MD by signing hospital release before you go  home.   If you experience worsening of your admission symptoms, develop shortness of breath, life threatening emergency, suicidal or homicidal thoughts you must seek medical attention immediately by calling 911 or calling your MD immediately  if symptoms less severe.  You Must read complete instructions/literature along with all the possible adverse reactions/side effects for all the Medicines you take and that have been prescribed to you. Take any new Medicines after you have completely understood and accpet all the possible adverse reactions/side effects.   Do not drive, operating heavy machinery, perform activities at heights, swimming or participation in water activities or provide baby sitting services if your were  admitted for syncope or siezures until you have seen by Primary MD or a Neurologist and advised to do so again.  Do not drive when taking Pain medications.    Do not take more than prescribed Pain, Sleep and Anxiety Medications  Special Instructions: If you have smoked or chewed Tobacco  in the last 2 yrs please stop smoking, stop any regular Alcohol  and or any Recreational drug use.  Wear Seat belts while driving.   Please note  You were cared for by a hospitalist during your hospital stay. If you have any questions about your discharge medications or the care you received while you were in the hospital after you are discharged, you can call the unit and asked to speak with the hospitalist on call if the hospitalist that took care of you is not available. Once you are discharged, your primary care physician will handle any further medical issues. Please note that NO REFILLS for any discharge medications will be authorized once you are discharged, as it is imperative that you return to your primary care physician (or establish a relationship with a primary care physician if you do not have one) for your aftercare needs so that they can reassess your need for medications and  monitor your lab values.     Increase activity slowly    Complete by:  As directed              Discharge Medications       Medication List    TAKE these medications        alfuzosin 10 MG 24 hr tablet  Commonly known as:  UROXATRAL  Take 10 mg by mouth every evening.     ALKA-SELTZER HEARTBURN + GAS 750-80 MG Chew  Generic drug:  Calcium Carbonate-Simethicone  Chew 1-2 each by mouth at bedtime.     amLODipine 10 MG tablet  Commonly known as:  NORVASC  Take 10 mg by mouth daily.     ANAMANTLE HC 3-0.5 % Kit  Generic drug:  Lidocaine-Hydrocortisone Ace  Place 1 application rectally daily as needed (for irritation).     apixaban 5 MG Tabs tablet  Commonly known as:  ELIQUIS  Take 1 tablet (5 mg total) by mouth 2 (two) times daily.     atorvastatin 20 MG tablet  Commonly known as:  LIPITOR  Take 20 mg by mouth at bedtime.     BALNEOL Lotn  Apply 1 application topically daily as needed (for irritation).     citalopram 10 MG tablet  Commonly known as:  CELEXA  Take 10 mg by mouth at bedtime.     Co Q-10 200 MG Caps  Take 1 capsule by mouth daily.     fluorouracil 5 % cream  Commonly known as:  EFUDEX  Apply 1 application topically daily as needed (for irritation).     lidocaine 5 % ointment  Commonly known as:  XYLOCAINE  Apply 1 application topically as needed.     multivitamin capsule  Take 1 capsule by mouth daily.     NON FORMULARY  Place 1 drop into the left eye at bedtime. CVS lubricant eye drops left eye only     NON FORMULARY  Place 1 application into the right eye at bedtime. Mura 5% eye ointment in right eye only     pimecrolimus 1 % cream  Commonly known as:  ELIDEL  Apply 1 application topically daily as needed (for face).     polyethylene  glycol packet  Commonly known as:  MIRALAX / GLYCOLAX  Take 17 g by mouth every morning.     sitaGLIPtin-metformin 50-1000 MG tablet  Commonly known as:  JANUMET  Take 1 tablet by mouth 2 (two)  times daily with a meal.     valACYclovir 1000 MG tablet  Commonly known as:  VALTREX  Take 1 tablet (1,000 mg total) by mouth 2 (two) times daily.     valsartan 160 MG tablet  Commonly known as:  DIOVAN  Take 160 mg by mouth at bedtime.     vitamin C 500 MG tablet  Commonly known as:  ASCORBIC ACID  Take 500 mg by mouth daily.     zinc oxide 11.3 % Crea cream  Commonly known as:  BALMEX  Apply 1 application topically daily as needed (for rash).        Major procedures and Radiology Reports - PLEASE review detailed and final reports for all details, in brief -    TTE  Left ventricle: The cavity size was mildly dilated. Wall thickness was normal. Systolic function was mildly reduced. The estimated ejection fraction was in the range of 45% to 50%. - Aortic valve: There was mild regurgitation. - Left atrium: The atrium was mildly dilated. - Right ventricle: The cavity size was mildly dilated. Systolic function was mildly reduced. - Right atrium: The atrium was moderately dilated. - Pericardium, extracardiac: A trivial pericardial effusion was identified.   Ct Angio Head W/cm &/or Wo Cm  11/19/2015  CLINICAL DATA:  Stroke symptoms. Acute onset slurred speech and left facial droop yesterday morning. EXAM: CT ANGIOGRAPHY HEAD AND NECK TECHNIQUE: Multidetector CT imaging of the head and neck was performed using the standard protocol during bolus administration of intravenous contrast. Multiplanar CT image reconstructions and MIPs were obtained to evaluate the vascular anatomy. Carotid stenosis measurements (when applicable) are obtained utilizing NASCET criteria, using the distal internal carotid diameter as the denominator. CONTRAST:  115m OMNIPAQUE IOHEXOL 350 MG/ML SOLN COMPARISON:  CT head 11/19/2015 FINDINGS: CTA NECK Aortic arch: Aortic arch demonstrates normal caliber and minimal atherosclerotic disease. Proximal great vessels widely patent. No dissection identified.  Lung apices are clear. No mediastinal mass. Right carotid system: Right common carotid artery widely patent. Atherosclerotic plaque in the proximal right internal carotid artery. There is a prominent ulceration in the carotid bulb measuring 3 x 9 mm. This could be a source of emboli. 30% diameter stenosis right internal carotid artery. Right external carotid artery widely patent. No dissection. Left carotid system: Left common carotid artery widely patent. Mild atherosclerotic calcification at the carotid bifurcation without significant stenosis or dissection. 30% diameter stenosis left internal carotid artery Vertebral arteries:Both vertebral arteries are patent to the basilar without significant stenosis or dissection. Skeleton: Mild cervical spine degenerative change. No fracture or bone lesion. Other neck: Negative for adenopathy in the neck. CTA HEAD Anterior circulation: Atherosclerotic calcification in the carotid siphon bilaterally with moderate stenosis of the distal cavernous carotid bilaterally. No cavernous carotid aneurysm. Anterior and middle cerebral arteries are patent bilaterally. Mild stenosis left M1 segment. Posterior circulation: Both vertebral arteries patent to the basilar. PICA patent. Basilar widely patent. Left AICA patent. Superior cerebellar arteries are patent bilaterally with moderate atherosclerotic disease. Atherosclerotic irregularity involving the proximal posterior cerebral artery bilaterally left greater than right. Moderate to severe stenosis on the left and moderate stenosis on the right. Venous sinuses: Patent Anatomic variants: Negative for cerebral aneurysm or vascular malformation. Delayed phase: Normal enhancement on  delayed imaging. No enhancing mass lesion. IMPRESSION: Atrophy and chronic microvascular ischemia. No acute intracranial abnormality. Prominent ulceration of the right carotid bulb which may be a source of emboli. No significant stenosis of the carotid or  vertebral artery stenosis in the neck. Moderate stenosis of the cavernous carotid bilaterally. Mild stenosis left M1 segment Moderate to severe stenosis left P1 segment moderate stenosis right P1 segment Critical Value/emergent results were called by telephone at the time of interpretation on 11/19/2015 at 7:24 pm to Dr. Armida Sans , who verbally acknowledged these results. Electronically Signed   By: Franchot Gallo M.D.   On: 11/19/2015 19:24   Ct Head Wo Contrast  11/19/2015  CLINICAL DATA:  Slurred speech.  Facial droop. EXAM: CT HEAD WITHOUT CONTRAST TECHNIQUE: Contiguous axial images were obtained from the base of the skull through the vertex without intravenous contrast. COMPARISON:  07/08/2008 FINDINGS: There is low attenuation throughout the subcortical and periventricular white matter compatible with chronic microvascular disease. Prominence of the sulci and ventricles noted consistent with brain atrophy. The paranasal sinuses are clear. The mastoid air cells are clear. The calvarium is intact. IMPRESSION: 1. No acute intracranial abnormalities. 2. Chronic micro vascular disease and brain atrophy. Electronically Signed   By: Kerby Moors M.D.   On: 11/19/2015 14:19   Ct Angio Neck W/cm &/or Wo/cm  11/19/2015  CLINICAL DATA:  Stroke symptoms. Acute onset slurred speech and left facial droop yesterday morning. EXAM: CT ANGIOGRAPHY HEAD AND NECK TECHNIQUE: Multidetector CT imaging of the head and neck was performed using the standard protocol during bolus administration of intravenous contrast. Multiplanar CT image reconstructions and MIPs were obtained to evaluate the vascular anatomy. Carotid stenosis measurements (when applicable) are obtained utilizing NASCET criteria, using the distal internal carotid diameter as the denominator. CONTRAST:  171m OMNIPAQUE IOHEXOL 350 MG/ML SOLN COMPARISON:  CT head 11/19/2015 FINDINGS: CTA NECK Aortic arch: Aortic arch demonstrates normal caliber and minimal  atherosclerotic disease. Proximal great vessels widely patent. No dissection identified. Lung apices are clear. No mediastinal mass. Right carotid system: Right common carotid artery widely patent. Atherosclerotic plaque in the proximal right internal carotid artery. There is a prominent ulceration in the carotid bulb measuring 3 x 9 mm. This could be a source of emboli. 30% diameter stenosis right internal carotid artery. Right external carotid artery widely patent. No dissection. Left carotid system: Left common carotid artery widely patent. Mild atherosclerotic calcification at the carotid bifurcation without significant stenosis or dissection. 30% diameter stenosis left internal carotid artery Vertebral arteries:Both vertebral arteries are patent to the basilar without significant stenosis or dissection. Skeleton: Mild cervical spine degenerative change. No fracture or bone lesion. Other neck: Negative for adenopathy in the neck. CTA HEAD Anterior circulation: Atherosclerotic calcification in the carotid siphon bilaterally with moderate stenosis of the distal cavernous carotid bilaterally. No cavernous carotid aneurysm. Anterior and middle cerebral arteries are patent bilaterally. Mild stenosis left M1 segment. Posterior circulation: Both vertebral arteries patent to the basilar. PICA patent. Basilar widely patent. Left AICA patent. Superior cerebellar arteries are patent bilaterally with moderate atherosclerotic disease. Atherosclerotic irregularity involving the proximal posterior cerebral artery bilaterally left greater than right. Moderate to severe stenosis on the left and moderate stenosis on the right. Venous sinuses: Patent Anatomic variants: Negative for cerebral aneurysm or vascular malformation. Delayed phase: Normal enhancement on delayed imaging. No enhancing mass lesion. IMPRESSION: Atrophy and chronic microvascular ischemia. No acute intracranial abnormality. Prominent ulceration of the right  carotid bulb which may be a  source of emboli. No significant stenosis of the carotid or vertebral artery stenosis in the neck. Moderate stenosis of the cavernous carotid bilaterally. Mild stenosis left M1 segment Moderate to severe stenosis left P1 segment moderate stenosis right P1 segment Critical Value/emergent results were called by telephone at the time of interpretation on 11/19/2015 at 7:24 pm to Dr. Armida Sans , who verbally acknowledged these results. Electronically Signed   By: Franchot Gallo M.D.   On: 11/19/2015 19:24   Ct Cerebral Perfusion W/cm  11/19/2015  CLINICAL DATA:  Stroke symptoms. Slurred speech and left facial droop began yesterday. EXAM: CT CEREBRAL PERFUSION WITH CONTRAST TECHNIQUE: CT perfusion performed through the basal ganglia and mid cerebral hemispheres with calculation of cerebral blood volume, mean transit time, and time to peak perfusion, and cerebral blood flow. CONTRAST:  124m OMNIPAQUE IOHEXOL 350 MG/ML SOLN COMPARISON:  CT head 11/19/2015 FINDINGS: Negative for acute infarct. Cerebral blood volume is symmetric without evidence of core infarct. There is a subtle finding of increased mean transit time in the right basal ganglia. This also shows slight increase in time to peak perfusion and is suggestive of mild ischemia. IMPRESSION: Negative for acute infarct. Subtle finding of increased mean transit time right basal ganglia suggestive of ischemia. Correlate with MRI. Critical Value/emergent results were called by telephone at the time of interpretation on 11/19/2015 at 7:25 pm to Dr. CArmida Sans, who verbally acknowledged these results. Electronically Signed   By: CFranchot GalloM.D.   On: 11/19/2015 19:25    Micro Results     No results found for this or any previous visit (from the past 240 hour(s)).  Today   Subjective    LFrederico Gerlingtoday has no headache,no chest abdominal pain,no new weakness tingling or numbness, feels much better wants to go home today.     Objective   Blood pressure 128/66, pulse 69, temperature 98 F (36.7 C), temperature source Oral, resp. rate 16, height 5' 11" (1.803 m), weight 77.338 kg (170 lb 8 oz), SpO2 99 %.  No intake or output data in the 24 hours ending 11/20/15 1416  Exam Awake Alert, Oriented x 3, No new F.N deficits, Normal affect [place].AT,PERRAL Supple Neck,No JVD, No cervical lymphadenopathy appriciated.  Symmetrical Chest wall movement, Good air movement bilaterally, CTAB RRR,No Gallops,Rubs or new Murmurs, No Parasternal Heave +ve B.Sounds, Abd Soft, Non tender, No organomegaly appriciated, No rebound -guarding or rigidity. No Cyanosis, Clubbing or edema, No new Rash or bruise   Data Review   CBC w Diff:  Lab Results  Component Value Date   WBC 6.4 11/20/2015   HGB 13.5 11/20/2015   HCT 41.9 11/20/2015   PLT 142* 11/20/2015   LYMPHOPCT 30 11/19/2015   MONOPCT 9 11/19/2015   EOSPCT 2 11/19/2015   BASOPCT 0 11/19/2015    CMP:  Lab Results  Component Value Date   NA 139 11/20/2015   K 4.3 11/20/2015   CL 105 11/20/2015   CO2 23 11/20/2015   BUN 15 11/20/2015   CREATININE 1.03 11/20/2015   PROT 6.8 11/20/2015   ALBUMIN 3.7 11/20/2015   BILITOT 1.0 11/20/2015   ALKPHOS 79 11/20/2015   AST 21 11/20/2015   ALT 17 11/20/2015   Lab Results  Component Value Date   CHOL 109 11/20/2015   HDL 28* 11/20/2015   LDLCALC 71 11/20/2015   TRIG 52 11/20/2015   CHOLHDL 3.9 11/20/2015   Lab Results  Component Value Date   HGBA1C 6.4* 05/30/2014  Total Time in preparing paper work, data evaluation and todays exam - 35 minutes  Thurnell Lose M.D on 11/20/2015 at 2:16 PM  [facility]   Office  [phone]

## 2015-11-20 NOTE — Progress Notes (Signed)
SLP Cancellation Note  Patient Details Name: Ross Morgan MRN: [id number] DOB: 1928/03/12   Cancelled treatment:       Reason Eval/Treat Not Completed: SLP screened, no needs identified, will sign off   Blenda MountsCouture, Adreyan Carbajal Laurice 11/20/2015, 12:48 PM

## 2015-11-22 ENCOUNTER — Inpatient Hospital Stay (HOSPITAL_COMMUNITY)
Admission: EM | Admit: 2015-11-22 | Discharge: 2015-11-24 | DRG: 066 | Disposition: A | Discharge status: Home / Self Care | Payer: Medicare Other | Attending: Internal Medicine | Admitting: Internal Medicine

## 2015-11-22 ENCOUNTER — Emergency Department (HOSPITAL_COMMUNITY): Payer: Medicare Other

## 2015-11-22 ENCOUNTER — Encounter (HOSPITAL_COMMUNITY): Payer: Self-pay | Admitting: Emergency Medicine

## 2015-11-22 DIAGNOSIS — E118 Type 2 diabetes mellitus with unspecified complications: Secondary | ICD-10-CM

## 2015-11-22 DIAGNOSIS — Z9841 Cataract extraction status, right eye: Secondary | ICD-10-CM

## 2015-11-22 DIAGNOSIS — R471 Dysarthria and anarthria: Secondary | ICD-10-CM | POA: Diagnosis present

## 2015-11-22 DIAGNOSIS — Z7901 Long term (current) use of anticoagulants: Secondary | ICD-10-CM

## 2015-11-22 DIAGNOSIS — I480 Paroxysmal atrial fibrillation: Secondary | ICD-10-CM | POA: Diagnosis present

## 2015-11-22 DIAGNOSIS — R269 Unspecified abnormalities of gait and mobility: Secondary | ICD-10-CM | POA: Diagnosis present

## 2015-11-22 DIAGNOSIS — Z87891 Personal history of nicotine dependence: Secondary | ICD-10-CM | POA: Diagnosis not present

## 2015-11-22 DIAGNOSIS — Z7984 Long term (current) use of oral hypoglycemic drugs: Secondary | ICD-10-CM

## 2015-11-22 DIAGNOSIS — N400 Enlarged prostate without lower urinary tract symptoms: Secondary | ICD-10-CM | POA: Diagnosis present

## 2015-11-22 DIAGNOSIS — Z950 Presence of cardiac pacemaker: Secondary | ICD-10-CM

## 2015-11-22 DIAGNOSIS — Z961 Presence of intraocular lens: Secondary | ICD-10-CM | POA: Diagnosis present

## 2015-11-22 DIAGNOSIS — M1990 Unspecified osteoarthritis, unspecified site: Secondary | ICD-10-CM | POA: Diagnosis present

## 2015-11-22 DIAGNOSIS — H933X9 Disorders of unspecified acoustic nerve: Secondary | ICD-10-CM | POA: Diagnosis present

## 2015-11-22 DIAGNOSIS — F329 Major depressive disorder, single episode, unspecified: Secondary | ICD-10-CM | POA: Diagnosis present

## 2015-11-22 DIAGNOSIS — R4781 Slurred speech: Secondary | ICD-10-CM

## 2015-11-22 DIAGNOSIS — R001 Bradycardia, unspecified: Secondary | ICD-10-CM | POA: Diagnosis present

## 2015-11-22 DIAGNOSIS — I639 Cerebral infarction, unspecified: Secondary | ICD-10-CM | POA: Diagnosis present

## 2015-11-22 DIAGNOSIS — E785 Hyperlipidemia, unspecified: Secondary | ICD-10-CM | POA: Diagnosis present

## 2015-11-22 DIAGNOSIS — I6350 Cerebral infarction due to unspecified occlusion or stenosis of unspecified cerebral artery: Secondary | ICD-10-CM | POA: Diagnosis not present

## 2015-11-22 DIAGNOSIS — Z79899 Other long term (current) drug therapy: Secondary | ICD-10-CM

## 2015-11-22 DIAGNOSIS — E119 Type 2 diabetes mellitus without complications: Secondary | ICD-10-CM | POA: Diagnosis present

## 2015-11-22 DIAGNOSIS — I10 Essential (primary) hypertension: Secondary | ICD-10-CM | POA: Diagnosis present

## 2015-11-22 DIAGNOSIS — Z9842 Cataract extraction status, left eye: Secondary | ICD-10-CM

## 2015-11-22 DIAGNOSIS — R531 Weakness: Secondary | ICD-10-CM | POA: Diagnosis present

## 2015-11-22 LAB — CBC
HEMATOCRIT: 41 % (ref 39.0–52.0)
Hemoglobin: 13.3 g/dL (ref 13.0–17.0)
MCH: 32.2 pg (ref 26.0–34.0)
MCHC: 32.4 g/dL (ref 30.0–36.0)
MCV: 99.3 fL (ref 78.0–100.0)
PLATELETS: 144 10*3/uL — AB (ref 150–400)
RBC: 4.13 MIL/uL — AB (ref 4.22–5.81)
RDW: 12.7 % (ref 11.5–15.5)
WBC: 6.8 10*3/uL (ref 4.0–10.5)

## 2015-11-22 LAB — I-STAT CHEM 8, ED
BUN: 24 mg/dL — AB (ref 6–20)
CHLORIDE: 102 mmol/L (ref 101–111)
Calcium, Ion: 1.11 mmol/L — ABNORMAL LOW (ref 1.13–1.30)
Creatinine, Ser: 1.2 mg/dL (ref 0.61–1.24)
Glucose, Bld: 179 mg/dL — ABNORMAL HIGH (ref 65–99)
HEMATOCRIT: 43 % (ref 39.0–52.0)
Hemoglobin: 14.6 g/dL (ref 13.0–17.0)
Potassium: 4.3 mmol/L (ref 3.5–5.1)
SODIUM: 140 mmol/L (ref 135–145)
TCO2: 25 mmol/L (ref 0–100)

## 2015-11-22 LAB — DIFFERENTIAL
Basophils Absolute: 0 10*3/uL (ref 0.0–0.1)
Basophils Relative: 0 %
Eosinophils Absolute: 0.1 10*3/uL (ref 0.0–0.7)
Eosinophils Relative: 2 %
LYMPHS PCT: 35 %
Lymphs Abs: 2.3 10*3/uL (ref 0.7–4.0)
MONOS PCT: 6 %
Monocytes Absolute: 0.4 10*3/uL (ref 0.1–1.0)
NEUTROS ABS: 3.9 10*3/uL (ref 1.7–7.7)
Neutrophils Relative %: 57 %

## 2015-11-22 LAB — COMPREHENSIVE METABOLIC PANEL
ALK PHOS: 78 U/L (ref 38–126)
ALT: 19 U/L (ref 17–63)
ANION GAP: 9 (ref 5–15)
AST: 22 U/L (ref 15–41)
Albumin: 3.8 g/dL (ref 3.5–5.0)
BUN: 21 mg/dL — ABNORMAL HIGH (ref 6–20)
CO2: 25 mmol/L (ref 22–32)
Calcium: 9.4 mg/dL (ref 8.9–10.3)
Chloride: 106 mmol/L (ref 101–111)
Creatinine, Ser: 1.17 mg/dL (ref 0.61–1.24)
GFR calc non Af Amer: 55 mL/min — ABNORMAL LOW (ref >60)
Glucose, Bld: 184 mg/dL — ABNORMAL HIGH (ref 65–99)
Potassium: 4.5 mmol/L (ref 3.5–5.1)
SODIUM: 140 mmol/L (ref 135–145)
TOTAL PROTEIN: 7 g/dL (ref 6.5–8.1)
Total Bilirubin: 0.7 mg/dL (ref 0.3–1.2)

## 2015-11-22 LAB — APTT: aPTT: 30 seconds (ref 24–37)

## 2015-11-22 LAB — ETHANOL: Alcohol, Ethyl (B): <5 mg/dL (ref <5)

## 2015-11-22 LAB — PROTIME-INR
INR: 1.23 (ref 0.00–1.49)
PROTHROMBIN TIME: 15.7 s — AB (ref 11.6–15.2)

## 2015-11-22 LAB — I-STAT TROPONIN, ED: Troponin i, poc: 0.01 ng/mL (ref 0.00–0.08)

## 2015-11-22 LAB — GLUCOSE, CAPILLARY: GLUCOSE-CAPILLARY: 157 mg/dL — AB (ref 65–99)

## 2015-11-22 LAB — CBG MONITORING, ED: Glucose-Capillary: 137 mg/dL — ABNORMAL HIGH (ref 65–99)

## 2015-11-22 MED ORDER — ASPIRIN EC 325 MG PO TBEC
325.0000 mg | DELAYED_RELEASE_TABLET | Freq: Once | ORAL | Status: AC
  Administered 2015-11-22: 325 mg via ORAL
  Filled 2015-11-22: qty 1

## 2015-11-22 MED ORDER — SODIUM CHLORIDE 0.9 % IJ SOLN
3.0000 mL | Freq: Two times a day (BID) | INTRAMUSCULAR | Status: DC
  Administered 2015-11-22 – 2015-11-23 (×3): 3 mL via INTRAVENOUS

## 2015-11-22 MED ORDER — ATORVASTATIN CALCIUM 10 MG PO TABS
20.0000 mg | ORAL_TABLET | Freq: Every day | ORAL | Status: DC
  Administered 2015-11-22 – 2015-11-23 (×2): 20 mg via ORAL
  Filled 2015-11-22 (×2): qty 2

## 2015-11-22 MED ORDER — POLYETHYLENE GLYCOL 3350 17 G PO PACK
17.0000 g | PACK | ORAL | Status: DC
  Administered 2015-11-23 – 2015-11-24 (×2): 17 g via ORAL
  Filled 2015-11-22 (×2): qty 1

## 2015-11-22 MED ORDER — STROKE: EARLY STAGES OF RECOVERY BOOK
Freq: Once | Status: AC
  Administered 2015-11-22: 21:00:00

## 2015-11-22 MED ORDER — METFORMIN HCL 500 MG PO TABS
1000.0000 mg | ORAL_TABLET | Freq: Two times a day (BID) | ORAL | Status: DC
  Administered 2015-11-23 – 2015-11-24 (×3): 1000 mg via ORAL
  Filled 2015-11-22 (×4): qty 2

## 2015-11-22 MED ORDER — CITALOPRAM HYDROBROMIDE 10 MG PO TABS
10.0000 mg | ORAL_TABLET | Freq: Every day | ORAL | Status: DC
  Administered 2015-11-22 – 2015-11-23 (×2): 10 mg via ORAL
  Filled 2015-11-22 (×2): qty 1

## 2015-11-22 MED ORDER — PANTOPRAZOLE SODIUM 40 MG PO TBEC
40.0000 mg | DELAYED_RELEASE_TABLET | Freq: Every day | ORAL | Status: DC
  Administered 2015-11-23 – 2015-11-24 (×2): 40 mg via ORAL
  Filled 2015-11-22 (×4): qty 1

## 2015-11-22 MED ORDER — LINAGLIPTIN 5 MG PO TABS
5.0000 mg | ORAL_TABLET | Freq: Every day | ORAL | Status: DC
  Administered 2015-11-23 – 2015-11-24 (×2): 5 mg via ORAL
  Filled 2015-11-22 (×2): qty 1

## 2015-11-22 MED ORDER — SENNOSIDES-DOCUSATE SODIUM 8.6-50 MG PO TABS: 1.0000 | ORAL_TABLET | Freq: Every evening | ORAL | Status: DC | PRN

## 2015-11-22 MED ORDER — ALFUZOSIN HCL ER 10 MG PO TB24
10.0000 mg | ORAL_TABLET | Freq: Every evening | ORAL | Status: DC
  Administered 2015-11-23 (×2): 10 mg via ORAL
  Filled 2015-11-22 (×3): qty 1

## 2015-11-22 MED ORDER — SITAGLIPTIN PHOS-METFORMIN HCL 50-1000 MG PO TABS: 1.0000 | ORAL_TABLET | Freq: Two times a day (BID) | ORAL | Status: DC

## 2015-11-22 NOTE — ED Notes (Signed)
Pt CBG, 137. 

## 2015-11-22 NOTE — ED Notes (Signed)
Per EMS - pt seen 1/7 for TIA w/ resolved symptoms. Pt went to PCP this morning for left-sided facial droop, left-sided arm droop and left-sided leg drag. Pt has some slurred speech. Pt takes eliquis for Afib

## 2015-11-22 NOTE — H&P (Signed)
[facility] History and Physical  Ross Morgan CWC:[id number] DOB: [birth year] DOA: 11/22/2015  Referring physician: Esaw Grandchild, MD PCP: Marton Redwood, MD   Chief Complaint: Slurred speech.   HPI: Ross Morgan is a 82 y.o. male with a [ast medical history of paroxysmal atrial fibrillation, hypertension, hyperlipidemia, type 2 diabetes, depression, osteoarthritis who was discharged 2 days ago after an overnight admission for TIA symptoms and now returns today after he went to his primary care provider in the morning due to slurred speech, left-sided facial droop and left-sided weakness.  Per patient, last night he noticed that he has some difficulty getting out of the bathtub after he took a shower and then noticed left-sided weakness since this morning. He denies blurred vision, double vision, or language comprehension difficulty. After the symptoms this morning, he  decided to go to see his PCP who recommended for him to come to the emergency department. He is currently in no acute distress.  Review of Systems:  Constitutional:  No weight loss, night sweats, Fevers, chills, fatigue.  HEENT:  No headaches, Difficulty swallowing,Tooth/dental problems,Sore throat,  No sneezing, itching, ear ache, nasal congestion, post nasal drip,  Cardio-vascular:  No chest pain, Orthopnea, PND, swelling in lower extremities, anasarca, dizziness, palpitations  GI:  No heartburn, indigestion, abdominal pain, nausea, vomiting, diarrhea, change in bowel habits, loss of appetite  Resp:  No shortness of breath with exertion or at rest. No excess mucus, no productive cough, No non-productive cough, No coughing up of blood.No change in color of mucus.No wheezing.No chest wall deformity  Skin:  no rash or lesions.  GU:  no dysuria, change in color of urine, no urgency or frequency. No flank pain.  Musculoskeletal:  No joint pain or swelling. No decreased range of motion. No back pain.  Psych:    No change in mood or affect. No depression or anxiety. No memory loss.   Past Medical History  Diagnosis Date  . Hypertension   . Hyperlipidemia   . Anal fissure   . Knee pain 2015    "tore tendon left knee; no OR"  . Bradycardia   . Atrial fibrillation ([place])   . Type II diabetes mellitus ([place])   . Arthritis     "left pointer" (05/30/2014)  . Depression   . Symptomatic bradycardia     status post dual AV permanent transvenous pacemaker insertion  . On continuous oral anticoagulation    Past Surgical History  Procedure Laterality Date  . Cataract extraction w/ intraocular lens  implant, bilateral Bilateral 2000's  . Pacemaker insertion  05/31/14    STJ dual chamber pacemaker implanted by Dr Lovena Le for symptomatic bradycardia  . Permanent pacemaker insertion N/A 05/31/2014    Procedure: PERMANENT PACEMAKER INSERTION;  Surgeon: Evans Lance, MD;  Location: [facility] CATH LAB;  Service: Cardiovascular;  Laterality: N/A;   Social History:  reports that he has quit smoking. His smoking use included Cigarettes. He quit after 1 year of use. He has never used smokeless tobacco. He reports that he drinks alcohol. He reports that he does not use illicit drugs.  Allergies  Allergen Reactions  . Hctz [Hydrochlorothiazide] Other (See Comments)    Severe reaction  . Statins Other (See Comments)  . Zetia [Ezetimibe] Other (See Comments)    Family History  Problem Relation Age of Onset  . Heart attack Neg Hx     Prior to Admission medications   Medication Sig Start Date End Date Taking? Authorizing Provider  alfuzosin (  UROXATRAL) 10 MG 24 hr tablet Take 10 mg by mouth every evening.   Yes Historical Provider, MD  amLODipine (NORVASC) 10 MG tablet Take 10 mg by mouth daily.   Yes Historical Provider, MD  apixaban (ELIQUIS) 5 MG TABS tablet Take 1 tablet (5 mg total) by mouth 2 (two) times daily. 09/11/15  Yes Evans Lance, MD  atorvastatin (LIPITOR) 20 MG tablet Take 20 mg by mouth at  bedtime. 06/22/15  Yes Historical Provider, MD  Calcium Carbonate-Simethicone (ALKA-SELTZER HEARTBURN + GAS) 750-80 MG CHEW Chew 1-2 each by mouth at bedtime.   Yes Historical Provider, MD  citalopram (CELEXA) 10 MG tablet Take 10 mg by mouth at bedtime.    Yes Historical Provider, MD  Coenzyme Q10 (CO Q-10) 200 MG CAPS Take 1 capsule by mouth daily.    Yes Historical Provider, MD  fluorouracil (EFUDEX) 5 % cream Apply 1 application topically daily as needed (for irritation).   Yes Historical Provider, MD  Incontinent Wash [place]) LOTN Apply 1 application topically daily as needed (for irritation).    Yes Historical Provider, MD  lidocaine (XYLOCAINE) 5 % ointment Apply 1 application topically as needed. Patient taking differently: Apply 1 application topically as needed for moderate pain (pain).  1/32/44  Yes Leighton Ruff, MD  Lidocaine-Hydrocortisone Ace (ANAMANTLE HC) 3-0.5 % KIT Place 1 application rectally daily as needed (for irritation).    Yes Historical Provider, MD  Multiple Vitamin (MULTIVITAMIN) capsule Take 1 capsule by mouth daily.   Yes Historical Provider, MD  NON FORMULARY Place 1 drop into the left eye at bedtime. [employer] lubricant eye drops left eye only   Yes Historical Provider, MD  NON FORMULARY Place 1 application into the right eye at bedtime. Mura 5% eye ointment in right eye only   Yes Historical Provider, MD  pimecrolimus (ELIDEL) 1 % cream Apply 1 application topically daily as needed (for face).    Yes Historical Provider, MD  polyethylene glycol (MIRALAX / GLYCOLAX) packet Take 17 g by mouth every morning.   Yes Historical Provider, MD  sitaGLIPtin-metformin (JANUMET) 50-1000 MG per tablet Take 1 tablet by mouth 2 (two) times daily with a meal.   Yes Historical Provider, MD  valsartan (DIOVAN) 160 MG tablet Take 160 mg by mouth at bedtime.    Yes Historical Provider, MD  vitamin C (ASCORBIC ACID) 500 MG tablet Take 500 mg by mouth daily.   Yes Historical Provider, MD    zinc oxide (BALMEX) 11.3 % CREA cream Apply 1 application topically daily as needed (for rash).   Yes Historical Provider, MD   Physical Exam: Filed Vitals:   11/22/15 1821 11/22/15 1826 11/22/15 1830 11/22/15 1845  BP: 139/72  139/76 127/69  Pulse: 74  73 77  Temp:  98.5 F (36.9 C)    TempSrc:      Resp: '16  15 19  '$ SpO2: 97%  98% 96%    Wt Readings from Last 3 Encounters:  11/19/15 77.338 kg (170 lb 8 oz)  07/12/15 81.557 kg (179 lb 12.8 oz)  11/17/14 81.874 kg (180 lb 8 oz)    General:  Appears calm and comfortable Eyes: PERRL, normal lids, irises & conjunctiva ENT: grossly normal hearing, lips & tongue Neck: no LAD, masses or thyromegaly Cardiovascular: RRR, no m/r/g. No LE edema. Telemetry: Paced rhythm.  Respiratory: CTA bilaterally, no w/r/r. Normal respiratory effort. Abdomen: soft, ntnd Skin: no rash or induration seen on limited exam Musculoskeletal: grossly normal tone BUE/BLE Psychiatric: grossly  normal mood and affect, speech is slurred, but appropriate Neurologic: Awake, alert, oriented 3, 4/5 left-sided hemiparesis           Labs on Admission:  Basic Metabolic Panel:  Recent Labs Lab 11/19/15 1336 11/19/15 1345 11/20/15 0632 11/22/15 1656 11/22/15 1703  NA 139 139 139 140 140  K 4.8 4.8 4.3 4.5 4.3  CL 106 104 105 106 102  CO2 26  --  23 25  --   GLUCOSE 110* 107* 120* 184* 179*  BUN 20 26* 15 21* 24*  CREATININE 1.27* 1.10 1.03 1.17 1.20  CALCIUM 9.4  --  9.1 9.4  --    Liver Function Tests:  Recent Labs Lab 11/19/15 1336 11/20/15 0632 11/22/15 1656  AST '21 21 22  '$ ALT 16* 17 19  ALKPHOS 81 79 78  BILITOT 0.7 1.0 0.7  PROT 6.9 6.8 7.0  ALBUMIN 3.8 3.7 3.8   CBC:  Recent Labs Lab 11/19/15 1336 11/19/15 1345 11/20/15 0632 11/22/15 1656 11/22/15 1703  WBC 5.5  --  6.4 6.8  --   NEUTROABS 3.3  --   --  3.9  --   HGB 12.8* 14.3 13.5 13.3 14.6  HCT 39.7 42.0 41.9 41.0 43.0  MCV 99.5  --  99.1 99.3  --   PLT 138*  --  142*  144*  --     CBG:  Recent Labs Lab 11/19/15 1339 11/19/15 1957 11/20/15 0647 11/20/15 1218 11/22/15 1728  GLUCAP 98 126* 129* 109* 137*    Radiological Exams on Admission: Ct Head Wo Contrast  11/22/2015  CLINICAL DATA:  Cold stroke left-sided facial droop slurred speech EXAM: CT HEAD WITHOUT CONTRAST TECHNIQUE: Contiguous axial images were obtained from the base of the skull through the vertex without intravenous contrast. COMPARISON:  11/19/2015 FINDINGS: There is no evidence of hemorrhage or extra-axial fluid. No hydrocephalus identified. There is diffuse cortical atrophy, stable. There is mild low attenuation in the periventricular white matter which is unchanged as well. Evidence of a tiny lacunar infarct in the basal ganglia region on the left is stable. There is no evidence of vascular territory infarct. Calvarium is intact. IMPRESSION: Age-related atrophy and white matter change with no acute findings. Critical Value/emergent results were called by telephone at the time of interpretation on 11/22/2015 at 5:16 pm to Dr. Leonel Ramsay, who verbally acknowledged these results. Electronically Signed   By: Skipper Cliche M.D.   On: 11/22/2015 17:16   echocardiogram: 11/20/2015 ------------------------------------------------------------------- LV EF: 45% -  50%  ------------------------------------------------------------------- Indications:   CVA 436.  ------------------------------------------------------------------- History:  PMH:  Atrial fibrillation. Risk factors: Hypertension. Diabetes mellitus. Dyslipidemia.  ------------------------------------------------------------------- Study Conclusions  - Left ventricle: The cavity size was mildly dilated. Wall thickness was normal. Systolic function was mildly reduced. The estimated ejection fraction was in the range of 45% to 50%. - Aortic valve: There was mild regurgitation. - Left atrium: The atrium was mildly  dilated. - Right ventricle: The cavity size was mildly dilated. Systolic function was mildly reduced. - Right atrium: The atrium was moderately dilated. - Pericardium, extracardiac: A trivial pericardial effusion was identified.  EKG: Independently reviewed.  Vent. rate 72 BPM PR interval 117 ms QRS duration 189 ms QT/QTc 394/431 ms P-R-T axes 0 76 -68 Ventricular-paced complexes No further analysis attempted due to paced rhythm Atrial fibrillation is not present on current eCG  Assessment/Plan Principal Problem:   Acute CVA (cerebrovascular accident) ([place])   Left-sided weakness Admit to telemetry. Stroke workup already done  2-3 days ago. Frequent neuro checks. Hold Eliquis tonight and give a dose of aspirin per neurology. Repeat CT scan of the head in the morning per neurology. PT/OT and speech therapy evaluation in a.m.  Active Problems:   Essential hypertension Per neurology, permissive hypertension to 220-120 mmHg. Hold antihypertensives until reevaluated by neurology. Follow-up blood pressure closely.    Hyperlipidemia Continue atorvastatin 20 mg by mouth at bedtime. Monitor LFTs periodically.    Type II diabetes mellitus ([facility]) Carbohydrate modified diet. CBG monitoring before meals.    Paroxysmal a-fib ([facility])  CHA2DS2-VASc score was 8. Status post pacemaker placement. Rate is currently controlled. Resume anticoagulation once cleared by neurology.    Greta Doom, M.D. from the neurology service has consulted on the patient.   Code Status: Full code. DVT Prophylaxis: On full dose anticoagulation. Family Communication:  Disposition Plan: Admit to telemetry, neuro checks and repeat CT scan in a.m.  Time spent: Over 70 minutes were spent in the process of this admission.  Reubin Milan [facility] Pager [phone]. Chest

## 2015-11-22 NOTE — ED Provider Notes (Signed)
CSN: [id number]     Arrival date & time 11/22/15  1652 History   First MD Initiated Contact with Patient 11/22/15 1704     Chief Complaint  Patient presents with  . Code Stroke     (Consider location/radiation/quality/duration/timing/severity/associated sxs/prior Treatment) Patient is a 82 y.o. male presenting with neurologic complaint.  Neurologic Problem This is a recurrent problem. The current episode started today. The problem occurs constantly. The problem has been gradually improving. Associated symptoms include weakness (Pt reports left sided weakness). Pertinent negatives include no abdominal pain, anorexia, arthralgias, change in bowel habit, chest pain, chills, congestion, coughing, diaphoresis, fatigue, fever, headaches, joint swelling, myalgias, nausea, neck pain, numbness, rash, sore throat, swollen glands, urinary symptoms, vertigo, visual change or vomiting. Nothing aggravates the symptoms. He has tried nothing for the symptoms. The treatment provided no relief.    Past Medical History  Diagnosis Date  . Hypertension   . Hyperlipidemia   . Anal fissure   . Knee pain 2015    "tore tendon left knee; no OR"  . Bradycardia   . Atrial fibrillation ([place])   . Type II diabetes mellitus ([place])   . Arthritis     "left pointer" (05/30/2014)  . Depression   . Symptomatic bradycardia     status post dual AV permanent transvenous pacemaker insertion  . On continuous oral anticoagulation    Past Surgical History  Procedure Laterality Date  . Cataract extraction w/ intraocular lens  implant, bilateral Bilateral 2000's  . Pacemaker insertion  05/31/14    STJ dual chamber pacemaker implanted by Dr Lovena Le for symptomatic bradycardia  . Permanent pacemaker insertion N/A 05/31/2014    Procedure: PERMANENT PACEMAKER INSERTION;  Surgeon: Evans Lance, MD;  Location: [facility] CATH LAB;  Service: Cardiovascular;  Laterality: N/A;   Family History  Problem Relation Age of Onset  . Heart attack  Neg Hx    Social History  Substance Use Topics  . Smoking status: Former Smoker -- 1 years    Types: Cigarettes  . Smokeless tobacco: Never Used     Comment: "quit smoking in ~ 1952  . Alcohol Use: Yes     Comment: 05/30/2014 "glass of wine ~ once/month"    Review of Systems  Constitutional: Negative for fever, chills, diaphoresis and fatigue.  HENT: Negative for congestion and sore throat.   Eyes: Negative for pain.  Respiratory: Negative for cough.   Cardiovascular: Negative for chest pain.  Gastrointestinal: Negative for nausea, vomiting, abdominal pain, anorexia and change in bowel habit.  Genitourinary: Negative for dysuria.  Musculoskeletal: Negative for myalgias, joint swelling, arthralgias and neck pain.  Skin: Negative for rash.  Neurological: Positive for speech difficulty and weakness (Pt reports left sided weakness). Negative for vertigo, numbness and headaches.  Psychiatric/Behavioral: Negative for confusion.      Allergies  Hctz; Statins; and Zetia  Home Medications   Prior to Admission medications   Medication Sig Start Date End Date Taking? Authorizing Provider  alfuzosin (UROXATRAL) 10 MG 24 hr tablet Take 10 mg by mouth every evening.   Yes Historical Provider, MD  amLODipine (NORVASC) 10 MG tablet Take 10 mg by mouth daily.   Yes Historical Provider, MD  apixaban (ELIQUIS) 5 MG TABS tablet Take 1 tablet (5 mg total) by mouth 2 (two) times daily. 09/11/15  Yes Evans Lance, MD  atorvastatin (LIPITOR) 20 MG tablet Take 20 mg by mouth at bedtime. 06/22/15  Yes Historical Provider, MD  Calcium Carbonate-Simethicone (ALKA-SELTZER HEARTBURN +  GAS) 750-80 MG CHEW Chew 1-2 each by mouth at bedtime.   Yes Historical Provider, MD  citalopram (CELEXA) 10 MG tablet Take 10 mg by mouth at bedtime.    Yes Historical Provider, MD  Coenzyme Q10 (CO Q-10) 200 MG CAPS Take 1 capsule by mouth daily.    Yes Historical Provider, MD  fluorouracil (EFUDEX) 5 % cream Apply 1  application topically daily as needed (for irritation).   Yes Historical Provider, MD  Incontinent Wash [place]) LOTN Apply 1 application topically daily as needed (for irritation).    Yes Historical Provider, MD  lidocaine (XYLOCAINE) 5 % ointment Apply 1 application topically as needed. Patient taking differently: Apply 1 application topically as needed for moderate pain (pain).  03/29/83  Yes Leighton Ruff, MD  Lidocaine-Hydrocortisone Ace (ANAMANTLE HC) 3-0.5 % KIT Place 1 application rectally daily as needed (for irritation).    Yes Historical Provider, MD  Multiple Vitamin (MULTIVITAMIN) capsule Take 1 capsule by mouth daily.   Yes Historical Provider, MD  NON FORMULARY Place 1 drop into the left eye at bedtime. [employer]S lubricant eye drops left eye only   Yes Historical Provider, MD  NON FORMULARY Place 1 application into the right eye at bedtime. Mura 5% eye ointment in right eye only   Yes Historical Provider, MD  pimecrolimus (ELIDEL) 1 % cream Apply 1 application topically daily as needed (for face).    Yes Historical Provider, MD  polyethylene glycol (MIRALAX / GLYCOLAX) packet Take 17 g by mouth every morning.   Yes Historical Provider, MD  sitaGLIPtin-metformin (JANUMET) 50-1000 MG per tablet Take 1 tablet by mouth 2 (two) times daily with a meal.   Yes Historical Provider, MD  valsartan (DIOVAN) 160 MG tablet Take 160 mg by mouth at bedtime.    Yes Historical Provider, MD  vitamin C (ASCORBIC ACID) 500 MG tablet Take 500 mg by mouth daily.   Yes Historical Provider, MD  zinc oxide (BALMEX) 11.3 % CREA cream Apply 1 application topically daily as needed (for rash).   Yes Historical Provider, MD   BP 139/76 mmHg  Pulse 73  Temp(Src) 98.5 F (36.9 C) (Oral)  Resp 15  SpO2 98% Physical Exam  Constitutional: He appears well-developed and well-nourished.  HENT:  Head: Normocephalic and atraumatic.  Eyes: Conjunctivae and EOM are normal. Pupils are equal, round, and reactive to light.   Neck: Normal range of motion.  Cardiovascular: Normal rate and regular rhythm.  Exam reveals no gallop and no friction rub.   No murmur heard. Pulmonary/Chest: Effort normal and breath sounds normal. No respiratory distress. He has no wheezes. He has no rales. He exhibits no tenderness.  Abdominal: Soft. Bowel sounds are normal. He exhibits no distension and no mass. There is no tenderness. There is no rebound and no guarding.  Neurological: He is alert. He has normal strength. No cranial nerve deficit or sensory deficit. GCS eye subscore is 4. GCS verbal subscore is 5. GCS motor subscore is 6.  No obvious weakness in LUE or LLE when compared to right. Pt reports he feels weaker.  Skin: Skin is warm and dry. He is not diaphoretic.  Psychiatric: He has a normal mood and affect. His behavior is normal. His speech is slurred.    ED Course  Procedures (including critical care time) Labs Review Labs Reviewed  PROTIME-INR - Abnormal; Notable for the following:    Prothrombin Time 15.7 (*)    All other components within normal limits  CBC -  Abnormal; Notable for the following:    RBC 4.13 (*)    Platelets 144 (*)    All other components within normal limits  COMPREHENSIVE METABOLIC PANEL - Abnormal; Notable for the following:    Glucose, Bld 184 (*)    BUN 21 (*)    GFR calc non Af Amer 55 (*)    All other components within normal limits  I-STAT CHEM 8, ED - Abnormal; Notable for the following:    BUN 24 (*)    Glucose, Bld 179 (*)    Calcium, Ion 1.11 (*)    All other components within normal limits  CBG MONITORING, ED - Abnormal; Notable for the following:    Glucose-Capillary 137 (*)    All other components within normal limits  ETHANOL  APTT  DIFFERENTIAL  URINE RAPID DRUG SCREEN, HOSP PERFORMED  URINALYSIS, ROUTINE W REFLEX MICROSCOPIC (NOT AT [facility])  I-STAT TROPOININ, ED    Imaging Review Ct Head Wo Contrast  11/22/2015  CLINICAL DATA:  Cold stroke left-sided facial droop  slurred speech EXAM: CT HEAD WITHOUT CONTRAST TECHNIQUE: Contiguous axial images were obtained from the base of the skull through the vertex without intravenous contrast. COMPARISON:  11/19/2015 FINDINGS: There is no evidence of hemorrhage or extra-axial fluid. No hydrocephalus identified. There is diffuse cortical atrophy, stable. There is mild low attenuation in the periventricular white matter which is unchanged as well. Evidence of a tiny lacunar infarct in the basal ganglia region on the left is stable. There is no evidence of vascular territory infarct. Calvarium is intact. IMPRESSION: Age-related atrophy and white matter change with no acute findings. Critical Value/emergent results were called by telephone at the time of interpretation on 11/22/2015 at 5:16 pm to Dr. Leonel Ramsay, who verbally acknowledged these results. Electronically Signed   By: Skipper Cliche M.D.   On: 11/22/2015 17:16   I have personally reviewed and evaluated these images and lab results as part of my medical decision-making.   EKG Interpretation   Date/Time:  Wednesday November 22 2015 17:11:06 EST Ventricular Rate:  72 PR Interval:  117 QRS Duration: 189 QT Interval:  394 QTC Calculation: 431 R Axis:   76 Text Interpretation:  Ventricular-paced complexes No further analysis  attempted due to paced rhythm Atrial fibrillation is not present on  current eCG Confirmed by KNAPP  MD-J, JON ([id number]) on 11/22/2015 5:26:25 PM      MDM   Final diagnoses:  Slurred speech  Left-sided weakness    82 year old white male with past medical history of atrial fibrillation and pacemaker on Eliquis who presents in the setting of left-sided weakness and slurred speech. Patient was seen 3 days ago for TIA symptoms including slurred speech. Patient started on medications and discharged home with plan to follow up PCP today. At PCP visit patient reported the PCP that he had some new left-sided weakness today and was brought to the  emergency department for further evaluation.  Code stroke was called and patient initially seen by neurology team and emergency medicine team. Patient's airway was intact and patient sent to CT scanner. CT head did not reveal acute infarct. Patient noted on exam to have continued slurred speech and no obvious left sided weakness. Patient does report he still feels slight subjective left-sided weakness. He reports it is improving from earlier. In setting this finding neurology believes this is completion of infarct from previous TIA. At this time patient will require admission to medicine for repeat CT head tomorrow and further  CVA management. No significant laboratory abnormalities noted. Patient stable at time of admission and in agreement with plan.  Attending has seen and evaluated and Dr. Tomi Bamberger is in agreement with plan.    Esaw Grandchild, MD 11/22/15 [id number]  Dorie Rank, MD 11/22/15 1901

## 2015-11-22 NOTE — Progress Notes (Signed)
Code stroke was called at 1641, [facility].  As per patient, was recently discharged with TIA, he states he noticed his symptoms last pm 11/21/15 around 2030 while he was taking a bath and was having trouble getting out of the tub, and this morning symptoms had gotten worse so   He went to his primary care MD for  appt and was sent here by EMS.

## 2015-11-22 NOTE — Consult Note (Signed)
Neurology Consultation Reason for Consult: stroke Referring Physician: Roselyn BeringKnapp, J  CC: left sided weakness  History is obtained from: Patient  HPI: Ross Morgan is a 82 y.o. male with a history of recent TIA discharged on 11/08. He noticed after getting out of a hot bath last night that he noticed that he had difficulty getting out of the bath. He then noticed thathe got worse this mornign and went to see his PCP. His PCP advised him to return to the ER given left sided weakness and facial droop.    LKW: last night tpa given?: no, out of window, on eliquis.     ROS: A 14 point ROS was performed and is negative except as noted in the HPI.   Past Medical History  Diagnosis Date  . Hypertension   . Hyperlipidemia   . Anal fissure   . Knee pain 2015    "tore tendon left knee; no OR"  . Bradycardia   . Atrial fibrillation (HCC)   . Type II diabetes mellitus (HCC)   . Arthritis     "left pointer" (05/30/2014)  . Depression   . Symptomatic bradycardia     status post dual AV permanent transvenous pacemaker insertion  . On continuous oral anticoagulation      Family History  Problem Relation Age of Onset  . Heart attack Neg Hx      Social History:  reports that he has quit smoking. His smoking use included Cigarettes. He quit after 1 year of use. He has never used smokeless tobacco. He reports that he drinks alcohol. He reports that he does not use illicit drugs.   Exam: Current vital signs: Filed Vitals:   11/22/15 1712 11/22/15 1717  BP: 141/64 141/64  Pulse: 70 77  Temp:  98.3 F (36.8 C)  Resp: 17 17   Vital signs in last 24 hours:    Physical Exam  Constitutional: Appears well-developed and well-nourished.  Psych: Affect appropriate to situation Eyes: No scleral injection HENT: No OP obstrucion Head: Normocephalic.  Cardiovascular: Normal rate and regular rhythm.  Respiratory: Effort normal and breath sounds normal to anterior ascultation GI: Soft.  No  distension. There is no tenderness.  Skin: WDI  Neuro: Mental Status: Patient is awake, alert, oriented to person, place, month, year, and situation. Patient is able to give a clear and coherent history. No signs of aphasia or neglect Cranial Nerves: II: Visual Fields are full. Pupils are equal, round, and reactive to light.   III,IV, VI: EOMI without ptosis or diploplia.  V: Facial sensation is symmetric to temperature VII: Facial movement is notable for prominent left facial droop.  VIII: hearing is intact to voice X: Uvula elevates symmetrically XI: Shoulder shrug is symmetric. XII: tongue is midline without atrophy or fasciculations.  Motor: Tone is normal. Bulk is normal. 5/5 strength was present on the right, 4+/5 in teh left arm with some drift, no drift in the left leg.  Sensory: Sensation is symmetric to pin in the arms and legs. Cerebellar: FNF and HKS are intact on the right, on the left he has dysmetria on FNF in the left arm out of proportion to weakness.    I have reviewed labs in epic and the results pertinent to this consultation are: Chem 8  LDL71 - on atorvastatin  I have reviewed the images obtained:CT head - negaitve  Impression: 82 yo M with recurrent left sided symptoms. I suspect that he has completed the infarct that was threatened  a few days ago. He will need evaluation by ST, OT but I do not think that a full workup needs to be repeated. I would favor holding eliquis tonight and giving a dose of ASA. A repeat head CT tomorrow can assess for large volume of infarct and help guide when to restart.   Recommendations: 1) Repeat head CT tomorrow 2) ASA 3) PT, OT, ST 4) Permissive hypertension to 220/120 5) stroke team to follow.   Ritta Slot, MD [facility] [phone]  If 7pm- 7am, please page neurology on call as listed in AMION.

## 2015-11-23 ENCOUNTER — Inpatient Hospital Stay (HOSPITAL_COMMUNITY): Payer: Medicare Other

## 2015-11-23 DIAGNOSIS — I10 Essential (primary) hypertension: Secondary | ICD-10-CM

## 2015-11-23 DIAGNOSIS — I639 Cerebral infarction, unspecified: Principal | ICD-10-CM

## 2015-11-23 DIAGNOSIS — E785 Hyperlipidemia, unspecified: Secondary | ICD-10-CM

## 2015-11-23 DIAGNOSIS — I480 Paroxysmal atrial fibrillation: Secondary | ICD-10-CM

## 2015-11-23 LAB — BASIC METABOLIC PANEL
ANION GAP: 6 (ref 5–15)
BUN: 15 mg/dL (ref 6–20)
CALCIUM: 8.6 mg/dL — AB (ref 8.9–10.3)
CHLORIDE: 109 mmol/L (ref 101–111)
CO2: 26 mmol/L (ref 22–32)
CREATININE: 1.04 mg/dL (ref 0.61–1.24)
GFR calc Af Amer: >60 mL/min (ref >60)
GFR calc non Af Amer: >60 mL/min (ref >60)
Glucose, Bld: 112 mg/dL — ABNORMAL HIGH (ref 65–99)
Potassium: 4 mmol/L (ref 3.5–5.1)
Sodium: 141 mmol/L (ref 135–145)

## 2015-11-23 LAB — GLUCOSE, CAPILLARY
GLUCOSE-CAPILLARY: 153 mg/dL — AB (ref 65–99)
GLUCOSE-CAPILLARY: 138 mg/dL — AB (ref 65–99)
Glucose-Capillary: 116 mg/dL — ABNORMAL HIGH (ref 65–99)
Glucose-Capillary: 137 mg/dL — ABNORMAL HIGH (ref 65–99)

## 2015-11-23 LAB — CBC
HEMATOCRIT: 35.6 % — AB (ref 39.0–52.0)
Hemoglobin: 11.9 g/dL — ABNORMAL LOW (ref 13.0–17.0)
MCH: 33.1 pg (ref 26.0–34.0)
MCHC: 33.4 g/dL (ref 30.0–36.0)
MCV: 98.9 fL (ref 78.0–100.0)
Platelets: 115 10*3/uL — ABNORMAL LOW (ref 150–400)
RBC: 3.6 MIL/uL — ABNORMAL LOW (ref 4.22–5.81)
RDW: 12.6 % (ref 11.5–15.5)
WBC: 6.6 10*3/uL (ref 4.0–10.5)

## 2015-11-23 MED ORDER — DABIGATRAN ETEXILATE MESYLATE 150 MG PO CAPS
150.0000 mg | ORAL_CAPSULE | Freq: Two times a day (BID) | ORAL | Status: DC
  Administered 2015-11-23 – 2015-11-24 (×4): 150 mg via ORAL
  Filled 2015-11-23 (×4): qty 1

## 2015-11-23 NOTE — Progress Notes (Signed)
[facility] PROGRESS NOTE  Ross Morgan ZOX:[id number] DOB: 03-Sep-1928 DOA: 11/22/2015 PCP: [name], MD  Assessment/Plan:  #1  Probable acute CVA  patient presented with slurred speech and left-sided weakness likely due to a small right subcortical infarct nor visualized on head CT. Patient with a history of atrial fibrillation and despite being on anticoagulation still develop recurrent stroke. Patient with recent stroke workup. Unable to get MRI of the head due to pacemaker.  LDL was 71.PT/OT/ST. Patient eliquis  Has been discontinued and patient transition to Pradaxa per neurology for secondary stroke prevention. Neurology following.   #2 atrial fibrillation CHA2DS2VASC SCORE 8.  Currently rate controlled.Eliquis has been changed up pradaxa per neurology. Follow.  #3 diabetes mellitus  continue current home regimen. Follow.   #4  Hyperlipidemia  LDL was 71. Continue statin.  #5  Hypertension  permissive hypertension. Antihypertensives on hold. Will resume tomorrow.   #6  Prophylaxis Pradaxa  For DVT prophylaxis.  Code Status:  full Family Communication:  Updated patient. No family at bedside. Disposition Plan:  Home when medically stable and per neurology. Hopefully 1-2 days.   Consultants:   neurology: Dr. Amada Jupiter 11/22/2015  Procedures:   CT head without contrast 11/22/2015, 11/23/2015  Antibiotics:   none  HPI/Subjective:  patient with dysarthric speech. Patient states some improvement with strength on the left side.  Objective: Filed Vitals:   11/23/15 0800 11/23/15 1410  BP: 135/61 136/64  Pulse: 69 70  Temp: 97.9 F (36.6 C) 98.3 F (36.8 C)  Resp: 18 18    Intake/Output Summary (Last 24 hours) at 11/23/15 1731 Last data filed at 11/22/15 1947  Gross per 24 hour  Intake   1000 ml  Output      0 ml  Net   1000 ml   Filed Weights   11/22/15 1958  Weight: 74.027 kg (163 lb 3.2 oz)    Exam:   General:  NAD  Cardiovascular:  RRR  Respiratory: CTAB  Abdomen:  Soft, nontender, nondistended, positive bowel sounds.  Musculoskeletal:  No clubbing cyanosis or edema.   Neuro: Patient with some dysarthric speech.  Data Reviewed: Basic Metabolic Panel:  Recent Labs Lab 11/19/15 1336 11/19/15 1345 11/20/15 0632 11/22/15 1656 11/22/15 1703 11/23/15 0517  NA 139 139 139 140 140 141  K 4.8 4.8 4.3 4.5 4.3 4.0  CL 106 104 105 106 102 109  CO2 26  --  23 25  --  26  GLUCOSE 110* 107* 120* 184* 179* 112*  BUN 20 26* 15 21* 24* 15  CREATININE 1.27* 1.10 1.03 1.17 1.20 1.04  CALCIUM 9.4  --  9.1 9.4  --  8.6*   Liver Function Tests:  Recent Labs Lab 11/19/15 1336 11/20/15 0632 11/22/15 1656  AST 21 21 22   ALT 16* 17 19  ALKPHOS 81 79 78  BILITOT 0.7 1.0 0.7  PROT 6.9 6.8 7.0  ALBUMIN 3.8 3.7 3.8   No results for input(s): LIPASE, AMYLASE in the last 168 hours. No results for input(s): AMMONIA in the last 168 hours. CBC:  Recent Labs Lab 11/19/15 1336 11/19/15 1345 11/20/15 0632 11/22/15 1656 11/22/15 1703 11/23/15 0517  WBC 5.5  --  6.4 6.8  --  6.6  NEUTROABS 3.3  --   --  3.9  --   --   HGB 12.8* 14.3 13.5 13.3 14.6 11.9*  HCT 39.7 42.0 41.9 41.0 43.0 35.6*  MCV 99.5  --  99.1 99.3  --  98.9  PLT 138*  --  142* 144*  --  115*   Cardiac Enzymes: No results for input(s): CKTOTAL, CKMB, CKMBINDEX, TROPONINI in the last 168 hours. BNP (last 3 results) No results for input(s): BNP in the last 8760 hours.  ProBNP (last 3 results) No results for input(s): PROBNP in the last 8760 hours.  CBG:  Recent Labs Lab 11/22/15 1728 11/22/15 2050 11/23/15 0631 11/23/15 1138 11/23/15 1638  GLUCAP 137* 157* 116* 153* 137*    No results found for this or any previous visit (from the past 240 hour(s)).   Studies: Ct Head Wo Contrast  11/23/2015  CLINICAL DATA:  Followup of acute CVA. EXAM: CT HEAD WITHOUT CONTRAST TECHNIQUE: Contiguous axial images were obtained from the base of the skull  through the vertex without intravenous contrast. COMPARISON:  Multiple priors, including 1 day prior. FINDINGS: Sinuses/Soft tissues: Clear paranasal sinuses and mastoid air cells. Intracranial: Expected cerebral volume loss for age. Mild for age low density in the periventricular white matter likely related to small vessel disease. No mass lesion, hemorrhage, hydrocephalus, acute infarct, intra-axial, or extra-axial fluid collection. IMPRESSION: 1. Normal head CT for age. 2. Expected cerebral volume loss and relatively mild small vessel ischemic change. Electronically Signed   By: Jeronimo GreavesKyle  Talbot M.D.   On: 11/23/2015 08:19   Ct Head Wo Contrast  11/22/2015  CLINICAL DATA:  Cold stroke left-sided facial droop slurred speech EXAM: CT HEAD WITHOUT CONTRAST TECHNIQUE: Contiguous axial images were obtained from the base of the skull through the vertex without intravenous contrast. COMPARISON:  11/19/2015 FINDINGS: There is no evidence of hemorrhage or extra-axial fluid. No hydrocephalus identified. There is diffuse cortical atrophy, stable. There is mild low attenuation in the periventricular white matter which is unchanged as well. Evidence of a tiny lacunar infarct in the basal ganglia region on the left is stable. There is no evidence of vascular territory infarct. Calvarium is intact. IMPRESSION: Age-related atrophy and white matter change with no acute findings. Critical Value/emergent results were called by telephone at the time of interpretation on 11/22/2015 at 5:16 pm to Dr. Amada JupiterKirkpatrick, who verbally acknowledged these results. Electronically Signed   By: Esperanza Heiraymond  Rubner M.D.   On: 11/22/2015 17:16    Scheduled Meds: . alfuzosin  10 mg Oral QPM  . atorvastatin  20 mg Oral QHS  . citalopram  10 mg Oral QHS  . dabigatran  150 mg Oral Q12H  . linagliptin  5 mg Oral Daily  . metFORMIN  1,000 mg Oral BID WC  . pantoprazole  40 mg Oral Daily  . polyethylene glycol  17 g Oral BH-q7a  . sodium chloride  3  mL Intravenous Q12H   Continuous Infusions:   Principal Problem:   Acute CVA (cerebrovascular accident) (HCC) Active Problems:   Essential hypertension   Hyperlipidemia   Type II diabetes mellitus (HCC)   Paroxysmal a-fib (HCC)   Left-sided weakness    Time spent:  35 minutes    Ladell Bey  M.D. [facility] Pager [phone]. If 7PM-7AM, please contact night-coverage at [email or web address], password [email or web address] 11/23/2015, 5:31 PM  LOS: 1 day

## 2015-11-23 NOTE — Progress Notes (Signed)
STROKE TEAM PROGRESS NOTE   HISTORY Ross Morgan is a 82 y.o. male with a history of recent TIA discharged on 11/19/15. He noticed after getting out of a hot bath last night that he noticed that he had difficulty getting out of the bath. He then noticed thathe got worse this mornign and went to see his PCP. His PCP advised him to return to the ER given left sided weakness and facial droop. He was LKW 11/21/2015. Patient was not administered TPA secondary to out of window, on eliquis. He was admitted for further evaluation and treatment.   SUBJECTIVE (INTERVAL HISTORY) His SLP is at the bedside.  Recurrent slurred speech, new left foot dragging. Overall he feels his condition is stable since yesterday.    OBJECTIVE Temp:  [97.9 F (36.6 C)-99 F (37.2 C)] 97.9 F (36.6 C) (01/12 0800) Pulse Rate:  [67-77] 69 (01/12 0800) Cardiac Rhythm:  [-] Ventricular paced (01/12 0700) Resp:  [12-22] 18 (01/12 0800) BP: (109-141)/(46-86) 135/61 mmHg (01/12 0800) SpO2:  [95 %-99 %] 95 % (01/12 0800) Weight:  [74.027 kg (163 lb 3.2 oz)] 74.027 kg (163 lb 3.2 oz) (01/11 1958)  CBC:  Recent Labs Lab 11/19/15 1336  11/22/15 1656 11/22/15 1703 11/23/15 0517  WBC 5.5  < > 6.8  --  6.6  NEUTROABS 3.3  --  3.9  --   --   HGB 12.8*  < > 13.3 14.6 11.9*  HCT 39.7  < > 41.0 43.0 35.6*  MCV 99.5  < > 99.3  --  98.9  PLT 138*  < > 144*  --  115*  < > = values in this interval not displayed.  Basic Metabolic Panel:   Recent Labs Lab 11/22/15 1656 11/22/15 1703 11/23/15 0517  NA 140 140 141  K 4.5 4.3 4.0  CL 106 102 109  CO2 25  --  26  GLUCOSE 184* 179* 112*  BUN 21* 24* 15  CREATININE 1.17 1.20 1.04  CALCIUM 9.4  --  8.6*    Lipid Panel:     Component Value Date/Time   CHOL 109 11/20/2015 0632   TRIG 52 11/20/2015 0632   HDL 28* 11/20/2015 0632   CHOLHDL 3.9 11/20/2015 0632   VLDL 10 11/20/2015 0632   LDLCALC 71 11/20/2015 0632   HgbA1c:  Lab Results  Component Value Date    HGBA1C 6.4* 05/30/2014   Urine Drug Screen: No results found for: LABOPIA, COCAINSCRNUR, LABBENZ, AMPHETMU, THCU, LABBARB    IMAGING  Ct Head Wo Contrast 11/23/2015  1. Normal head CT for age. 2. Expected cerebral volume loss and relatively mild small vessel ischemic change.   Ct Head Wo Contrast 11/22/2015   Age-related atrophy and white matter change with no acute findings.   PHYSICAL EXAM Pleasant elderly Caucasian male currently not in distress. . Afebrile. Head is nontraumatic. Neck is supple without bruit.    Cardiac exam no murmur or gallop. Lungs are clear to auscultation. Distal pulses are well felt. Neurological Exam :  Awake alert oriented x 3 normal speech and language. Mild left lower face asymmetry. Tongue midline. No drift. Mild diminished fine finger movements on left. Orbits right over left upper extremity. Mild left grip weak.. Normal sensation . Normal coordination. ASSESSMENT/PLAN [name] is a 82 y.o. male with history of AF on anticoagulation, HTN, DM, HLD, recent TIA presenting with slurred speech, L foot drop, L facial. He did not receive IV t-PA due to out of window, on  eliquis.   Stroke:  Non-dominant right small infarct too small to be seen on imaging. Infarct felt to be embolic secondary to known atrial fibrillation   Resultant  Dysarthria, L facial, mild distal L hand weakness (decreased FMM)  MRI  / MRA  pacer  ecent CT head and neck with no acute abnormality  2-D echo EF 45-50% ( down from previous 60^, found during last admission, OP evaluation  by cardiology planned)  LDL 71  HgbA1c ordered  eliquis for VTE prophylaxis Diet Heart Room service appropriate?: Yes; Fluid consistency:: Thin  Eliquis (apixaban) daily prior to admission, Eliquis on hold for first hospital day and aspirin given, change to Pradaxa (dabigatran) twice a day. Will ask pharmacy to transition (there is no research to support this change, however, OACs are not 100%  effective, given previous TIA and now stroke, seems prudent to change)  Patient counseled to be compliant with his antithrombotic medications  Ongoing aggressive stroke risk factor management  Therapy recommendations:  pending   Disposition:  pending   Atrial Fibrillation  Home anticoagulation:  eliquis continued in the hospital  CHA2DS2-VASc score was 8.  Change to pradaxa. Pharmacy consulted    Hypertension  Stable  Permissive hypertension (OK if < 220/120) but gradually normalize in 5-7 days  Hyperlipidemia  Home meds:  Lipitor 20, resumed in hospital  LDL 71, goal < 70  Continue statin at discharge  Diabetes type II  HgbA1c pending, goal < 7.0  Other Stroke Risk Factors  Advanced age  Former Cigarette smoker  ETOH use  Hx stroke/TIA -   11/2015 TIA w/ vertigo that resolved  Hospital day # 1  Ross MoodyBIBY,Ross  [facility] See Amion for Pager information 11/23/2015 10:49 AM  I have personally examined this patient, reviewed notes, independently viewed imaging studies, participated in medical decision making and plan of care. I have made any additions or clarifications directly to the above note. Agree with note above.  He presented with left-sided weakness likely due to small right subcortical infarct not visualized on CT scan. He has atrial fibrillation and despite anticoagulation remains at risk for recurrent stroke, TIA neurological worsening. Patient has recently had adequate stroke evaluation and hence does not need further testing. I had a long discussion with the patient with regards to lack of evidence suggesting switching from one new anticoagulant to another his not necessarily superior. However since he is had 2 strokes now on eliquis I recommend changing over to Pradaxa for secondary stroke prevention. He is in agreement with the plan.  Delia HeadyPramod Sethi, MD Medical Director [facility] Pager: [phone] 11/23/2015 4:25  PM    To contact Stroke Continuity provider, please refer to [email or web address]. After hours, contact General Neurology

## 2015-11-23 NOTE — Care Management Note (Signed)
Case Management Note  Patient Details  Name: Ross Morgan MRN: [id number] Date of Birth: 04/11/1928  Subjective/Objective:   Patient admitted with CVA. Patient is from [facility] with his wife.                 Action/Plan: Awaiting PT/OT recommendations. CM continuing to follow for discharge needs.   Expected Discharge Date:                  Expected Discharge Plan:     In-House Referral:     Discharge planning Services     Post Acute Care Choice:    Choice offered to:     DME Arranged:    DME Agency:     HH Arranged:    HH Agency:     Status of Service:  In process, will continue to follow  Medicare Important Message Given:    Date Medicare IM Given:    Medicare IM give by:    Date Additional Medicare IM Given:    Additional Medicare Important Message give by:     If discussed at Long Length of Stay Meetings, dates discussed:    Additional Comments:  Kermit BaloKelli F Emmert Roethler, RN 11/23/2015, 1:52 PM

## 2015-11-23 NOTE — Evaluation (Addendum)
Physical Therapy Evaluation Patient Details Name: Ross Morgan MRN: [id number] DOB: Jun 23, 1928 Today's Date: 11/23/2015   History of Present Illness  is an 82 yo male [facility] with increased left sided weakness and dysarthria. PMH + for recent TIA. 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 CT scan negative and pt unable to have MRI.  Clinical Impression  Pt admitted with above diagnosis. Pt currently with functional limitations due to the deficits listed below (see PT Problem List). At the time of PT eval pt was able to perform transfers and ambulation with min guard assist for safety. [facility] and wishes to continue therapy there at d/c. Pt will benefit from skilled PT to increase their independence and safety with mobility to allow discharge to the venue listed below.      Follow Up Recommendations Outpatient PT;Supervision for mobility/OOB    Equipment Recommendations  None recommended by PT    Recommendations for Other Services       Precautions / Restrictions Precautions Precautions: Fall Restrictions Weight Bearing Restrictions: No      Mobility  Bed Mobility               General bed mobility comments: Pt sitting up on EOB when PT arrived.   Transfers Overall transfer level: Needs assistance Equipment used: None Transfers: Sit to/from Stand Sit to Stand: Min guard         General transfer comment: Increased time. Pt admits to being unsure of himself and being fearful of falling. HHA was offered once pt achieved full stand.   Ambulation/Gait Ambulation/Gait assistance: Min guard Ambulation Distance (Feet): 300 Feet Assistive device: 1 person hand held assist Gait Pattern/deviations: Step-through pattern;Decreased stride length;Trunk flexed Gait velocity: Decreased Gait velocity interpretation: Below normal speed for age/gender General Gait Details: Pt utilized HHA for 1/2 of  gait training and then was able to ambulate without assist for the last 1/2. Pt reports feeling his LLE is heavy and difficult to advance compared to the RLE. No significant unsteadiness noted.   Stairs            Wheelchair Mobility    Modified Rankin (Stroke Patients Only)       Balance Overall balance assessment: Needs assistance Sitting-balance support: Feet supported;No upper extremity supported Sitting balance-Leahy Scale: Fair     Standing balance support: No upper extremity supported;During functional activity Standing balance-Leahy Scale: Fair                               Pertinent Vitals/Pain Pain Assessment: No/denies pain    Home Living Family/patient expects to be discharged to:: Private residence Living Arrangements: Spouse/significant other Available Help at Discharge: Family (Pt is the caregiver for his wife with Parkinson's) Type of Home: House Home Access: Level entry     Home Layout: One level Home Equipment: Walker - 2 wheels;Cane - single point;Shower seat - built in Additional Comments: equipment belongs to wife who has dementia. Pt is primary caregiver for wife. 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 pt with periodic LOB or dizziness with extreme head tilts up or down but not consistent. Not reproducible on evaluation today     Hand Dominance  Dominant Hand: Right    Extremity/Trunk Assessment   Upper Extremity Assessment: Defer to OT evaluation           Lower Extremity Assessment: LLE deficits/detail   LLE Deficits / Details: With strength testing, noted very slight strength deficit in L hip flexors and quads only. After pt had been walking for a few minutes, noted began scuffing L foot on ground during swing through.   Cervical / Trunk Assessment: Kyphotic  Communication    Communication: No difficulties  Cognition Arousal/Alertness: Awake/alert Behavior During Therapy: WFL for tasks assessed/performed Overall Cognitive Status: Within Functional Limits for tasks assessed                      General Comments      Exercises        Assessment/Plan    PT Assessment Patient needs continued PT services  PT Diagnosis Difficulty walking   PT Problem List Decreased strength;Decreased range of motion;Decreased activity tolerance;Decreased balance;Decreased mobility;Decreased knowledge of use of DME;Decreased coordination;Decreased safety awareness;Decreased knowledge of precautions  PT Treatment Interventions DME instruction;Gait training;Stair training;Functional mobility training;Therapeutic activities;Therapeutic exercise;Neuromuscular re-education;Patient/family education   PT Goals (Current goals can be found in the Care Plan section) Acute Rehab PT Goals Patient Stated Goal: Return to PLOF PT Goal Formulation: With patient Time For Goal Achievement: 11/30/15 Potential to Achieve Goals: Good    Frequency Min 3X/week   Barriers to discharge        Co-evaluation               End of Session Equipment Utilized During Treatment: Gait belt Activity Tolerance: Patient tolerated treatment well Patient left: in chair;with call bell/phone within reach;with chair alarm set Nurse Communication: Mobility status         Time: 9528-4132 PT Time Calculation (min) (ACUTE ONLY): 28 min   Charges:   PT Evaluation $PT Eval Moderate Complexity: 1 Procedure PT Treatments $Gait Training: 8-22 mins   PT G Codes:        Conni Slipper 11/30/15, 1:44 PM   Conni Slipper, PT, DPT Acute Rehabilitation Services Pager: [phone]

## 2015-11-23 NOTE — Progress Notes (Signed)
ANTICOAGULATION CONSULT NOTE - Initial Consult  Pharmacy Consult for pradaxa Indication: atrial fibrillation  Allergies  Allergen Reactions  . Hctz [Hydrochlorothiazide] Other (See Comments)    Severe reaction  . Statins Other (See Comments)  . Zetia [Ezetimibe] Other (See Comments)    Patient Measurements: Height: 5\' 10"  (177.8 cm) Weight: 163 lb 3.2 oz (74.027 kg) IBW/kg (Calculated) : 73  Vital Signs: Temp: 97.9 F (36.6 C) (01/12 0800) Temp Source: Oral (01/12 0800) BP: 135/61 mmHg (01/12 0800) Pulse Rate: 69 (01/12 0800)  Labs:  Recent Labs  11/22/15 1656 11/22/15 1703 11/23/15 0517  HGB 13.3 14.6 11.9*  HCT 41.0 43.0 35.6*  PLT 144*  --  115*  APTT 30  --   --   LABPROT 15.7*  --   --   INR 1.23  --   --   CREATININE 1.17 1.20 1.04    Estimated Creatinine Clearance: 53.6 mL/min (by C-G formula based on Cr of 1.04).   Medical History: Past Medical History  Diagnosis Date  . Hypertension   . Hyperlipidemia   . Anal fissure   . Knee pain 2015    "tore tendon left knee; no OR"  . Bradycardia   . Atrial fibrillation (HCC)   . Type II diabetes mellitus (HCC)   . Arthritis     "left pointer" (05/30/2014)  . Depression   . Symptomatic bradycardia     status post dual AV permanent transvenous pacemaker insertion  . On continuous oral anticoagulation     Assessment: 87 yom on apixaban PTA for afib presented with acute stroke symptoms. Changing apixaban to pradaxa. H/H down slightly and platelets are low. No bleeding noted. Pt has good renal function.   Goal of Therapy:  Therapeutic anticoagulation Monitor platelets by anticoagulation protocol: Yes   Plan:  - Pradaxa 150mg  PO BID - F/u renal fxn, S&S of bleeding  Sharee Sturdy, Drake Leachachel Lynn 11/23/2015,10:51 AM

## 2015-11-23 NOTE — Evaluation (Signed)
Speech Language Pathology Evaluation Patient Details Name: Ross HoppingLeonard Morgan MRN: [id number] DOB: 1928/03/06 Today's Date: 11/23/2015 Time: [id number] SLP Time Calculation (min) (ACUTE ONLY): 42 min  Problem List:  Patient Active Problem List   Diagnosis Date Noted  . Left-sided weakness 11/22/2015  . Acute CVA (cerebrovascular accident) (HCC) 11/22/2015  . Stroke (cerebrum) (HCC) 11/19/2015  . Paroxysmal a-fib (HCC) 11/19/2015  . BPH (benign prostatic hyperplasia) 11/19/2015  . Cerebrovascular accident (CVA) (HCC)   . Diabetes mellitus with complication (HCC)   . Pacemaker 09/29/2014  . Type II diabetes mellitus (HCC)   . Atrial fibrillation, slow VR and pauses 05/30/2014  . Essential hypertension 04/29/2014  . Hyperlipidemia 04/29/2014  . Bradycardia, sinus 04/29/2014   Past Medical History:  Past Medical History  Diagnosis Date  . Hypertension   . Hyperlipidemia   . Anal fissure   . Knee pain 2015    "tore tendon left knee; no OR"  . Bradycardia   . Atrial fibrillation (HCC)   . Type II diabetes mellitus (HCC)   . Arthritis     "left pointer" (05/30/2014)  . Depression   . Symptomatic bradycardia     status post dual AV permanent transvenous pacemaker insertion  . On continuous oral anticoagulation    Past Surgical History:  Past Surgical History  Procedure Laterality Date  . Cataract extraction w/ intraocular lens  implant, bilateral Bilateral 2000's  . Pacemaker insertion  05/31/14    STJ dual chamber pacemaker implanted by Dr Ladona Ridgelaylor for symptomatic bradycardia  . Permanent pacemaker insertion N/A 05/31/2014    Procedure: PERMANENT PACEMAKER INSERTION;  Surgeon: Marinus MawGregg W Taylor, MD;  [facility];  Service: Cardiovascular;  Laterality: N/A;   HPI:  Ross Morgan is an 82 yo male [facility] with increased left sided weakness and dysarthria.  PMH + for recent TIA. 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  Speech eval  ordered. CT scan negative and pt unable to have MRI.     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  This occured mostly at multisyllabic and consonant cluster level.  With verbal, visual cues, slowing rate, overarticulating and appropriate pauses improve articulation.  Pt admits his wife has asked him to "enunciate" for years but currently speech is much worse than baseline.  Pt score on MOCA was 26/30 which is WNL with mild areas of difficulties including mental math and word recall 3/5 independently.  Strengths include fluency, orientation, and attention.  SLP follow up not indicated as pt educated to speech intelligibility strategies with teach back.  Also advised pt to record himself and listen to help improve his clarify given he is a public speaker and his articulation ability is important to him.  Thanks for this order, no follow up indicated.      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, primary caregiver for wife who has Parkinson's disease, [facility] Vocation: Retired   IT consultantCognition  Overall Cognitive Status: Within Functional Limits for tasks assessed Arousal/Alertness: Awake/alert Orientation Level: Oriented X4 Attention: Sustained Sustained Attention: Appears  intact Memory: Impaired Memory Impairment: Retrieval deficit (recalled 3/5 independently, 2/5 with category cues) Awareness: Appears intact Problem Solving: Appears intact Safety/Judgment: Appears intact    Comprehension  Auditory Comprehension Overall Auditory Comprehension: Appears within functional limits for tasks  assessed Yes/No Questions: Within Functional Limits Commands: Within Functional Limits Visual Recognition/Discrimination Discrimination: Within Function Limits Reading Comprehension Reading Status: Not tested    Expression Expression Primary Mode of Expression: Verbal Verbal Expression Overall Verbal Expression: Appears within functional limits for tasks assessed Initiation: No impairment Level of Generative/Spontaneous Verbalization: Conversation Repetition: No impairment Naming: No impairment Pragmatics: No impairment Written Expression Dominant Hand: Right Written Expression:  (pt completed clock drawing )   Oral / Motor Motor Speech Overall Motor Speech: Impaired Respiration: Within functional limits Resonance: Within functional limits Articulation: Within functional limitis Intelligibility: Intelligible Motor Planning: Witnin functional limits Motor Speech Errors: Not applicable Effective Techniques: Slow rate;Over-articulate;Pacing    Mills Koller, MS [facility] SLP [phone]

## 2015-11-23 NOTE — Discharge Instructions (Signed)

## 2015-11-24 ENCOUNTER — Telehealth: Payer: Self-pay | Admitting: Surgery

## 2015-11-24 DIAGNOSIS — E118 Type 2 diabetes mellitus with unspecified complications: Secondary | ICD-10-CM

## 2015-11-24 LAB — BASIC METABOLIC PANEL
ANION GAP: 8 (ref 5–15)
BUN: 16 mg/dL (ref 6–20)
CALCIUM: 8.6 mg/dL — AB (ref 8.9–10.3)
CHLORIDE: 106 mmol/L (ref 101–111)
CO2: 25 mmol/L (ref 22–32)
Creatinine, Ser: 1.15 mg/dL (ref 0.61–1.24)
GFR calc non Af Amer: 56 mL/min — ABNORMAL LOW (ref >60)
Glucose, Bld: 109 mg/dL — ABNORMAL HIGH (ref 65–99)
Potassium: 4 mmol/L (ref 3.5–5.1)
SODIUM: 139 mmol/L (ref 135–145)

## 2015-11-24 LAB — GLUCOSE, CAPILLARY
GLUCOSE-CAPILLARY: 109 mg/dL — AB (ref 65–99)
GLUCOSE-CAPILLARY: 122 mg/dL — AB (ref 65–99)

## 2015-11-24 LAB — HEMOGLOBIN A1C
Hgb A1c MFr Bld: 6.6 % — ABNORMAL HIGH (ref 4.8–5.6)
Mean Plasma Glucose: 143 mg/dL

## 2015-11-24 MED ORDER — VALSARTAN 160 MG PO TABS: 160.0000 mg | ORAL_TABLET | Freq: Every day | ORAL | Status: DC

## 2015-11-24 MED ORDER — DABIGATRAN ETEXILATE MESYLATE 150 MG PO CAPS: 150.0000 mg | ORAL_CAPSULE | Freq: Two times a day (BID) | ORAL | Status: DC

## 2015-11-24 NOTE — Discharge Summary (Signed)
Physician Discharge Summary  Ross Morgan UKG:[id number] DOB: Oct 28, 1928 DOA: 11/22/2015  PCP: Marton Redwood, MD  Admit date: 11/22/2015 Discharge date: 11/24/2015  Time spent: 65 minutes  Recommendations for Outpatient Follow-up:  1. Follow-up with neurology, as previously scheduled.   Discharge Diagnoses:  Principal Problem:   Acute CVA (cerebrovascular accident) [facility]) Active Problems:   Essential hypertension   Hyperlipidemia   Type II diabetes mellitus ([place])   Paroxysmal a-fib (HCC)   Left-sided weakness   Discharge Condition: Stable and improved.  Diet recommendation: Heart healthy.  Filed Weights   11/22/15 1958  Weight: 74.027 kg (163 lb 3.2 oz)    History of present illness:  Per Dr Marily Memos Patient family reported acute onset slurred speech and left facial droop which began yesterday morning while patient was reaching into his closet. This was associated with a brief episode of dizziness. Over the course the day patient's symptoms began to resolve. Patient awoke this morning when symptoms again came on acutely. At that point patient presented to [facility] emergency department. Patient denied any vision change, any dizziness with head movement, LOC, nausea, vomiting, chest pain, palpitations. Patient denied any falls or head trauma. Currently has headache. Patient reported compliance with his eliquis, has a pacemaker in place. Denied any history of A. fib though it is listed in his chart back several years. Nothing made his symptoms better or worse. Symptoms are constant but improving. Denied any recent URI or viral infections.  Hospital Course:  #1 Probable acute CVA Patient presented with slurred speech and left-sided weakness likely due to a small right subcortical infarct not visualized on head CT. Patient with a history of atrial fibrillation and despite being on anticoagulation still developed recurrent stroke. Patient with recent stroke workup. Unable to get MRI  of the head due to pacemaker. LDL was 71.PT/OT/ST. Neurology was consulted and followed the patient throughout the hospitalization. It was recommended per neurology to discontinue patient's eliquis.Patient was transitioned to Pradaxa per neurology for secondary stroke prevention.   #2 atrial fibrillation CHA2DS2VASC SCORE 8. Patient was rate controlled during the hospitalization. Eliquis has been changed to pradaxa per neurology. Outpatient follow-up. Follow.  #3 diabetes mellitus Patient was maintained on home regimen of medications.  #4 Hyperlipidemia LDL was 71. Continued on statin.  #5 Hypertension permissive hypertension. Antihypertensives were held. Antihypertensives will be resumed on discharge.   Procedures:  CT head without contrast 11/22/2015, 11/23/2015  Consultations:  neurology: Dr. Leonel Ramsay 11/22/2015  Discharge Exam: Filed Vitals:   11/24/15 0947 11/24/15 1336  BP: 143/69 130/71  Pulse: 69 69  Temp: 98.3 F (36.8 C) 98 F (36.7 C)  Resp: 18 18    General: NAD Cardiovascular: RRR Respiratory: CTAB  Discharge Instructions   Discharge Instructions    Diet Carb Modified    Complete by:  As directed      Discharge instructions    Complete by:  As directed   Follow up with neurology as scheduled.     Increase activity slowly    Complete by:  As directed           Current Discharge Medication List    START taking these medications   Details  dabigatran (PRADAXA) 150 MG CAPS capsule Take 1 capsule (150 mg total) by mouth every 12 (twelve) hours. Qty: 60 capsule, Refills: 2      CONTINUE these medications which have CHANGED   Details  valsartan (DIOVAN) 160 MG tablet Take 1 tablet (160 mg total) by mouth at  bedtime. Resume in 3-4 days.      CONTINUE these medications which have NOT CHANGED   Details  alfuzosin (UROXATRAL) 10 MG 24 hr tablet Take 10 mg by mouth every evening.    amLODipine (NORVASC) 10 MG tablet Take 10 mg by  mouth daily.    atorvastatin (LIPITOR) 20 MG tablet Take 20 mg by mouth at bedtime. Refills: 6   Associated Diagnoses: Hyperlipidemia    Calcium Carbonate-Simethicone (ALKA-SELTZER HEARTBURN + GAS) 750-80 MG CHEW Chew 1-2 each by mouth at bedtime.    citalopram (CELEXA) 10 MG tablet Take 10 mg by mouth at bedtime.     Coenzyme Q10 (CO Q-10) 200 MG CAPS Take 1 capsule by mouth daily.     fluorouracil (EFUDEX) 5 % cream Apply 1 application topically daily as needed (for irritation).    Incontinent Wash (BALNEOL) LOTN Apply 1 application topically daily as needed (for irritation).     lidocaine (XYLOCAINE) 5 % ointment Apply 1 application topically as needed. Qty: 35.44 g, Refills: 2   Associated Diagnoses: Anal pain    Lidocaine-Hydrocortisone Ace (ANAMANTLE HC) 3-0.5 % KIT Place 1 application rectally daily as needed (for irritation).     Multiple Vitamin (MULTIVITAMIN) capsule Take 1 capsule by mouth daily.    !! NON FORMULARY Place 1 drop into the left eye at bedtime. CVS lubricant eye drops left eye only    !! NON FORMULARY Place 1 application into the right eye at bedtime. Mura 5% eye ointment in right eye only    pimecrolimus (ELIDEL) 1 % cream Apply 1 application topically daily as needed (for face).     polyethylene glycol (MIRALAX / GLYCOLAX) packet Take 17 g by mouth every morning.    sitaGLIPtin-metformin (JANUMET) 50-1000 MG per tablet Take 1 tablet by mouth 2 (two) times daily with a meal.    vitamin C (ASCORBIC ACID) 500 MG tablet Take 500 mg by mouth daily.    zinc oxide (BALMEX) 11.3 % CREA cream Apply 1 application topically daily as needed (for rash).     !! - Potential duplicate medications found. Please discuss with provider.    STOP taking these medications     apixaban (ELIQUIS) 5 MG TABS tablet        Allergies  Allergen Reactions  . Hctz [Hydrochlorothiazide] Other (See Comments)    Severe reaction  . Statins Other (See Comments)  . Zetia  [Ezetimibe] Other (See Comments)   Follow-up Information    Follow up with Dennie Bible, NP On 12/06/2015.   Specialty:  Family Medicine   Why:  at 87 (already scheduled)   Contact information:   [address] [place] [address] [phone]        The results of significant diagnostics from this hospitalization (including imaging, microbiology, ancillary and laboratory) are listed below for reference.    Significant Diagnostic Studies: Ct Angio Head W/cm &/or Wo Cm  11/19/2015  CLINICAL DATA:  Stroke symptoms. Acute onset slurred speech and left facial droop yesterday morning. EXAM: CT ANGIOGRAPHY HEAD AND NECK TECHNIQUE: Multidetector CT imaging of the head and neck was performed using the standard protocol during bolus administration of intravenous contrast. Multiplanar CT image reconstructions and MIPs were obtained to evaluate the vascular anatomy. Carotid stenosis measurements (when applicable) are obtained utilizing NASCET criteria, using the distal internal carotid diameter as the denominator. CONTRAST:  181m OMNIPAQUE IOHEXOL 350 MG/ML SOLN COMPARISON:  CT head 11/19/2015 FINDINGS: CTA NECK Aortic arch: Aortic arch demonstrates  normal caliber and minimal atherosclerotic disease. Proximal great vessels widely patent. No dissection identified. Lung apices are clear. No mediastinal mass. Right carotid system: Right common carotid artery widely patent. Atherosclerotic plaque in the proximal right internal carotid artery. There is a prominent ulceration in the carotid bulb measuring 3 x 9 mm. This could be a source of emboli. 30% diameter stenosis right internal carotid artery. Right external carotid artery widely patent. No dissection. Left carotid system: Left common carotid artery widely patent. Mild atherosclerotic calcification at the carotid bifurcation without significant stenosis or dissection. 30% diameter stenosis left internal carotid artery Vertebral  arteries:Both vertebral arteries are patent to the basilar without significant stenosis or dissection. Skeleton: Mild cervical spine degenerative change. No fracture or bone lesion. Other neck: Negative for adenopathy in the neck. CTA HEAD Anterior circulation: Atherosclerotic calcification in the carotid siphon bilaterally with moderate stenosis of the distal cavernous carotid bilaterally. No cavernous carotid aneurysm. Anterior and middle cerebral arteries are patent bilaterally. Mild stenosis left M1 segment. Posterior circulation: Both vertebral arteries patent to the basilar. PICA patent. Basilar widely patent. Left AICA patent. Superior cerebellar arteries are patent bilaterally with moderate atherosclerotic disease. Atherosclerotic irregularity involving the proximal posterior cerebral artery bilaterally left greater than right. Moderate to severe stenosis on the left and moderate stenosis on the right. Venous sinuses: Patent Anatomic variants: Negative for cerebral aneurysm or vascular malformation. Delayed phase: Normal enhancement on delayed imaging. No enhancing mass lesion. IMPRESSION: Atrophy and chronic microvascular ischemia. No acute intracranial abnormality. Prominent ulceration of the right carotid bulb which may be a source of emboli. No significant stenosis of the carotid or vertebral artery stenosis in the neck. Moderate stenosis of the cavernous carotid bilaterally. Mild stenosis left M1 segment Moderate to severe stenosis left P1 segment moderate stenosis right P1 segment Critical Value/emergent results were called by telephone at the time of interpretation on 11/19/2015 at 7:24 pm to Dr. Armida Sans , who verbally acknowledged these results. Electronically Signed   By: Franchot Gallo M.D.   On: 11/19/2015 19:24   Ct Head Wo Contrast  11/23/2015  CLINICAL DATA:  Followup of acute CVA. EXAM: CT HEAD WITHOUT CONTRAST TECHNIQUE: Contiguous axial images were obtained from the base of the skull through  the vertex without intravenous contrast. COMPARISON:  Multiple priors, including 1 day prior. FINDINGS: Sinuses/Soft tissues: Clear paranasal sinuses and mastoid air cells. Intracranial: Expected cerebral volume loss for age. Mild for age low density in the periventricular white matter likely related to small vessel disease. No mass lesion, hemorrhage, hydrocephalus, acute infarct, intra-axial, or extra-axial fluid collection. IMPRESSION: 1. Normal head CT for age. 2. Expected cerebral volume loss and relatively mild small vessel ischemic change. Electronically Signed   By: Abigail Miyamoto M.D.   On: 11/23/2015 08:19   Ct Head Wo Contrast  11/22/2015  CLINICAL DATA:  Cold stroke left-sided facial droop slurred speech EXAM: CT HEAD WITHOUT CONTRAST TECHNIQUE: Contiguous axial images were obtained from the base of the skull through the vertex without intravenous contrast. COMPARISON:  11/19/2015 FINDINGS: There is no evidence of hemorrhage or extra-axial fluid. No hydrocephalus identified. There is diffuse cortical atrophy, stable. There is mild low attenuation in the periventricular white matter which is unchanged as well. Evidence of a tiny lacunar infarct in the basal ganglia region on the left is stable. There is no evidence of vascular territory infarct. Calvarium is intact. IMPRESSION: Age-related atrophy and white matter change with no acute findings. Critical Value/emergent results were called by  telephone at the time of interpretation on 11/22/2015 at 5:16 pm to Dr. Amada Jupiter, who verbally acknowledged these results. Electronically Signed   By: Esperanza Heir M.D.   On: 11/22/2015 17:16   Ct Head Wo Contrast  11/19/2015  CLINICAL DATA:  Slurred speech.  Facial droop. EXAM: CT HEAD WITHOUT CONTRAST TECHNIQUE: Contiguous axial images were obtained from the base of the skull through the vertex without intravenous contrast. COMPARISON:  07/08/2008 FINDINGS: There is low attenuation throughout the subcortical  and periventricular white matter compatible with chronic microvascular disease. Prominence of the sulci and ventricles noted consistent with brain atrophy. The paranasal sinuses are clear. The mastoid air cells are clear. The calvarium is intact. IMPRESSION: 1. No acute intracranial abnormalities. 2. Chronic micro vascular disease and brain atrophy. Electronically Signed   By: Signa Kell M.D.   On: 11/19/2015 14:19   Ct Angio Neck W/cm &/or Wo/cm  11/19/2015  CLINICAL DATA:  Stroke symptoms. Acute onset slurred speech and left facial droop yesterday morning. EXAM: CT ANGIOGRAPHY HEAD AND NECK TECHNIQUE: Multidetector CT imaging of the head and neck was performed using the standard protocol during bolus administration of intravenous contrast. Multiplanar CT image reconstructions and MIPs were obtained to evaluate the vascular anatomy. Carotid stenosis measurements (when applicable) are obtained utilizing NASCET criteria, using the distal internal carotid diameter as the denominator. CONTRAST:  OMNIPAQUE IOHEXOL 350 MG/ML SOLN COMPARISON:  CT head 11/19/2015 FINDINGS: CTA NECK Aortic arch: Aortic arch demonstrates normal caliber and minimal atherosclerotic disease. Proximal great vessels widely patent. No dissection identified. Lung apices are clear. No mediastinal mass. Right carotid system: Right common carotid artery widely patent. Atherosclerotic plaque in the proximal right internal carotid artery. There is a prominent ulceration in the carotid bulb measuring 3 x 9 mm. This could be a source of emboli. 30% diameter stenosis right internal carotid artery. Right external carotid artery widely patent. No dissection. Left carotid system: Left common carotid artery widely patent. Mild atherosclerotic calcification at the carotid bifurcation without significant stenosis or dissection. 30% diameter stenosis left internal carotid artery Vertebral arteries:Both vertebral arteries are patent to the basilar  without significant stenosis or dissection. Skeleton: Mild cervical spine degenerative change. No fracture or bone lesion. Other neck: Negative for adenopathy in the neck. CTA HEAD Anterior circulation: Atherosclerotic calcification in the carotid siphon bilaterally with moderate stenosis of the distal cavernous carotid bilaterally. No cavernous carotid aneurysm. Anterior and middle cerebral arteries are patent bilaterally. Mild stenosis left M1 segment. Posterior circulation: Both vertebral arteries patent to the basilar. PICA patent. Basilar widely patent. Left AICA patent. Superior cerebellar arteries are patent bilaterally with moderate atherosclerotic disease. Atherosclerotic irregularity involving the proximal posterior cerebral artery bilaterally left greater than right. Moderate to severe stenosis on the left and moderate stenosis on the right. Venous sinuses: Patent Anatomic variants: Negative for cerebral aneurysm or vascular malformation. Delayed phase: Normal enhancement on delayed imaging. No enhancing mass lesion. IMPRESSION: Atrophy and chronic microvascular ischemia. No acute intracranial abnormality. Prominent ulceration of the right carotid bulb which may be a source of emboli. No significant stenosis of the carotid or vertebral artery stenosis in the neck. Moderate stenosis of the cavernous carotid bilaterally. Mild stenosis left M1 segment Moderate to severe stenosis left P1 segment moderate stenosis right P1 segment Critical Value/emergent results were called by telephone at the time of interpretation on 11/19/2015 at 7:24 pm to Dr. Leroy Kennedy , who verbally acknowledged these results. Electronically Signed   By: Marlan Palau M.D.  On: 11/19/2015 19:24   Ct Cerebral Perfusion W/cm  11/19/2015  CLINICAL DATA:  Stroke symptoms. Slurred speech and left facial droop began yesterday. EXAM: CT CEREBRAL PERFUSION WITH CONTRAST TECHNIQUE: CT perfusion performed through the basal ganglia and mid cerebral  hemispheres with calculation of cerebral blood volume, mean transit time, and time to peak perfusion, and cerebral blood flow. CONTRAST:  17m OMNIPAQUE IOHEXOL 350 MG/ML SOLN COMPARISON:  CT head 11/19/2015 FINDINGS: Negative for acute infarct. Cerebral blood volume is symmetric without evidence of core infarct. There is a subtle finding of increased mean transit time in the right basal ganglia. This also shows slight increase in time to peak perfusion and is suggestive of mild ischemia. IMPRESSION: Negative for acute infarct. Subtle finding of increased mean transit time right basal ganglia suggestive of ischemia. Correlate with MRI. Critical Value/emergent results were called by telephone at the time of interpretation on 11/19/2015 at 7:25 pm to Dr. CArmida Sans, who verbally acknowledged these results. Electronically Signed   By: CFranchot GalloM.D.   On: 11/19/2015 19:25    Microbiology: No results found for this or any previous visit (from the past 240 hour(s)).   Labs: Basic Metabolic Panel:  Recent Labs Lab 11/19/15 1336  11/20/15 0632 11/22/15 1656 11/22/15 1703 11/23/15 0517 11/24/15 0548  NA 139  < > 139 140 140 141 139  K 4.8  < > 4.3 4.5 4.3 4.0 4.0  CL 106  < > 105 106 102 109 106  CO2 26  --  23 25  --  26 25  GLUCOSE 110*  < > 120* 184* 179* 112* 109*  BUN 20  < > 15 21* 24* 15 16  CREATININE 1.27*  < > 1.03 1.17 1.20 1.04 1.15  CALCIUM 9.4  --  9.1 9.4  --  8.6* 8.6*  < > = values in this interval not displayed. Liver Function Tests:  Recent Labs Lab 11/19/15 1336 11/20/15 0632 11/22/15 1656  AST '21 21 22  '$ ALT 16* 17 19  ALKPHOS 81 79 78  BILITOT 0.7 1.0 0.7  PROT 6.9 6.8 7.0  ALBUMIN 3.8 3.7 3.8   No results for input(s): LIPASE, AMYLASE in the last 168 hours. No results for input(s): AMMONIA in the last 168 hours. CBC:  Recent Labs Lab 11/19/15 1336 11/19/15 1345 11/20/15 0632 11/22/15 1656 11/22/15 1703 11/23/15 0517  WBC 5.5  --  6.4 6.8  --  6.6   NEUTROABS 3.3  --   --  3.9  --   --   HGB 12.8* 14.3 13.5 13.3 14.6 11.9*  HCT 39.7 42.0 41.9 41.0 43.0 35.6*  MCV 99.5  --  99.1 99.3  --  98.9  PLT 138*  --  142* 144*  --  115*   Cardiac Enzymes: No results for input(s): CKTOTAL, CKMB, CKMBINDEX, TROPONINI in the last 168 hours. BNP: BNP (last 3 results) No results for input(s): BNP in the last 8760 hours.  ProBNP (last 3 results) No results for input(s): PROBNP in the last 8760 hours.  CBG:  Recent Labs Lab 11/23/15 1138 11/23/15 1638 11/23/15 2105 11/24/15 0634 11/24/15 1149  GLUCAP 153* 137* 138* 109* 122*       Signed:  Matie Dimaano MD.  [facility] 11/24/2015, 3:27 PM

## 2015-11-24 NOTE — Progress Notes (Signed)
STROKE TEAM PROGRESS NOTE   HISTORY Ross Morgan is a 82 y.o. male with a history of recent TIA discharged on 11/19/15. He noticed after getting out of a hot bath last night that he noticed that he had difficulty getting out of the bath. He then noticed thathe got worse this mornign and went to see his PCP. His PCP advised him to return to the ER given left sided weakness and facial droop. He was LKW 11/21/2015. Patient was not administered TPA secondary to out of window, on eliquis. He was admitted for further evaluation and treatment.   SUBJECTIVE (INTERVAL HISTORY) He is eager to go home  Overall he feels his condition is stable since yesterday.    OBJECTIVE Temp:  [97.9 F (36.6 C)-98.6 F (37 C)] 98 F (36.7 C) (01/13 1336) Pulse Rate:  [69-72] 69 (01/13 1336) Cardiac Rhythm:  [-] Ventricular paced (01/13 0700) Resp:  [18-20] 18 (01/13 1336) BP: (114-152)/(63-75) 130/71 mmHg (01/13 1336) SpO2:  [94 %-100 %] 100 % (01/13 1336)  CBC:  Recent Labs Lab 11/19/15 1336  11/22/15 1656 11/22/15 1703 11/23/15 0517  WBC 5.5  < > 6.8  --  6.6  NEUTROABS 3.3  --  3.9  --   --   HGB 12.8*  < > 13.3 14.6 11.9*  HCT 39.7  < > 41.0 43.0 35.6*  MCV 99.5  < > 99.3  --  98.9  PLT 138*  < > 144*  --  115*  < > = values in this interval not displayed.  Basic Metabolic Panel:   Recent Labs Lab 11/23/15 0517 11/24/15 0548  NA 141 139  K 4.0 4.0  CL 109 106  CO2 26 25  GLUCOSE 112* 109*  BUN 15 16  CREATININE 1.04 1.15  CALCIUM 8.6* 8.6*    Lipid Panel:     Component Value Date/Time   CHOL 109 11/20/2015 0632   TRIG 52 11/20/2015 0632   HDL 28* 11/20/2015 0632   CHOLHDL 3.9 11/20/2015 0632   VLDL 10 11/20/2015 0632   LDLCALC 71 11/20/2015 0632   HgbA1c:  Lab Results  Component Value Date   HGBA1C 6.6* 11/23/2015   Urine Drug Screen: No results found for: LABOPIA, COCAINSCRNUR, LABBENZ, AMPHETMU, THCU, LABBARB    IMAGING  Ct Head Wo Contrast 11/23/2015  1. Normal  head CT for age. 2. Expected cerebral volume loss and relatively mild small vessel ischemic change.   Ct Head Wo Contrast 11/22/2015   Age-related atrophy and white matter change with no acute findings.   PHYSICAL EXAM Pleasant elderly Caucasian male currently not in distress. . Afebrile. Head is nontraumatic. Neck is supple without bruit.    Cardiac exam no murmur or gallop. Lungs are clear to auscultation. Distal pulses are well felt. Neurological Exam :  Awake alert oriented x 3 normal speech and language. Mild left lower face asymmetry. Tongue midline. No drift. Mild diminished fine finger movements on left. Orbits right over left upper extremity. Mild left grip weak.. Normal sensation . Normal coordination. ASSESSMENT/PLAN [name] is a 82 y.o. male with history of AF on anticoagulation, HTN, DM, HLD, recent TIA presenting with slurred speech, L foot drop, L facial. He did not receive IV t-PA due to out of window, on eliquis.   Stroke:  Non-dominant right small infarct too small to be seen on imaging. Infarct felt to be embolic secondary to known atrial fibrillation   Resultant  Dysarthria, L facial, mild distal L hand  weakness (decreased FMM)  MRI  / MRA  pacer  ecent CT head and neck with no acute abnormality  2-D echo EF 45-50% ( down from previous 60^, found during last admission, OP evaluation  by cardiology planned)  LDL 71  HgbA1c ordered  eliquis for VTE prophylaxis Diet Heart Room service appropriate?: Yes; Fluid consistency:: Thin Diet Carb Modified  Eliquis (apixaban) daily prior to admission, Eliquis on hold for first hospital day and aspirin given, change to Pradaxa (dabigatran) twice a day. Will ask pharmacy to transition (there is no research to support this change, however, OACs are not 100% effective, given previous TIA and now stroke, seems prudent to change)  Patient counseled to be compliant with his antithrombotic medications  Ongoing aggressive  stroke risk factor management  Therapy recommendations:  pending   Disposition:  pending   Atrial Fibrillation  Home anticoagulation:  eliquis continued in the hospital  CHA2DS2-VASc score was 8.  Change to pradaxa. Pharmacy consulted    Hypertension  Stable  Permissive hypertension (OK if < 220/120) but gradually normalize in 5-7 days  Hyperlipidemia  Home meds:  Lipitor 20, resumed in hospital  LDL 71, goal < 70  Continue statin at discharge  Diabetes type II  HgbA1c pending, goal < 7.0  Other Stroke Risk Factors  Advanced age  Former Cigarette smoker  ETOH use  Hx stroke/TIA -   11/2015 TIA w/ vertigo that resolved  Hospital day # 2  SETHI,PRAMOD  [facility] See Amion for Pager information 11/24/2015 5:03 PM  I have personally examined this patient, reviewed notes, independently viewed imaging studies, participated in medical decision making and plan of care. I have made any additions or clarifications directly to the above note. Agree with note above.  He presented with left-sided weakness likely due to small right subcortical infarct not visualized on CT scan.  I had a long discussion with the patient with regards to lack of evidence suggesting switching from one new anticoagulant to another his not necessarily superior. However since he is had 2 strokes now on eliquis I recommend changing over to Pradaxa for secondary stroke prevention. He is in agreement with the plan. Stroke service will sign off. Follow-up as an outpatient in the stroke clinic in 2 months.  Delia HeadyPramod Sethi, MD Medical Director [facility] Pager: [phone] 11/24/2015 5:03 PM    To contact Stroke Continuity provider, please refer to [email or web address]. After hours, contact General Neurology

## 2015-11-24 NOTE — Progress Notes (Signed)
Physical Therapy Treatment and Discharge Patient Details Name: Ross Morgan MRN: [id number] DOB: [birth year] Today's Date: 11/24/2015    History of Present Illness 82 y.o. male admitted to [facility] with increased left sided weakness and dysarthria. PMH + for recent TIA. 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 CT scan negative and pt unable to have MRI.    PT Comments    At the time of PT session pt was able to perform transfers and ambulation with modified independence. Pt ambulated 700 feet with no assist required. Occasional L foot scuffing noted during swing through which increased in frequency with fatigue. At the end of gait training vitals were stable with oxygen saturation at 97% and HR at 116bpm. Goals have been met and all education has been provided. Pt reports no further questions. Will sign off at this time. Please reconsult if needs change.    Follow Up Recommendations  Outpatient PT;Supervision for mobility/OOB     Equipment Recommendations  None recommended by PT    Recommendations for Other Services       Precautions / Restrictions Precautions Precautions: None Restrictions Weight Bearing Restrictions: No    Mobility  Bed Mobility               General bed mobility comments: Pt received sitting up in recliner chair.   Transfers Overall transfer level: Modified independent Equipment used: None Transfers: Sit to/from Stand Sit to Stand: Modified independent (Device/Increase time)         General transfer comment: Pt reports L foot is dragging when walking  Ambulation/Gait Ambulation/Gait assistance: Modified independent (Device/Increase time) Ambulation Distance (Feet): 700 Feet Assistive device: None Gait Pattern/deviations: Step-through pattern (decreased floor clearance on L) Gait velocity: Decreased Gait velocity interpretation: Below normal speed for age/gender General Gait Details: As pt fatigued,  L foot occasionally scuffed floor during swing through. Pt took 1 standing break to rest LLE to see if he could decrease scuffing on the way back to the room.    Stairs            Wheelchair Mobility    Modified Rankin (Stroke Patients Only)       Balance Overall balance assessment: No apparent balance deficits (not formally assessed) Sitting-balance support: Feet supported;No upper extremity supported Sitting balance-Leahy Scale: Normal     Standing balance support: No upper extremity supported;During functional activity Standing balance-Leahy Scale: Fair                      Cognition Arousal/Alertness: Awake/alert Behavior During Therapy: WFL for tasks assessed/performed Overall Cognitive Status: Within Functional Limits for tasks assessed                      Exercises      General Comments        Pertinent Vitals/Pain Pain Assessment: No/denies pain    Home Living Family/patient expects to be discharged to:: Private residence Living Arrangements: Spouse/significant other Available Help at Discharge: Family;Available PRN/intermittently Type of Home: House Home Access: Level entry   Home Layout: One level Home Equipment: Walker - 2 wheels;Walker - 4 wheels;Cane - single point;Shower seat - built in;Grab bars - toilet;Grab bars - tub/shower;Adaptive equipment Additional Comments: 2 RW, 1 4ww, 5 SPC, and 2 reachers all belonging to pt's wife who has PARKINSON'S (not dementia). Pt is primary caregiver for wife and reports it is very stressful. Pt's wife is mod I  with basic ADLs, pt completes all driving, cooking, and dusting - they have a cleaning lady and will be hiring an aid to assist pt take care of wife physically.    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   PT Goals (current goals can now be found in the care plan  section) Acute Rehab PT Goals Patient Stated Goal: Return to PLOF PT Goal Formulation: With patient Time For Goal Achievement: 11/30/15 Potential to Achieve Goals: Good Progress towards PT goals: Goals met/education completed, patient discharged from PT    Frequency  Min 3X/week    PT Plan Current plan remains appropriate    Co-evaluation             End of Session Equipment Utilized During Treatment: Gait belt Activity Tolerance: Patient tolerated treatment well Patient left: in chair;with call bell/phone within reach     Time: 1330-1347 PT Time Calculation (min) (ACUTE ONLY): 17 min  Charges:  $Gait Training: 8-22 mins                    G Codes:      Ross Morgan 11/29/15, 1:56 PM   Ross Morgan, PT, DPT Acute Rehabilitation Services Pager: [phone]

## 2015-11-24 NOTE — Care Management Important Message (Signed)
Important Message  Patient Details  Name: Ross Morgan MRN: [id number] Date of Birth: August 06, 1928   Medicare Important Message Given:  Yes    Oralia RudMegan P Kim Oki 11/24/2015, 3:14 PM

## 2015-11-24 NOTE — Progress Notes (Signed)
Occupational Therapy Evaluation Patient Details Name: Ross Morgan MRN: [id number] DOB: 07/27/1928 Today's Date: 11/24/2015    History of Present Illness 82 y.o. male admitted to [facility] with increased left sided weakness and dysarthria. PMH + for recent TIA. 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 CT scan negative and pt unable to have MRI.   Clinical Impression   PTA, pt was independent with all ADLs, IADLs and mobility. Pt appears to be functioning at or near his baseline. Educated pt on gradually increasing activity level at home upon d/c and fall prevention strategies. All education has been completed and pt has no further questions. Agreed with pt that he will not need OT follow up and pt has no further acute OT needs. OT signing off. Thank you for this referral.    Follow Up Recommendations  No OT follow up;Supervision - Intermittent    Equipment Recommendations  None recommended by OT    Recommendations for Other Services       Precautions / Restrictions Precautions Precautions: None Restrictions Weight Bearing Restrictions: No      Mobility Bed Mobility               General bed mobility comments: Pt ambulating in hallway independently on OT arrival  Transfers Overall transfer level: Modified independent Equipment used: None Transfers: Sit to/from Stand Sit to Stand: Modified independent (Device/Increase time)         General transfer comment: Pt reports L foot is dragging when walking    Balance Overall balance assessment: Needs assistance Sitting-balance support: No upper extremity supported;Feet supported Sitting balance-Leahy Scale: Normal     Standing balance support: No upper extremity supported;During functional activity Standing balance-Leahy Scale: Fair                              ADL Overall ADL's : At baseline;Modified independent;Independent                                        General ADL Comments: independent to mod I, at baseline     Vision Vision Assessment?: No apparent visual deficits   Perception     Praxis      Pertinent Vitals/Pain Pain Assessment: No/denies pain     Hand Dominance Right   Extremity/Trunk Assessment Upper Extremity Assessment Upper Extremity Assessment: Overall WFL for tasks assessed (all 5/5 and ROM wfl)   Lower Extremity Assessment Lower Extremity Assessment: Defer to PT evaluation   Cervical / Trunk Assessment Cervical / Trunk Assessment: Kyphotic   Communication Communication Communication: No difficulties   Cognition Arousal/Alertness: Awake/alert Behavior During Therapy: WFL for tasks assessed/performed Overall Cognitive Status: Within Functional Limits for tasks assessed                     General Comments       Exercises       Shoulder Instructions      Home Living Family/patient expects to be discharged to:: Private residence Living Arrangements: Spouse/significant other Available Help at Discharge: Family;Available PRN/intermittently Type of Home: House Home Access: Level entry     Home Layout: One level     Bathroom Shower/Tub: Walk-in shower;Door;Tub only   Firefighter: Handicapped height Bathroom Accessibility: Yes How Accessible: Accessible via walker Home Equipment: Walker - 2  wheels;Walker - 4 wheels;Cane - single point;Shower seat - built in;Grab bars - toilet;Grab bars - tub/shower;Adaptive equipment Adaptive Equipment: Reacher Additional Comments: 2 RW, 1 4ww, 5 SPC, and 2 reachers all belonging to pt's wife who has PARKINSON'S (not dementia). Pt is primary caregiver for wife and reports it is very stressful. Pt's wife is mod I with basic ADLs, pt completes all driving, cooking, and dusting - they have a cleaning lady and will be hiring an aid to assist pt take care of wife physically.  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    OT Diagnosis:     OT Problem List: Decreased activity tolerance;Impaired balance (sitting and/or standing)   OT Treatment/Interventions:      OT Goals(Current goals can be found in the care plan section) Acute Rehab OT Goals Patient Stated Goal: Return to PLOF OT Goal Formulation: All assessment and education complete, DC therapy  OT Frequency:     Barriers to D/C:            Co-evaluation              End of Session Nurse Communication: Mobility status  Activity Tolerance: Patient tolerated treatment well Patient left: Other (comment) (ambulating in hallway (cleared by RN))   Time: [id number] OT Time Calculation (min): 21 min Charges:  OT General Charges $OT Visit: 1 Procedure OT Evaluation $OT Eval Low Complexity: 1 Procedure G-Codes:    Nils PyleJulia Tzipporah Nagorski, OTR/L Pager: [phone]: [phone] 11/24/2015, 11:42 AM

## 2015-11-24 NOTE — Care Management Note (Signed)
Case Management Note  Patient Details  Name: Ross Morgan MRN: [id number] Date of Birth: 02-Sep-1928  Subjective/Objective:                    Action/Plan: Benefits check for patients pradaxa back at $20/ month. CM informed patient and he was agreeable to this. Bedside RN updated.   Expected Discharge Date:                  Expected Discharge Plan:  Home/Self Care  In-House Referral:     Discharge planning Services  CM Consult  Post Acute Care Choice:    Choice offered to:     DME Arranged:    DME Agency:     HH Arranged:    HH Agency:     Status of Service:  Completed, signed off  Medicare Important Message Given:  Yes Date Medicare IM Given:    Medicare IM give by:    Date Additional Medicare IM Given:    Additional Medicare Important Message give by:     If discussed at Long Length of Stay Meetings, dates discussed:    Additional Comments:  Kermit BaloKelli F Hadassah Rana, RN 11/24/2015, 4:24 PM

## 2015-11-24 NOTE — Progress Notes (Signed)
Discharge orders received. Pt educated on discharge instructions and stroke education. Pt verbalized understanding. Pt given discharge packet and prescription. IV and tele removed. Pt refused wheelchair. Pt accompanied downstairs by family.

## 2015-11-24 NOTE — Care Management Note (Signed)
Case Management Note  Patient Details  Name: Ross Morgan MRN: [id number] Date of Birth: [birth year]  Subjective/Objective:                    Action/Plan: Plan is for patient to discharge home to [facility] with his wife. MD ordered for outpatient PT/ ST. CM spoke with Ross Morgan and [facility] and [facility] are [facility] to provide these services. Order for outpatient PT/ST faxed to [facility] at the number provided [phone]). Bedside RN updated.   Expected Discharge Date:                  Expected Discharge Plan:  Home/Self Care  In-House Referral:     Discharge planning Services  CM Consult  Post Acute Care Choice:    Choice offered to:     DME Arranged:    DME Agency:     HH Arranged:    HH Agency:     Status of Service:  Completed, signed off  Medicare Important Message Given:    Date Medicare IM Given:    Medicare IM give by:    Date Additional Medicare IM Given:    Additional Medicare Important Message give by:     If discussed at Long Length of Stay Meetings, dates discussed:    Additional Comments:  Kermit BaloKelli F Eriel Doyon, RN 11/24/2015, 2:44 PM

## 2015-11-24 NOTE — Telephone Encounter (Signed)
ED CM received [facility] from [facility] regarding receiving call from  Patient at pharmacy not being able to afford Pradaxa. CM called [facility] on [address], provided pradaxa  savings card information. Information was processed, and patient informed that medication is ready for pick up for $20 co-pay. Teach back done patient verbalized understanding.  No further CM needs identified.

## 2015-11-25 LAB — HEMOGLOBIN A1C
Hgb A1c MFr Bld: 6.8 % — ABNORMAL HIGH (ref 4.8–5.6)
Mean Plasma Glucose: 148 mg/dL

## 2015-12-06 ENCOUNTER — Encounter: Payer: Self-pay | Admitting: Nurse Practitioner

## 2015-12-06 ENCOUNTER — Ambulatory Visit: Payer: Self-pay | Admitting: Nurse Practitioner

## 2015-12-06 ENCOUNTER — Ambulatory Visit (INDEPENDENT_AMBULATORY_CARE_PROVIDER_SITE_OTHER): Payer: Medicare Other | Admitting: Nurse Practitioner

## 2015-12-06 VITALS — BP 128/63 | HR 73 | Ht 70.0 in | Wt 174.2 lb

## 2015-12-06 DIAGNOSIS — E118 Type 2 diabetes mellitus with unspecified complications: Secondary | ICD-10-CM | POA: Diagnosis not present

## 2015-12-06 DIAGNOSIS — I480 Paroxysmal atrial fibrillation: Secondary | ICD-10-CM | POA: Diagnosis not present

## 2015-12-06 DIAGNOSIS — M6289 Other specified disorders of muscle: Secondary | ICD-10-CM

## 2015-12-06 DIAGNOSIS — E785 Hyperlipidemia, unspecified: Secondary | ICD-10-CM

## 2015-12-06 DIAGNOSIS — I10 Essential (primary) hypertension: Secondary | ICD-10-CM

## 2015-12-06 DIAGNOSIS — R531 Weakness: Secondary | ICD-10-CM

## 2015-12-06 DIAGNOSIS — I639 Cerebral infarction, unspecified: Secondary | ICD-10-CM

## 2015-12-06 NOTE — Patient Instructions (Addendum)
Control of stroke risk factors Keep blood pressure less than 130 systolic today's [id number] continue hypertensive medications Continue Pradaxa for secondary stroke prevention and atrial fibrillation Hemoglobin A1c less than 7 continue diabetes medications LDL less than 70 remain on statin Continue  physical therapy 3 times a week Increase activity slowly Follow-up in 3 months

## 2015-12-06 NOTE — Progress Notes (Signed)
I reviewed above note and agree with the assessment and plan.  Marvel Plan, MD PhD Stroke Neurology 12/06/2015 6:13 PM

## 2015-12-06 NOTE — Progress Notes (Signed)
[facility]  PATIENT: Ross Morgan DOB: [birth year]   REASON FOR VISIT:hospital follow-up for stroke with multiple risk factors HISTORY FROM:patient and wife    HISTORY OF PRESENT ILLNESS:Ross Morgan, 82 year old male returns for hospital follow-up. He was admitted on 11/22/15  and discharged on 11/24/15. He was admitted with slurred speech, left facial droop and left foot drop. He did not receive TPA due to being out of the time window. Review of hospital records.Stroke with nondominant right small infarct too small to be seen on imaging felt to be secondary to known atrial fibrillation. He has permanent pacemaker. 2-D echo ejection fraction 45-50% down from previous 60%. LDL was 71 hemoglobin A1c6.8. For his atrial fibrillation he was switched to Pradaxa and told to continue his statin. He is currently receiving in-home physical therapy 3 times a week and his left-sided symptoms are improving. He is not driving. He is using a walker in the home, he returns for reevaluation.   REVIEW OF SYSTEMS: Full 14 system review of systems performed and notable only for those listed, all others are neg:  Constitutional: neg  Cardiovascular: neg Ear/Nose/Throat: neg  Skin: neg Eyes: neg Respiratory: neg Gastroitestinal: neg  Hematology/Lymphatic: neg  Endocrine: neg Musculoskeletal:neg Allergy/Immunology: neg Neurological: each difficulty weakness facial droop Psychiatric: depression Sleep : neg   ALLERGIES: Allergies  Allergen Reactions  . Hctz [Hydrochlorothiazide] Other (See Comments)    Severe reaction  . Statins Other (See Comments)  . Zetia [Ezetimibe] Other (See Comments)    HOME MEDICATIONS: Outpatient Prescriptions Prior to Visit  Medication Sig Dispense Refill  . alfuzosin (UROXATRAL) 10 MG 24 hr tablet Take 10 mg by mouth every evening.    [name] amLODipine (NORVASC) 10 MG tablet Take 10 mg by mouth daily.    [name] atorvastatin (LIPITOR) 20 MG tablet Take 20 mg  by mouth at bedtime.  6  . Calcium Carbonate-Simethicone (ALKA-SELTZER HEARTBURN + GAS) 750-80 MG CHEW Chew 1-2 each by mouth at bedtime.    . citalopram (CELEXA) 10 MG tablet Take 10 mg by mouth at bedtime.     . Coenzyme Q10 (CO Q-10) 200 MG CAPS Take 1 capsule by mouth daily.     . dabigatran (PRADAXA) 150 MG CAPS capsule Take 1 capsule (150 mg total) by mouth every 12 (twelve) hours. 60 capsule 2  . fluorouracil (EFUDEX) 5 % cream Apply 1 application topically daily as needed (for irritation).    . Incontinent Wash (BALNEOL) LOTN Apply 1 application topically daily as needed (for irritation).     [name] lidocaine (XYLOCAINE) 5 % ointment Apply 1 application topically as needed. (Patient taking differently: Apply 1 application topically as needed for moderate pain (pain). ) 35.44 g 2  . Lidocaine-Hydrocortisone Ace (ANAMANTLE HC) 3-0.5 % KIT Place 1 application rectally daily as needed (for irritation).     . Multiple Vitamin (MULTIVITAMIN) capsule Take 1 capsule by mouth daily.    . NON FORMULARY Place 1 drop into the left eye at bedtime. [employer] lubricant eye drops left eye only    . NON FORMULARY Place 1 application into the right eye at bedtime. Mura 5% eye ointment in right eye only    . pimecrolimus (ELIDEL) 1 % cream Apply 1 application topically daily as needed (for face).     . polyethylene glycol (MIRALAX / GLYCOLAX) packet Take 17 g by mouth every morning.    . sitaGLIPtin-metformin (JANUMET) 50-1000 MG per tablet Take 1 tablet by mouth 2 (two) times daily with  a meal.    . valsartan (DIOVAN) 160 MG tablet Take 1 tablet (160 mg total) by mouth at bedtime. Resume in 3-4 days.    . vitamin C (ASCORBIC ACID) 500 MG tablet Take 500 mg by mouth daily.    [name] zinc oxide (BALMEX) 11.3 % CREA cream Apply 1 application topically daily as needed (for rash).     No facility-administered medications prior to visit.    PAST MEDICAL HISTORY: Past Medical History  Diagnosis Date  . Hypertension   .  Hyperlipidemia   . Anal fissure   . Knee pain 2015    "tore tendon left knee; no OR"  . Bradycardia   . Atrial fibrillation ([place])   . Type II diabetes mellitus ([place])   . Arthritis     "left pointer" (05/30/2014)  . Depression   . Symptomatic bradycardia     status post dual AV permanent transvenous pacemaker insertion  . On continuous oral anticoagulation     PAST SURGICAL HISTORY: Past Surgical History  Procedure Laterality Date  . Cataract extraction w/ intraocular lens  implant, bilateral Bilateral 2000's  . Pacemaker insertion  05/31/14    STJ dual chamber pacemaker implanted by Dr Lovena Le for symptomatic bradycardia  . Permanent pacemaker insertion N/A 05/31/2014    Procedure: PERMANENT PACEMAKER INSERTION;  Surgeon: Evans Lance, MD;  Location[facility] CATH LAB;  Service: Cardiovascular;  Laterality: N/A;    FAMILY HISTORY: Family History  Problem Relation Age of Onset  . Heart attack Neg Hx     SOCIAL HISTORY: Social History   Social History  . Marital Status: Married    Spouse Name: N/A  . Number of Children: 2  . Years of Education: N/A   Occupational History  . retired Optometrist    Social History Main Topics  . Smoking status: Former Smoker -- 1 years    Types: Cigarettes  . Smokeless tobacco: Never Used     Comment: "quit smoking in ~ 1952  . Alcohol Use: Yes     Comment: 05/30/2014 "glass of wine ~ once/month"  . Drug Use: No  . Sexual Activity: Yes   Other Topics Concern  . Not on file   Social History Narrative     PHYSICAL EXAM  Filed Vitals:   12/06/15 1303  BP: 128/63  Pulse: 73  Height: _0  (1.778 m)  Weight: 174 lb 3.2 oz (79.017 kg)   Body mass index is 25 kg/(m^2).  Generalized: Well developed, in no acute distress  Head: normocephalic and atraumatic,. Oropharynx benign  Neck: Supple, no carotid bruits  Cardiac: Regular rate rhythm, no murmur or gallop Lungs clear to auscultation  Musculoskeletal: No deformity    Neurological examination   Mentation: Alert oriented to time, place, history taking. Attention span and concentration appropriate. Recent and remote memory intact.  Follows all commands speech and language fluent.   Cranial nerve II-XII: Pupils were equal round reactive to light extraocular movements were full, visual field were full on confrontational test. Facial sensation normal, mild left facial asymmetry. hearing was intact to finger rubbing bilaterally. Uvula tongue midline. head turning and shoulder shrug were normal and symmetric.Tongue protrusion into cheek strength was normal. Motor: normal bulk and tone, full strength in the BUE, BLE, on the right, decreased grip strength on the left mild decreased strength in upper and lower 4 out of 5 on the left. Mild diminished fine finger movements on the left Sensory: normal and symmetric to light touch, pinprick,  and  Vibration,   Coordination: finger-nose-finger, heel-to-shin bilaterally, no dysmetria Gait and Station: Rising up from seated position without assistance, wide-based stance,  moderate stride, good arm swing, smooth turning,mild left foot drop.  Romberg is negative, ambulated with single-point cane DIAGNOSTIC DATA (LABS, IMAGING, TESTING) - I reviewed patient records, labs, notes, testing and imaging myself where available.  Lab Results  Component Value Date   WBC 6.6 11/23/2015   HGB 11.9* 11/23/2015   HCT 35.6* 11/23/2015   MCV 98.9 11/23/2015   PLT 115* 11/23/2015      Component Value Date/Time   NA 139 11/24/2015 0548   K 4.0 11/24/2015 0548   CL 106 11/24/2015 0548   CO2 25 11/24/2015 0548   GLUCOSE 109* 11/24/2015 0548   BUN 16 11/24/2015 0548   CREATININE 1.15 11/24/2015 0548   CALCIUM 8.6* 11/24/2015 0548   PROT 7.0 11/22/2015 1656   ALBUMIN 3.8 11/22/2015 1656   AST 22 11/22/2015 1656   ALT 19 11/22/2015 1656   ALKPHOS 78 11/22/2015 1656   BILITOT 0.7 11/22/2015 1656   GFRNONAA 56* 11/24/2015 0548    GFRAA >60 11/24/2015 0548   Lab Results  Component Value Date   CHOL 109 11/20/2015   HDL 28* 11/20/2015   LDLCALC 71 11/20/2015   TRIG 52 11/20/2015   CHOLHDL 3.9 11/20/2015   Lab Results  Component Value Date   HGBA1C 6.8* 11/24/2015    ASSESSMENT AND PLAN  82 y.o. year old male  has a past medical history of Hypertension; Hyperlipidemia;  Bradycardia; Atrial fibrillation ([place]); Type II diabetes mellitus ([place]); Arthritis;  and nondominant right small infarct to small be seen on imaging. Infarct felt to be embolic due to known history of atrial fibrillation. Was on Eloquis  on admission switched  to pradaxa. The patient is a current patient of Dr. Leonie Man  who is out of the office today . This note is sent to the work in doctor.      Control of stroke risk factors Keep blood pressure less than 156 systolic today's [id number] continue hypertensive medications Continue Pradaxa for secondary stroke prevention and atrial fibrillation Hemoglobin A1c less than 7 continue diabetes medications,most recent hemoglobin A1c 6.8 Keep LDL less than 70 remain on statin Continue  physical therapy 3 times a week and then continue home exercise program with this concludes Increase activity slowly Follow-up with Dr. Gwenlyn Found cardiologist is planned Follow-up in 3 months Dennie Bible, [facility], [facility], [facility] [address], [place], [place] [address] [phone]

## 2015-12-13 ENCOUNTER — Ambulatory Visit (INDEPENDENT_AMBULATORY_CARE_PROVIDER_SITE_OTHER): Payer: Medicare Other | Admitting: Cardiovascular Disease

## 2015-12-13 ENCOUNTER — Encounter: Payer: Self-pay | Admitting: Cardiovascular Disease

## 2015-12-13 VITALS — BP 118/62 | HR 64 | Ht 70.0 in | Wt 176.6 lb

## 2015-12-13 DIAGNOSIS — I482 Chronic atrial fibrillation, unspecified: Secondary | ICD-10-CM

## 2015-12-13 DIAGNOSIS — I10 Essential (primary) hypertension: Secondary | ICD-10-CM

## 2015-12-13 DIAGNOSIS — E785 Hyperlipidemia, unspecified: Secondary | ICD-10-CM | POA: Diagnosis not present

## 2015-12-13 NOTE — Assessment & Plan Note (Signed)
History of hypertension with blood pressure measured 118/62. He is on amlodipine and Diovan. Continue current meds at current dosing

## 2015-12-13 NOTE — Assessment & Plan Note (Signed)
History of hyperlipidemia on statin therapy with recent lipid profile performed 11/20/15 revealed a total cholesterol 19, LDL 71 and HDL of 28.

## 2015-12-13 NOTE — Patient Instructions (Signed)

## 2015-12-13 NOTE — Assessment & Plan Note (Signed)
History of atrial fibrillation with a slow ventricular response status post permanent transvenous pacemaker insertion by Dr. Ladona Ridgel 05/31/14 with a [employer]. Jude DDD  Device. He was on Eliquis oral anticoagulation recently had 2 TIAs. He was seen by Dr. Pearlean Brownie recommended changing to Pradaxa

## 2015-12-13 NOTE — Progress Notes (Signed)
12/13/2015 Ross Morgan   09/25/1928  [id number]  Primary Physician Ross Redwood, MD Primary Cardiologist: Ross Harp MD Ross Morgan  HPI:   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 There is no other cardiovascular risk factor history. He has had bradycardia in the past. He says his blood pressure at home. He's had recent heart rates in the 30s and 40s with excessive fatigue and dizziness which is orthostatic. He denies chest pain but does have dyspnea on exertion. He had an event monitor that showed significant bradycardia and ultimately was admitted and underwent permanent pacemaker insertion by Dr. Lovena Morgan on 05/31/14. He had a St. Jude DDD permanent pacemaker (serial [id number]) inserted. He had a 2-D echocardiogram performed on 05/06/14 which was essentially normal. His CHA2DSVASC score was 9 and he was properly anticoagulated with a novel oral anticoagulant. He feels symptomatically improved since pacemaker insertion. Dr. Lovena Morgan follows his pacemaker as an outpatient.  He was recently admitted earlier this month with a TIA. He was seen by Dr. Leonie Morgan. CT scan was negative of his head. His Eliquis  changed to Pradaxa . He is currently undergoing speech therapy and has mild continued dysarthria as well as weakness of his left foot.   Current Outpatient Prescriptions  Medication Sig Dispense Refill  . alfuzosin (UROXATRAL) 10 MG 24 hr tablet Take 10 mg by mouth every evening.    [name] amLODipine (NORVASC) 10 MG tablet Take 10 mg by mouth daily.    [name] atorvastatin (LIPITOR) 20 MG tablet Take 20 mg by mouth at bedtime.  6  . Calcium Carbonate-Simethicone (ALKA-SELTZER HEARTBURN + GAS) 750-80 MG CHEW Chew 1-2 each by mouth at  bedtime.    . citalopram (CELEXA) 10 MG tablet Take 10 mg by mouth at bedtime.     . Coenzyme Q10 (CO Q-10) 200 MG CAPS Take 1 capsule by mouth daily.     . dabigatran (PRADAXA) 150 MG CAPS capsule Take 1 capsule (150 mg total) by mouth every 12 (twelve) hours. 60 capsule 2  . fluorouracil (EFUDEX) 5 % cream Apply 1 application topically daily as needed (for irritation).    . Incontinent Wash (BALNEOL) LOTN Apply 1 application topically daily as needed (for irritation).     [name] lidocaine (XYLOCAINE) 5 % ointment Apply 1 application topically as needed. (Patient taking differently: Apply 1 application topically as needed for moderate pain (pain). ) 35.44 g 2  . Lidocaine-Hydrocortisone Ace (ANAMANTLE HC) 3-0.5 % KIT Place 1 application rectally daily as needed (for irritation).     . Multiple Vitamin (MULTIVITAMIN) capsule Take 1 capsule by mouth daily.    . NON FORMULARY Place 1 drop into the left eye at bedtime. [employer] lubricant eye drops left eye only    . NON FORMULARY Place 1 application into the right eye at bedtime. Mura 5% eye ointment in right eye only    . pimecrolimus (ELIDEL) 1 % cream Apply 1 application topically daily as needed (for face).     . polyethylene glycol (MIRALAX / GLYCOLAX) packet Take 17 g by mouth every morning.    . sitaGLIPtin-metformin (JANUMET) 50-1000 MG per tablet Take 1 tablet by mouth 2 (two) times daily with a meal.    . temazepam (RESTORIL) 15 MG capsule  Take 15 mg by mouth at bedtime as needed for sleep.    . valsartan (DIOVAN) 160 MG tablet Take 1 tablet (160 mg total) by mouth at bedtime. Resume in 3-4 days.    . vitamin C (ASCORBIC ACID) 500 MG tablet Take 500 mg by mouth daily.    [name] zinc oxide (BALMEX) 11.3 % CREA cream Apply 1 application topically daily as needed (for rash).     No current facility-administered medications for this visit.    Allergies  Allergen Reactions  . Hctz [Hydrochlorothiazide] Other (See Comments)    Severe reaction  . Zetia  [Ezetimibe] Other (See Comments)    LONG TIME AGO    Social History   Social History  . Marital Status: Married    Spouse Name: N/A  . Number of Children: 2  . Years of Education: N/A   Occupational History  . retired Optometrist    Social History Main Topics  . Smoking status: Former Smoker -- 1 years    Types: Cigarettes  . Smokeless tobacco: Never Used     Comment: "quit smoking in ~ 1952  . Alcohol Use: Yes     Comment: 05/30/2014 "glass of wine ~ once/month"  . Drug Use: No  . Sexual Activity: Yes   Other Topics Concern  . Not on file   Social History Narrative     Review of Systems: General: negative for chills, fever, night sweats or weight changes.  Cardiovascular: negative for chest pain, dyspnea on exertion, edema, orthopnea, palpitations, paroxysmal nocturnal dyspnea or shortness of breath Dermatological: negative for rash Respiratory: negative for cough or wheezing Urologic: negative for hematuria Abdominal: negative for nausea, vomiting, diarrhea, bright red blood per rectum, melena, or hematemesis Neurologic: negative for visual changes, syncope, or dizziness All other systems reviewed and are otherwise negative except as noted above.    Blood pressure 118/62, pulse 64, height _0  (1.778 m), weight 176 lb 9.6 oz (80.105 kg), SpO2 95 %.  General appearance: alert and no distress Neck: no adenopathy, no carotid bruit, no JVD, supple, symmetrical, trachea midline and thyroid not enlarged, symmetric, no tenderness/mass/nodules Lungs: clear to auscultation bilaterally Heart: regular rate and rhythm, S1, S2 normal, no murmur, click, rub or gallop Extremities: extremities normal, atraumatic, no cyanosis or edema  EKG not performed today  ASSESSMENT AND PLAN:   Essential hypertension History of hypertension with blood pressure measured 118/62. He is on amlodipine and Diovan. Continue current meds at current dosing  Hyperlipidemia History of  hyperlipidemia on statin therapy with recent lipid profile performed 11/20/15 revealed a total cholesterol 19, LDL 71 and HDL of 28.  Atrial fibrillation, slow VR and pauses History of atrial fibrillation with a slow ventricular response status post permanent transvenous pacemaker insertion by Dr. Lovena Morgan 05/31/14 with a St. Jude DDD  Device. He was on Eliquis oral anticoagulation recently had 2 TIAs. He was seen by Dr. Leonie Morgan recommended changing to [place] MD [facility], [facility] 12/13/2015 2:26 PM

## 2016-01-10 ENCOUNTER — Ambulatory Visit (INDEPENDENT_AMBULATORY_CARE_PROVIDER_SITE_OTHER): Payer: Medicare Other | Admitting: *Deleted

## 2016-01-10 ENCOUNTER — Telehealth: Payer: Self-pay | Admitting: Cardiology

## 2016-01-10 DIAGNOSIS — Z950 Presence of cardiac pacemaker: Secondary | ICD-10-CM | POA: Diagnosis not present

## 2016-01-10 DIAGNOSIS — R001 Bradycardia, unspecified: Secondary | ICD-10-CM | POA: Diagnosis not present

## 2016-01-10 NOTE — Telephone Encounter (Signed)
Spoke with pt and reminded pt of remote transmission that is due today. Pt verbalized understanding.   

## 2016-01-10 NOTE — Progress Notes (Signed)
Remote pacemaker transmission.   

## 2016-01-20 LAB — CUP PACEART REMOTE DEVICE CHECK
Battery Remaining Percentage: 95.5 %
Battery Voltage: 3.02 V
Brady Statistic AP VP Percent: 0 %
Brady Statistic AP VS Percent: 0 %
Brady Statistic AS VP Percent: 0 %
Brady Statistic RA Percent Paced: 1 %
Brady Statistic RV Percent Paced: 80 %
Implantable Lead Implant Date: 20150721
Implantable Lead Implant Date: 20150721
Implantable Lead Location: 753859
Implantable Lead Location: 753860
Lead Channel Impedance Value: 440 Ohm
Lead Channel Pacing Threshold Amplitude: 0.75 V
Lead Channel Pacing Threshold Pulse Width: 0.4 ms
Lead Channel Sensing Intrinsic Amplitude: 3.3 mV
Lead Channel Setting Pacing Amplitude: 2 V
Lead Channel Setting Pacing Amplitude: 2.5 V
Lead Channel Setting Sensing Sensitivity: 1.5 mV
MDC IDC MSMT BATTERY REMAINING LONGEVITY: 113 mo
MDC IDC MSMT LEADCHNL RA IMPEDANCE VALUE: 460 Ohm
MDC IDC MSMT LEADCHNL RA SENSING INTR AMPL: 3 mV
MDC IDC MSMT LEADCHNL RV PACING THRESHOLD AMPLITUDE: 0.75 V
MDC IDC MSMT LEADCHNL RV PACING THRESHOLD PULSEWIDTH: 0.4 ms
MDC IDC PG SERIAL: 7643490
MDC IDC SESS DTM: 20170301160735
MDC IDC SET LEADCHNL RV PACING PULSEWIDTH: 0.4 ms
MDC IDC STAT BRADY AS VS PERCENT: 0 %

## 2016-01-20 NOTE — Progress Notes (Signed)
Normal remote reviewed. +Pradaxa 100% AF since mid summer 2016  [facility]

## 2016-01-24 ENCOUNTER — Encounter: Payer: Self-pay | Admitting: Cardiology

## 2016-03-06 ENCOUNTER — Ambulatory Visit (INDEPENDENT_AMBULATORY_CARE_PROVIDER_SITE_OTHER): Payer: Medicare Other | Admitting: Nurse Practitioner

## 2016-03-06 ENCOUNTER — Encounter: Payer: Self-pay | Admitting: Nurse Practitioner

## 2016-03-06 VITALS — BP 148/92 | HR 100 | Ht 70.0 in | Wt 171.2 lb

## 2016-03-06 DIAGNOSIS — E785 Hyperlipidemia, unspecified: Secondary | ICD-10-CM | POA: Diagnosis not present

## 2016-03-06 DIAGNOSIS — M6289 Other specified disorders of muscle: Secondary | ICD-10-CM | POA: Diagnosis not present

## 2016-03-06 DIAGNOSIS — R531 Weakness: Secondary | ICD-10-CM

## 2016-03-06 DIAGNOSIS — I10 Essential (primary) hypertension: Secondary | ICD-10-CM | POA: Diagnosis not present

## 2016-03-06 DIAGNOSIS — I639 Cerebral infarction, unspecified: Secondary | ICD-10-CM | POA: Diagnosis not present

## 2016-03-06 NOTE — Progress Notes (Signed)
I agree with the above plan 

## 2016-03-06 NOTE — Patient Instructions (Addendum)
Control of stroke risk factors Keep blood pressure less than 130 systolic today's [id number] continue hypertensive medications Continue Pradaxa for secondary stroke prevention and atrial fibrillation Hemoglobin A1c less than 7 continue diabetes medications,most recent hemoglobin A1c 6.8 Keep LDL less than 70 remain on statin Continue to Increase activity slowly Follow-up in 6 months

## 2016-03-06 NOTE — Progress Notes (Signed)
[facility]  PATIENT: Laverne Klugh DOB: 1928-07-19   REASON FOR VISIT: Follow-up for stroke with risk factors of atrial fibrillation, hyperlipidemia, hypertension and diabetes HISTORY FROM: Patient    HISTORY OF PRESENT ILLNESS:Mr Angus, 82 year old male returns for follow-up. He was last seen in the office 12/06/2015 for hospital follow-up after admission 11/22/15 for slurred speech and left facial droop and left foot drop. He has not had further stroke or TIA symptoms. His speech therapy and his physical therapies have concluded. He claims his blood sugars have been up and down mainly because he's been eating junk food. He remains on Pradaxa and his statin medication for secondary stroke prevention. He is back to driving. He has an AFO now to the left lower leg and ambulates with a single-point cane. In the last 3 weeks he's had to place his wife in a skilled facility. He returns for reevaluation   HISTORY 12/06/15 CM Mr. Hershey, 82 year old male returns for hospital follow-up. He was admitted on 11/22/15 and discharged on 11/24/15. He was admitted with slurred speech, left facial droop and left foot drop. He did not receive TPA due to being out of the time window. Review of hospital records.Stroke with nondominant right small infarct too small to be seen on imaging felt to be secondary to known atrial fibrillation. He has permanent pacemaker. 2-D echo ejection fraction 45-50% down from previous 60%. LDL was 71 hemoglobin A1c6.8. For his atrial fibrillation he was switched to Pradaxa and told to continue his statin. He is currently receiving in-home physical therapy 3 times a week and his left-sided symptoms are improving. He is not driving. He is using a walker in the home, he returns for reevaluation.   REVIEW OF SYSTEMS: Full 14 system review of systems performed and notable only for those listed, all others are neg:  Constitutional: neg  Cardiovascular:  neg Ear/Nose/Throat: neg  Skin: neg Eyes: neg Respiratory: neg Gastroitestinal: neg  Hematology/Lymphatic: neg  Endocrine: neg Musculoskeletal:neg Allergy/Immunology: neg Neurological: neg Psychiatric: neg Sleep : Insomnia, recently prescribed Restoril   ALLERGIES: Allergies  Allergen Reactions  . Hctz [Hydrochlorothiazide] Other (See Comments)    Severe reaction  . Zetia [Ezetimibe] Other (See Comments)    LONG TIME AGO    HOME MEDICATIONS: Outpatient Prescriptions Prior to Visit  Medication Sig Dispense Refill  . alfuzosin (UROXATRAL) 10 MG 24 hr tablet Take 10 mg by mouth every evening.    [name] amLODipine (NORVASC) 10 MG tablet Take 5 mg by mouth daily.     [name] atorvastatin (LIPITOR) 20 MG tablet Take 20 mg by mouth at bedtime.  6  . Calcium Carbonate-Simethicone (ALKA-SELTZER HEARTBURN + GAS) 750-80 MG CHEW Chew 1-2 each by mouth at bedtime.    . citalopram (CELEXA) 10 MG tablet Take 20 mg by mouth at bedtime.     . Coenzyme Q10 (CO Q-10) 200 MG CAPS Take 1 capsule by mouth daily.     . dabigatran (PRADAXA) 150 MG CAPS capsule Take 1 capsule (150 mg total) by mouth every 12 (twelve) hours. 60 capsule 2  . fluorouracil (EFUDEX) 5 % cream Apply 1 application topically daily as needed (for irritation).    . Incontinent Wash (BALNEOL) LOTN Apply 1 application topically daily as needed (for irritation).     [name] lidocaine (XYLOCAINE) 5 % ointment Apply 1 application topically as needed. (Patient taking differently: Apply 1 application topically as needed for moderate pain (pain). ) 35.44 g 2  . Lidocaine-Hydrocortisone Ace (ANAMANTLE HC)  3-0.5 % KIT Place 1 application rectally daily as needed (for irritation).     . Multiple Vitamin (MULTIVITAMIN) capsule Take 1 capsule by mouth daily.    . NON FORMULARY Place 1 drop into the left eye at bedtime. [facility] lubricant eye drops left eye only    . NON FORMULARY Place 1 application into the right eye at bedtime. Mura 5% eye ointment in right  eye only    . pimecrolimus (ELIDEL) 1 % cream Apply 1 application topically daily as needed (for face).     . polyethylene glycol (MIRALAX / GLYCOLAX) packet Take 17 g by mouth every morning.    . sitaGLIPtin-metformin (JANUMET) 50-1000 MG per tablet Take 1 tablet by mouth 2 (two) times daily with a meal.    . temazepam (RESTORIL) 15 MG capsule Take 15 mg by mouth at bedtime as needed for sleep.    . valsartan (DIOVAN) 160 MG tablet Take 1 tablet (160 mg total) by mouth at bedtime. Resume in 3-4 days.    . vitamin C (ASCORBIC ACID) 500 MG tablet Take 500 mg by mouth daily.    [name] zinc oxide (BALMEX) 11.3 % CREA cream Apply 1 application topically daily as needed (for rash).     No facility-administered medications prior to visit.    PAST MEDICAL HISTORY: Past Medical History  Diagnosis Date  . Hypertension   . Hyperlipidemia   . Anal fissure   . Knee pain 2015    "tore tendon left knee; no OR"  . Bradycardia   . Atrial fibrillation ([place])   . Type II diabetes mellitus ([place])   . Arthritis     "left pointer" (05/30/2014)  . Depression   . Symptomatic bradycardia     status post dual AV permanent transvenous pacemaker insertion  . On continuous oral anticoagulation     PAST SURGICAL HISTORY: Past Surgical History  Procedure Laterality Date  . Cataract extraction w/ intraocular lens  implant, bilateral Bilateral 2000's  . Pacemaker insertion  05/31/14    STJ dual chamber pacemaker implanted by Dr Lovena Le for symptomatic bradycardia  . Permanent pacemaker insertion N/A 05/31/2014    Procedure: PERMANENT PACEMAKER INSERTION;  Surgeon: Evans Lance, MD;  [facility] CATH LAB;  Service: Cardiovascular;  Laterality: N/A;    FAMILY HISTORY: Family History  Problem Relation Age of Onset  . Heart attack Neg Hx     SOCIAL HISTORY: Social History   Social History  . Marital Status: Married    Spouse Name: N/A  . Number of Children: 2  . Years of Education: N/A   Occupational  History  . retired Optometrist    Social History Main Topics  . Smoking status: Former Smoker -- 1 years    Types: Cigarettes  . Smokeless tobacco: Never Used     Comment: "quit smoking in ~ 1952  . Alcohol Use: Yes     Comment: 05/30/2014 "glass of wine ~ once/month"  . Drug Use: No  . Sexual Activity: Yes   Other Topics Concern  . Not on file   Social History Narrative     PHYSICAL EXAM  Filed Vitals:   03/06/16 1255  BP: 148/92  Pulse: 130  Height: '5\' 10"'$  (1.778 m)  Weight: 171 lb 3.2 oz (77.656 kg)   Body mass index is 24.56 kg/(m^2). Generalized: Well developed, in no acute distress  Head: normocephalic and atraumatic,. Oropharynx benign  Neck: Supple, no carotid bruits  Cardiac: Regular rate rhythm, no murmur  or gallop Lungs clear to auscultation  Musculoskeletal: No deformity   Neurological examination   Mentation: Alert oriented to time, place, history taking. Attention span and concentration appropriate. Recent and remote memory intact. Follows all commands speech and language fluent.   Cranial nerve II-XII: Pupils were equal round reactive to light extraocular movements were full, visual field were full on confrontational test. Facial sensation normal, mild left facial asymmetry. hearing was intact to finger rubbing bilaterally. Uvula tongue midline. head turning and shoulder shrug were normal and symmetric.Tongue protrusion into cheek strength was normal. Motor: normal bulk and tone, full strength in the BUE, BLE, on the right, decreased grip strength on the left mild decreased strength in upper extremity and  4 out of 5 on the left lower extremity.  Mild diminished fine finger movements on the left Sensory: normal and symmetric to light touch, pinprick, and Vibration,  Coordination: finger-nose-finger, heel-to-shin bilaterally, no dysmetria Gait and Station: Rising up from seated position without assistance, wide-based stance, moderate stride, good  arm swing, smooth turning,mild left foot drop.Left AFO in place Romberg is negative, ambulated with single-point cane   DIAGNOSTIC DATA (LABS, IMAGING, TESTING) - I reviewed patient records, labs, notes, testing and imaging myself where available.  Lab Results  Component Value Date   WBC 6.6 11/23/2015   HGB 11.9* 11/23/2015   HCT 35.6* 11/23/2015   MCV 98.9 11/23/2015   PLT 115* 11/23/2015      Component Value Date/Time   NA 139 11/24/2015 0548   K 4.0 11/24/2015 0548   CL 106 11/24/2015 0548   CO2 25 11/24/2015 0548   GLUCOSE 109* 11/24/2015 0548   BUN 16 11/24/2015 0548   CREATININE 1.15 11/24/2015 0548   CALCIUM 8.6* 11/24/2015 0548   PROT 7.0 11/22/2015 1656   ALBUMIN 3.8 11/22/2015 1656   AST 22 11/22/2015 1656   ALT 19 11/22/2015 1656   ALKPHOS 78 11/22/2015 1656   BILITOT 0.7 11/22/2015 1656   GFRNONAA 56* 11/24/2015 0548   GFRAA >60 11/24/2015 0548   Lab Results  Component Value Date   CHOL 109 11/20/2015   HDL 28* 11/20/2015   LDLCALC 71 11/20/2015   TRIG 52 11/20/2015   CHOLHDL 3.9 11/20/2015   Lab Results  Component Value Date   HGBA1C 6.8* 11/24/2015    ASSESSMENT AND PLAN 82 y.o. year old male has a past medical history of Hypertension; Hyperlipidemia; Bradycardia; Atrial fibrillation ([place]); Type II diabetes mellitus ([place]); Arthritis; and nondominant right small infarct to small be seen on imaging. Infarct felt to be embolic due to known history of atrial fibrillation. Was on Eloquis on admission switched to pradaxa   Control of stroke risk factors Keep blood pressure less than 400 systolic today's [id number] continue hypertensive medications Continue Pradaxa for secondary stroke prevention and atrial fibrillation Hemoglobin A1c less than 7 continue diabetes medications,most recent hemoglobin A1c 6.8 Keep LDL less than 70 remain on Lipitor Continue to Increase activity slowly, use cane and wear AFO for safe ambulation Follow-up in 6  months, next with Dr. Sloan Leiter, [facility], [facility], [facility] [address] [place], [place] [address] [phone]

## 2016-03-21 ENCOUNTER — Telehealth: Payer: Self-pay | Admitting: Nurse Practitioner

## 2016-03-21 NOTE — Telephone Encounter (Signed)
Reviewed labs drawn on 02/26/2016. Glucose 124 otherwise CMP normal, cholesterol 108, LDL 66 hemoglobin A1c 6.4

## 2016-04-10 ENCOUNTER — Ambulatory Visit (INDEPENDENT_AMBULATORY_CARE_PROVIDER_SITE_OTHER): Payer: Medicare Other | Admitting: *Deleted

## 2016-04-10 DIAGNOSIS — R001 Bradycardia, unspecified: Secondary | ICD-10-CM

## 2016-04-10 NOTE — Progress Notes (Signed)
Remote pacemaker transmission.   

## 2016-04-25 LAB — CUP PACEART REMOTE DEVICE CHECK
Battery Remaining Longevity: 115 mo
Battery Remaining Percentage: 95.5 %
Battery Voltage: 3.02 V
Brady Statistic AP VP Percent: 0 %
Brady Statistic AP VS Percent: 0 %
Brady Statistic AS VP Percent: 0 %
Brady Statistic RA Percent Paced: 1 %
Brady Statistic RV Percent Paced: 77 %
Date Time Interrogation Session: 20170531060031
Implantable Lead Implant Date: 20150721
Implantable Lead Location: 753860
Lead Channel Impedance Value: 460 Ohm
Lead Channel Pacing Threshold Amplitude: 0.75 V
Lead Channel Pacing Threshold Pulse Width: 0.4 ms
Lead Channel Sensing Intrinsic Amplitude: 3 mV
Lead Channel Setting Sensing Sensitivity: 1.5 mV
MDC IDC LEAD IMPLANT DT: 20150721
MDC IDC LEAD LOCATION: 753859
MDC IDC MSMT LEADCHNL RA PACING THRESHOLD AMPLITUDE: 0.75 V
MDC IDC MSMT LEADCHNL RV IMPEDANCE VALUE: 450 Ohm
MDC IDC MSMT LEADCHNL RV PACING THRESHOLD PULSEWIDTH: 0.4 ms
MDC IDC MSMT LEADCHNL RV SENSING INTR AMPL: 3.4 mV
MDC IDC SET LEADCHNL RA PACING AMPLITUDE: 2 V
MDC IDC SET LEADCHNL RV PACING AMPLITUDE: 2.5 V
MDC IDC SET LEADCHNL RV PACING PULSEWIDTH: 0.4 ms
MDC IDC STAT BRADY AS VS PERCENT: 0 %
Pulse Gen Model: 2240
Pulse Gen Serial Number: 7643490

## 2016-05-01 ENCOUNTER — Encounter: Payer: Self-pay | Admitting: Cardiology

## 2016-09-04 ENCOUNTER — Encounter: Payer: Self-pay | Admitting: Neurology

## 2016-09-04 ENCOUNTER — Ambulatory Visit (INDEPENDENT_AMBULATORY_CARE_PROVIDER_SITE_OTHER): Payer: Medicare Other | Admitting: Neurology

## 2016-09-04 ENCOUNTER — Encounter: Payer: Self-pay | Admitting: Nurse Practitioner

## 2016-09-04 VITALS — BP 154/87 | HR 69 | Ht 70.0 in | Wt 171.0 lb

## 2016-09-04 DIAGNOSIS — I6990 Unspecified sequelae of unspecified cerebrovascular disease: Secondary | ICD-10-CM | POA: Diagnosis not present

## 2016-09-04 NOTE — Patient Instructions (Signed)
I had a long d/w patient about his remote stroke,atrial fibrillation, risk for recurrent stroke/TIAs, personally independently reviewed imaging studies and stroke evaluation results and answered questions.Continue Pradaxa (dabigatran) twice a day  for secondary stroke prevention and maintain strict control of hypertension with blood pressure goal below 130/90, diabetes with hemoglobin A1c goal below 6.5% and lipids with LDL cholesterol goal below 70 mg/dL. I also advised the patient to eat a healthy diet with plenty of whole grains, cereals, fruits and vegetables, exercise regularly and maintain ideal body weight .check screening carotid ultrasound study. Followup in the future with me in one year or call earlier if necessary.

## 2016-09-04 NOTE — Progress Notes (Signed)
[facility]  PATIENT: Ross Morgan DOB: 02-09-1928   REASON FOR VISIT: Follow-up for stroke with risk factors of atrial fibrillation, hyperlipidemia, hypertension and diabetes HISTORY FROM: Patient    HISTORY OF PRESENT ILLNESS:Ross Morgan, 82 year old male returns for follow-up. He was last seen in the office 12/06/2015 for hospital follow-up after admission 11/22/15 for slurred speech and left facial droop and left foot drop. He has not had further stroke or TIA symptoms. His speech therapy and his physical therapies have concluded. He claims his blood sugars have been up and down mainly because he's been eating junk food. He remains on Pradaxa and his statin medication for secondary stroke prevention. He is back to driving. He has an AFO now to the left lower leg and ambulates with a single-point cane. In the last 3 weeks he's had to place his wife in a skilled facility. He returns for reevaluation   HISTORY 12/06/15 CM Ross Morgan, 82 year old male returns for hospital follow-up. He was admitted on 11/22/15 and discharged on 11/24/15. He was admitted with slurred speech, left facial droop and left foot drop. He did not receive TPA due to being out of the time window. Review of hospital records.Stroke with nondominant right small infarct too small to be seen on imaging felt to be secondary to known atrial fibrillation. He has permanent pacemaker. 2-D echo ejection fraction 45-50% down from previous 60%. LDL was 71 hemoglobin A1c6.8. For his atrial fibrillation he was switched to Pradaxa and told to continue his statin. He is currently receiving in-home physical therapy 3 times a week and his left-sided symptoms are improving. He is not driving. He is using a walker in the home, he returns for reevaluation. Update 09/04/2016 ; He returns for follow-up after last visit 6 months ago. Continues to do well. He states his speech is improved back to normal. He is tolerating Pradaxa well  without significant bleeding and only occasional minor bruising. He states her blood pressure is usually well controlled and today it is elevated at 154/87 and he blames this on the anxiety related to the visit. His tolerating Lipitor well without muscle aches and pains. He does have lipid profile checks every 6 month by her primary physician at last check it was fine. He continues to drag his left leg a little bit but can walk without assistance and has not had any falls. He has no new complaints today. REVIEW OF SYSTEMS: Full 14 system review of systems performed and notable only for those listed, all others are neg:   Easy bruising, frequency of urination, dry eyes, blurred vision and all systems negative   ALLERGIES: Allergies  Allergen Reactions  . Hctz [Hydrochlorothiazide] Other (See Comments)    Severe reaction  . Zetia [Ezetimibe] Other (See Comments)    LONG TIME AGO    HOME MEDICATIONS: Outpatient Medications Prior to Visit  Medication Sig Dispense Refill  . alfuzosin (UROXATRAL) 10 MG 24 hr tablet Take 10 mg by mouth every evening.    [name] amLODipine (NORVASC) 10 MG tablet Take 5 mg by mouth daily.     [name] atorvastatin (LIPITOR) 20 MG tablet Take 20 mg by mouth at bedtime.  6  . Calcium Carbonate-Simethicone (ALKA-SELTZER HEARTBURN + GAS) 750-80 MG CHEW Chew 1-2 each by mouth at bedtime.    . citalopram (CELEXA) 10 MG tablet Take 20 mg by mouth at bedtime.     . Coenzyme Q10 (CO Q-10) 200 MG CAPS Take 1 capsule by mouth daily.     [name]  dabigatran (PRADAXA) 150 MG CAPS capsule Take 1 capsule (150 mg total) by mouth every 12 (twelve) hours. 60 capsule 2  . fluorouracil (EFUDEX) 5 % cream Apply 1 application topically daily as needed (for irritation).    . Incontinent Wash (BALNEOL) LOTN Apply 1 application topically daily as needed (for irritation).     [name] lidocaine (XYLOCAINE) 5 % ointment Apply 1 application topically as needed. (Patient taking differently: Apply 1 application topically  as needed for moderate pain (pain). ) 35.44 g 2  . Lidocaine-Hydrocortisone Ace (ANAMANTLE HC) 3-0.5 % KIT Place 1 application rectally daily as needed (for irritation).     . Multiple Vitamin (MULTIVITAMIN) capsule Take 1 capsule by mouth daily.    . NON FORMULARY Place 1 drop into the left eye at bedtime. [employer] lubricant eye drops left eye only    . NON FORMULARY Place 1 application into the right eye at bedtime. Mura 5% eye ointment in right eye only    . pimecrolimus (ELIDEL) 1 % cream Apply 1 application topically daily as needed (for face).     . polyethylene glycol (MIRALAX / GLYCOLAX) packet Take 17 g by mouth every morning.    . sitaGLIPtin-metformin (JANUMET) 50-1000 MG per tablet Take 1 tablet by mouth 2 (two) times daily with a meal.    . temazepam (RESTORIL) 15 MG capsule Take 15 mg by mouth at bedtime as needed for sleep.    . valsartan (DIOVAN) 160 MG tablet Take 1 tablet (160 mg total) by mouth at bedtime. Resume in 3-4 days.    . vitamin C (ASCORBIC ACID) 500 MG tablet Take 500 mg by mouth daily.    [name] zinc oxide (BALMEX) 11.3 % CREA cream Apply 1 application topically daily as needed (for rash).     No facility-administered medications prior to visit.     PAST MEDICAL HISTORY: Past Medical History:  Diagnosis Date  . Anal fissure   . Arthritis    "left pointer" (05/30/2014)  . Atrial fibrillation ([place])   . Bradycardia   . Depression   . Hyperlipidemia   . Hypertension   . Knee pain 2015   "tore tendon left knee; no OR"  . On continuous oral anticoagulation   . Stroke ([place])   . Symptomatic bradycardia    status post dual AV permanent transvenous pacemaker insertion  . Type II diabetes mellitus ([place])     PAST SURGICAL HISTORY: Past Surgical History:  Procedure Laterality Date  . CATARACT EXTRACTION W/ INTRAOCULAR LENS  IMPLANT, BILATERAL Bilateral 2000's  . PACEMAKER INSERTION  05/31/14   STJ dual chamber pacemaker implanted by Dr Lovena Le for symptomatic bradycardia   . PERMANENT PACEMAKER INSERTION N/A 05/31/2014   Procedure: PERMANENT PACEMAKER INSERTION;  Surgeon: Evans Lance, MD;  Location: [facility] CATH LAB;  Service: Cardiovascular;  Laterality: N/A;    FAMILY HISTORY: Family History  Problem Relation Age of Onset  . Heart attack Neg Hx     SOCIAL HISTORY: Social History   Social History  . Marital status: Married    Spouse name: N/A  . Number of children: 2  . Years of education: N/A   Occupational History  . retired Optometrist    Social History Main Topics  . Smoking status: Former Smoker    Years: 1.00    Types: Cigarettes  . Smokeless tobacco: Never Used     Comment: "quit smoking in ~ 1952  . Alcohol use Yes     Comment: 05/30/2014 "glass of  wine ~ once/month"  . Drug use: No  . Sexual activity: Yes   Other Topics Concern  . Not on file   Social History Narrative  . No narrative on file     PHYSICAL EXAM  Vitals:   09/04/16 1340  BP: (!) 154/87  Pulse: 69  Weight: 171 lb (77.6 kg)  Height: '5\' 10"'$  (1.778 m)   Body mass index is 24.54 kg/m. Generalized: Well developed, in no acute distress  Head: normocephalic and atraumatic,. Oropharynx benign  Neck: Supple, no carotid bruits  Cardiac: Regular rate rhythm, no murmur or gallop Lungs clear to auscultation  Musculoskeletal: No deformity   Neurological examination   Mentation: Alert oriented to time, place, history taking. Attention span and concentration appropriate. Recent and remote memory intact. Follows all commands speech and language fluent.   Cranial nerve II-XII: Pupils were equal round reactive to light extraocular movements were full, visual field were full on confrontational test. Facial sensation normal, mild left facial asymmetry. hearing was intact to finger rubbing bilaterally. Uvula tongue midline. head turning and shoulder shrug were normal and symmetric.Tongue protrusion into cheek strength was normal. Motor: normal bulk and tone, full  strength in the BUE, BLE, on the right, decreased grip strength on the left mild decreased strength in upper extremity and  4 out of 5 on the left lower extremity.  Mild diminished fine finger movements on the left Sensory: normal and symmetric to light touch, pinprick, and Vibration,  Coordination: finger-nose-finger, heel-to-shin bilaterally, no dysmetria Gait and Station: Rising up from seated position without assistance, wide-based stance, moderate stride, good arm swing, smooth turning,mild left foot drop.Tracks left leg slightly.    DIAGNOSTIC DATA (LABS, IMAGING, TESTING) - I reviewed patient records, labs, notes, testing and imaging myself where available.  Lab Results  Component Value Date   WBC 6.6 11/23/2015   HGB 11.9 (L) 11/23/2015   HCT 35.6 (L) 11/23/2015   MCV 98.9 11/23/2015   PLT 115 (L) 11/23/2015      Component Value Date/Time   NA 139 11/24/2015 0548   K 4.0 11/24/2015 0548   CL 106 11/24/2015 0548   CO2 25 11/24/2015 0548   GLUCOSE 109 (H) 11/24/2015 0548   BUN 16 11/24/2015 0548   CREATININE 1.15 11/24/2015 0548   CALCIUM 8.6 (L) 11/24/2015 0548   PROT 7.0 11/22/2015 1656   ALBUMIN 3.8 11/22/2015 1656   AST 22 11/22/2015 1656   ALT 19 11/22/2015 1656   ALKPHOS 78 11/22/2015 1656   BILITOT 0.7 11/22/2015 1656   GFRNONAA 56 (L) 11/24/2015 0548   GFRAA >60 11/24/2015 0548   Lab Results  Component Value Date   CHOL 109 11/20/2015   HDL 28 (L) 11/20/2015   LDLCALC 71 11/20/2015   TRIG 52 11/20/2015   CHOLHDL 3.9 11/20/2015   Lab Results  Component Value Date   HGBA1C 6.8 (H) 11/24/2015    ASSESSMENT AND PLAN 82 y.o. year old male has a past medical history of Hypertension; Hyperlipidemia; Bradycardia; Atrial fibrillation ([place]); Type II diabetes mellitus ([place]); Arthritis; and nondominant right small infarct to small be seen on imaging. Infarct felt to be embolic due to known history of atrial fibrillation. Was on Eloquis on admission  switched to pradaxa and doing well    I had a long d/w patient about his remote stroke,atrial fibrillation, risk for recurrent stroke/TIAs, personally independently reviewed imaging studies and stroke evaluation results and answered questions.Continue Pradaxa (dabigatran) twice a day  for secondary stroke prevention  and maintain strict control of hypertension with blood pressure goal below 130/90, diabetes with hemoglobin A1c goal below 6.5% and lipids with LDL cholesterol goal below 70 mg/dL. I also advised the patient to eat a healthy diet with plenty of whole grains, cereals, fruits and vegetables, exercise regularly and maintain ideal body weight .check screening carotid ultrasound study. Greater than 50% time during this 25 minute visit was spent on counseling and coordination of care about stroke risk, prevention and treatment Followup in the future with me in one year or call earlier if necessary. Antony Contras, MD [facility] [address] [place], [place] [address] [phone]

## 2016-09-09 ENCOUNTER — Telehealth: Payer: Self-pay | Admitting: Neurology

## 2016-09-09 NOTE — Telephone Encounter (Signed)
Hi Shannon Please call patient to schedule

## 2016-09-09 NOTE — Telephone Encounter (Signed)
Ross Morgan is ready to be scheduled for Doppler. Thanks Annabelle Harmanana .

## 2016-09-11 ENCOUNTER — Ambulatory Visit (INDEPENDENT_AMBULATORY_CARE_PROVIDER_SITE_OTHER): Payer: Medicare Other

## 2016-09-11 DIAGNOSIS — I6990 Unspecified sequelae of unspecified cerebrovascular disease: Secondary | ICD-10-CM

## 2016-09-20 ENCOUNTER — Telehealth: Payer: Self-pay

## 2016-09-20 NOTE — Telephone Encounter (Signed)
LFt vm for patient to call back on Monday about carotid duplex study results.

## 2016-09-20 NOTE — Telephone Encounter (Signed)
Dr.Sethi reviewed the carotid duplex study.The carotid duplex study was negative,and normal study.

## 2016-09-23 NOTE — Telephone Encounter (Signed)
Pt returned RN's call. She was with a pt,she will call him back

## 2016-09-23 NOTE — Telephone Encounter (Signed)
Pt called in for results. As per nurse conversation via skype relayed pts results to him. He expressed understanding.

## 2016-12-14 IMAGING — CT CT HEAD W/O CM
2 series · 16 of 30 positions shown, 20 images · non-contrast
Comparison: 11/19/2015

CLINICAL DATA: Cold stroke left-sided facial droop slurred speech

EXAM:
CT HEAD WITHOUT CONTRAST
TECHNIQUE: Contiguous axial images were obtained from the base of the skull
through the vertex without intravenous contrast.

[Series 201: head w/o, idose (1) · axial · non-contrast · 0.43mm/px · z∈[+64,+199]mm · 13 of 33 slices shown, 17 images]
[im 3/33  brain]
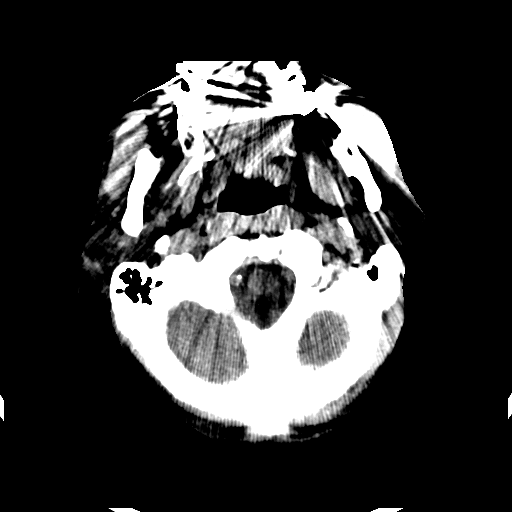
[im 3/33  bone]
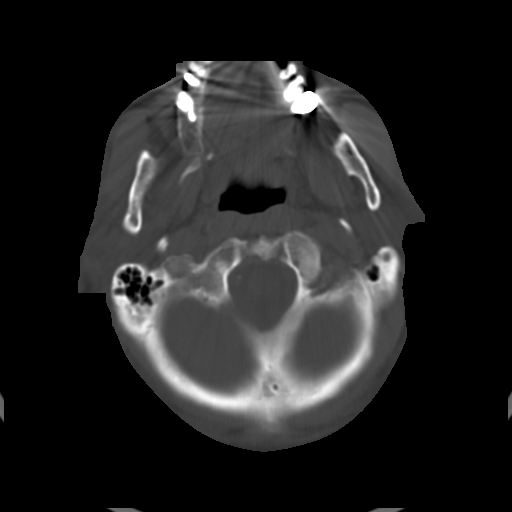
[im 5/33  brain]
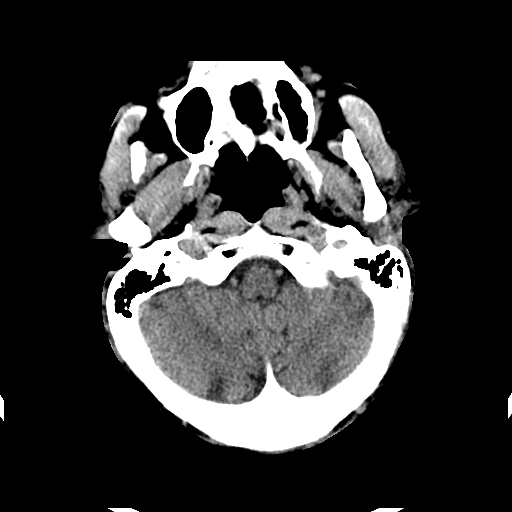
[im 7/33  brain]
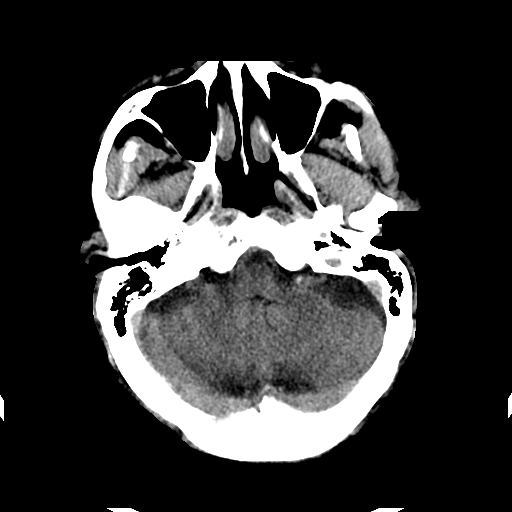
[im 10/33  brain]
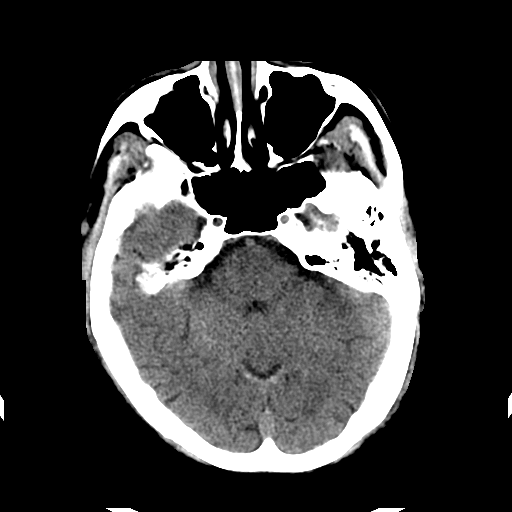
[im 12/33  brain]
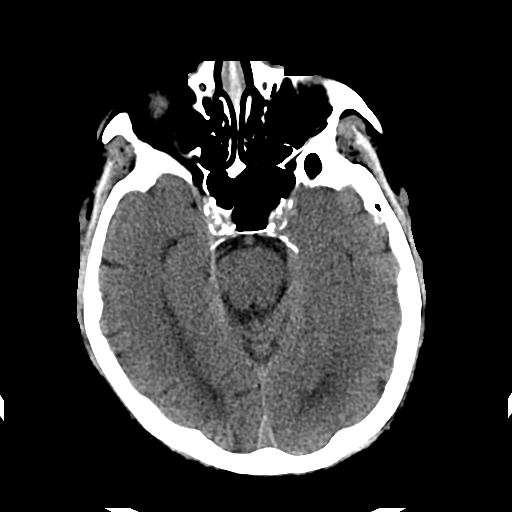
[im 12/33  bone]
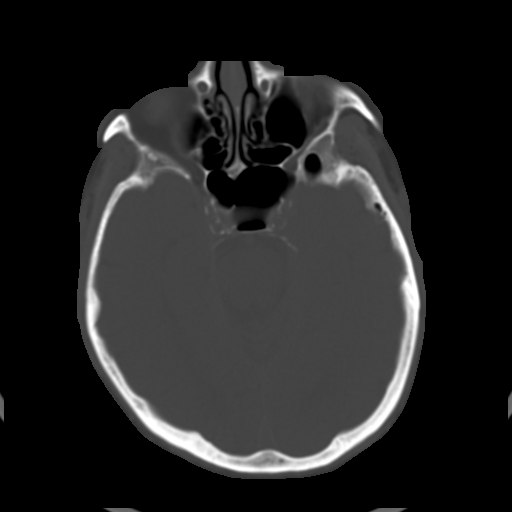
[im 14/33  brain]
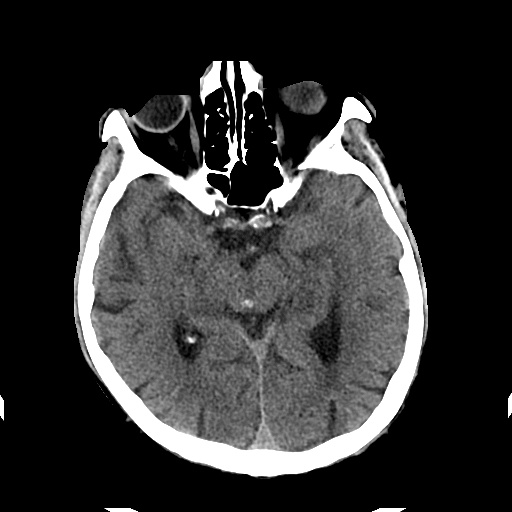
[im 17/33  brain]
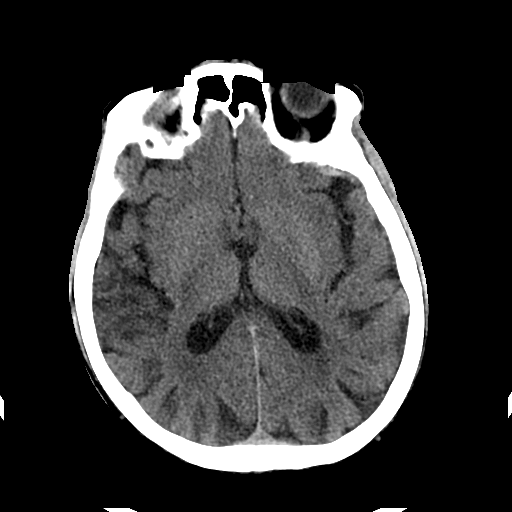
[im 19/33  brain]
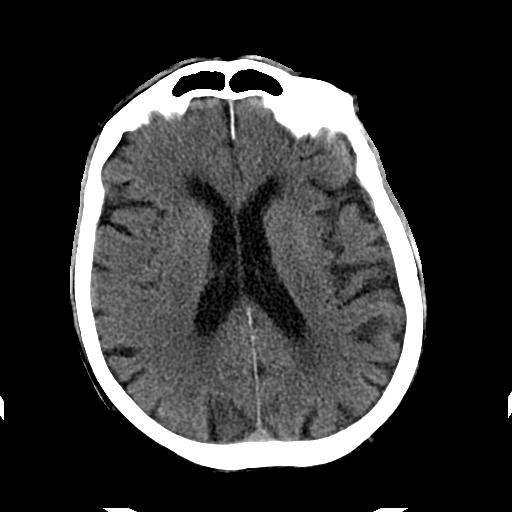
[im 21/33  brain]
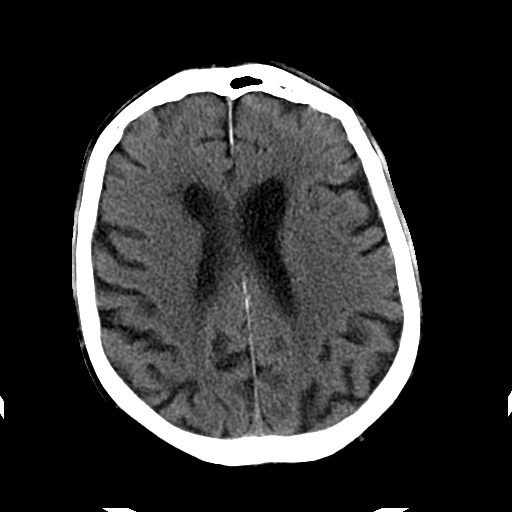
[im 21/33  bone]
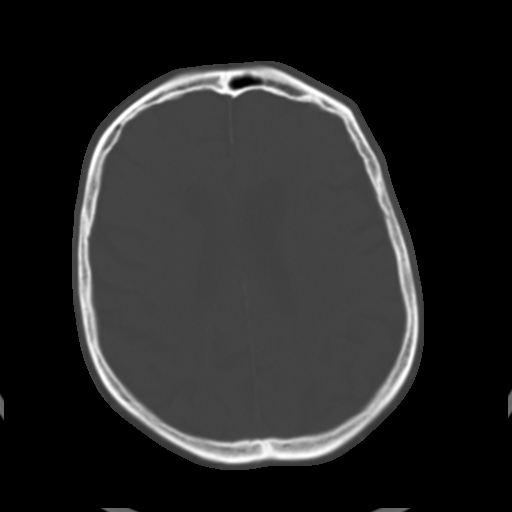
[im 23/33  brain]
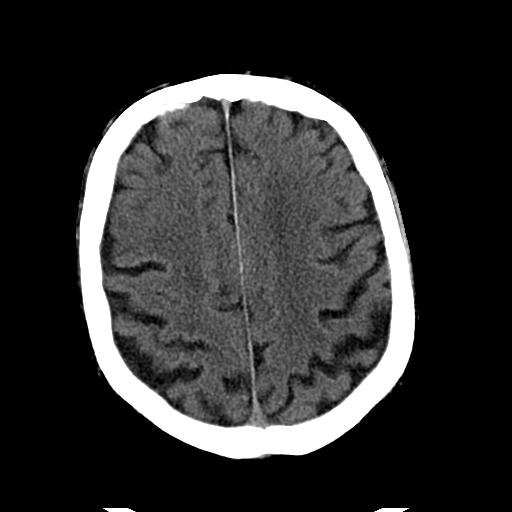
[im 26/33  brain]
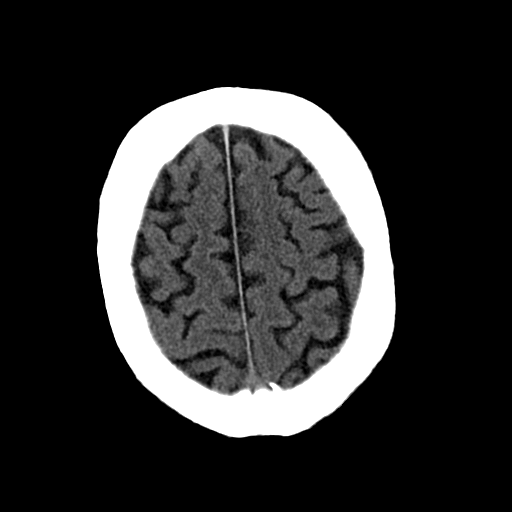
[im 28/33  brain]
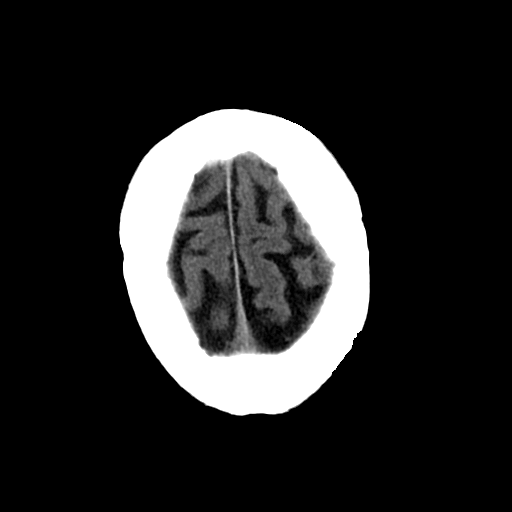
[im 30/33  brain]
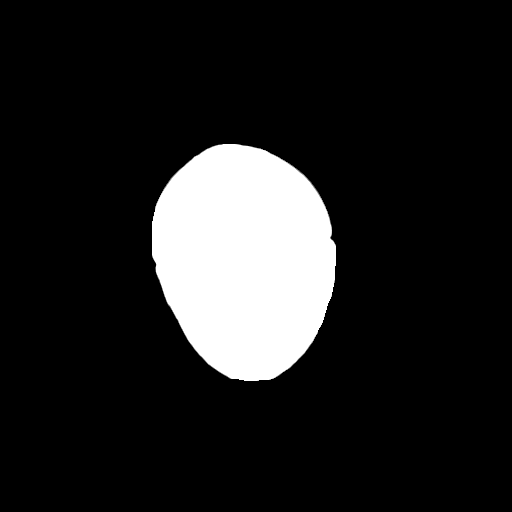
[im 30/33  bone]
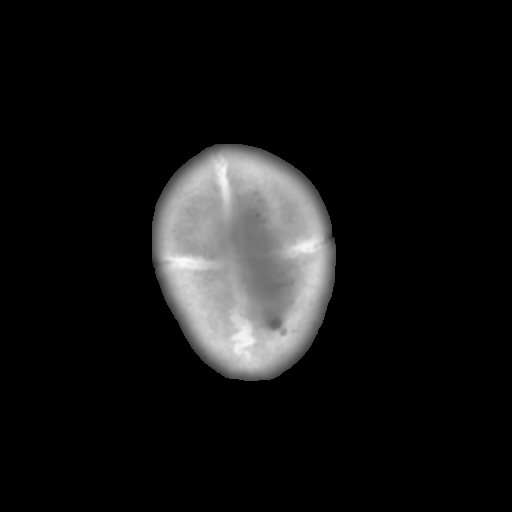

[Series 202: head w/o bone, idose (1) · axial · non-contrast · 0.43mm/px · z∈[+64,+109]mm · 3 of 33 slices shown]
[im 3/33  bone]
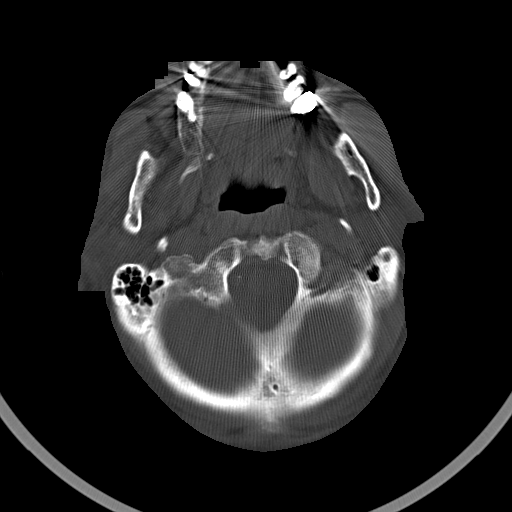
[im 7/33  bone]
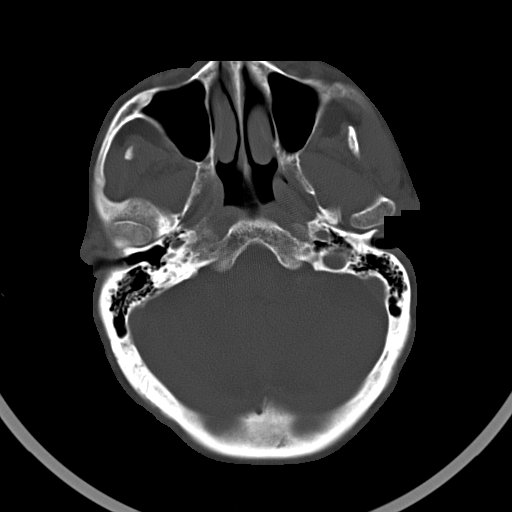
[im 12/33  bone]
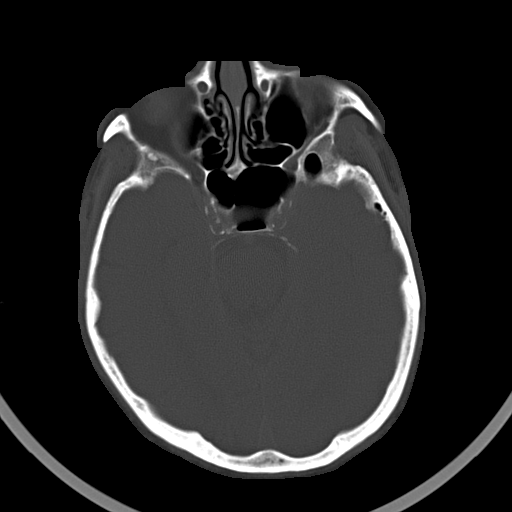

[16 of 30 positions shown; findings below may reference images not displayed]

FINDINGS: There is no evidence of hemorrhage or extra-axial fluid. No
hydrocephalus identified. There is diffuse cortical atrophy, stable.
There is mild low attenuation in the periventricular white matter
which is unchanged as well. Evidence of a tiny lacunar infarct in
the basal ganglia region on the left is stable. There is no evidence
of vascular territory infarct. Calvarium is intact.
IMPRESSION: Age-related atrophy and white matter change with no acute findings.
Critical Value/emergent results were called by telephone at the time
of interpretation on 11/22/2015 at [DATE] to Dr. Guild, who
verbally acknowledged these results.

## 2017-03-10 ENCOUNTER — Telehealth: Payer: Self-pay | Admitting: Cardiovascular Disease

## 2017-03-10 NOTE — Telephone Encounter (Signed)
Received records from [facility] for appointment on 03/14/17 with Dr Allyson Sabal.  Records put with Dr Hazle Coca schedule for 03/14/17. lp

## 2017-03-11 ENCOUNTER — Encounter: Payer: Self-pay | Admitting: Cardiovascular Disease

## 2017-03-11 ENCOUNTER — Ambulatory Visit (INDEPENDENT_AMBULATORY_CARE_PROVIDER_SITE_OTHER): Payer: Medicare Other | Admitting: Cardiovascular Disease

## 2017-03-11 DIAGNOSIS — I10 Essential (primary) hypertension: Secondary | ICD-10-CM | POA: Diagnosis not present

## 2017-03-11 DIAGNOSIS — I482 Chronic atrial fibrillation, unspecified: Secondary | ICD-10-CM

## 2017-03-11 DIAGNOSIS — E7800 Pure hypercholesterolemia, unspecified: Secondary | ICD-10-CM

## 2017-03-11 NOTE — Assessment & Plan Note (Signed)
History of atrial fibrillation status post permanent transvenous pacemaker insertion for bradycardia followed by Dr. Ladona Ridgel. He has had a stroke in the past and is on Pradaxa

## 2017-03-11 NOTE — Assessment & Plan Note (Signed)
History of essential hypertension blood pressure measured 154/87. He is on amlodipine and Diovan which was recently uptitrated. Continue current meds at current dosing

## 2017-03-11 NOTE — Assessment & Plan Note (Signed)
History of hyperlipidemia on statin therapy recently for profile performed 03/07/17 revealing total cholesterol of 114, LDL 69 HDL 37.

## 2017-03-11 NOTE — Addendum Note (Signed)
Addended by: Chana Bode on: 03/11/2017 03:12 PM   Modules accepted: Orders

## 2017-03-11 NOTE — Patient Instructions (Addendum)
Your physician recommends that you schedule a follow-up appointment in: 1 year with Dr. Allyson Sabal.  Follow up with Dr. Ladona Ridgel for a pacemaker check.

## 2017-03-11 NOTE — Progress Notes (Signed)
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 There is no other cardiovascular risk factor history. He has had bradycardia in the past. He says his blood pressure at home. He's had recent heart rates in the 30s and 40s with excessive fatigue and dizziness which is orthostatic. He denies chest pain but does have dyspnea on exertion. He had an event monitor that showed significant bradycardia and ultimately was admitted and underwent permanent pacemaker insertion by [name] on 05/31/14. He had a [employer] DDD permanent pacemaker (serial [id number]) inserted. He had a 2-D echocardiogram performed on 05/06/14 which was essentially normal. His CHA2DSVASC score was 9 and he was properly anticoagulated with a novel oral anticoagulant. He feels symptomatically improved since pacemaker insertion. [name] follows his pacemaker as an outpatient.  He had a TIA earlier last year and was seen by Dr. Leonie Man. CT scan was negative of his head. His Eliquis  changed to Pradaxa . He is underwent speech therapy and had complete resolution of dysarthria but still has some left lower extremity weakness. He does complain of some mild dyspnea on exertion which has not changed in severity. His last 2-D echocardiogram performed 11/20/15 revealed EF of 45-50% with mild aortic stenosis. Unfortunately, his wife of 19 years died on June June 8 th last year. She did have Parkinson's disease. He currently lives alone at [place].   Current Outpatient Prescriptions  Medication Sig Dispense Refill  . alfuzosin  (UROXATRAL) 10 MG 24 hr tablet Take 10 mg by mouth every evening.    [name] amLODipine (NORVASC) 10 MG tablet Take 5 mg by mouth daily.     [name] atorvastatin (LIPITOR) 20 MG tablet Take 20 mg by mouth at bedtime.  6  . Calcium Carbonate-Simethicone (ALKA-SELTZER HEARTBURN + GAS) 750-80 MG CHEW Chew 1-2 each by mouth at bedtime.    . citalopram (CELEXA) 10 MG tablet Take 20 mg by mouth at bedtime.     . Coenzyme Q10 (CO Q-10) 200 MG CAPS Take 1 capsule by mouth daily.     . dabigatran (PRADAXA) 150 MG CAPS capsule Take 1 capsule (150 mg total) by mouth every 12 (twelve) hours. 60 capsule 2  . fluorouracil (EFUDEX) 5 % cream Apply 1 application topically daily as needed (for irritation).    . Incontinent Wash (BALNEOL) LOTN Apply 1 application topically daily as needed (for irritation).     [name] lidocaine (XYLOCAINE) 5 % ointment Apply 1 application topically as needed. (Patient taking differently: Apply 1 application topically as needed for moderate pain (pain). ) 35.44 g 2  . Lidocaine-Hydrocortisone Ace (ANAMANTLE HC) 3-0.5 % KIT Place 1 application rectally daily as needed (for irritation).     . Multiple Vitamin (MULTIVITAMIN) capsule Take 1 capsule by mouth daily.    . NON FORMULARY Place 1 drop into the left eye at bedtime. [employer]S lubricant eye drops left eye only    . NON FORMULARY Place 1 application into the right eye at bedtime. Mura 5% eye ointment in right eye only    . pimecrolimus (ELIDEL) 1 % cream Apply 1 application topically daily  as needed (for face).     . polyethylene glycol (MIRALAX / GLYCOLAX) packet Take 17 g by mouth every morning.    . sitaGLIPtin-metformin (JANUMET) 50-1000 MG per tablet Take 1 tablet by mouth 2 (two) times daily with a meal.    . temazepam (RESTORIL) 15 MG capsule Take 15 mg by mouth at bedtime as needed for sleep.    . valsartan (DIOVAN) 320 MG tablet Take 1 tablet by mouth at bedtime.  3  . vitamin C (ASCORBIC ACID) 500 MG tablet Take 500 mg by mouth daily.    [name]  zinc oxide (BALMEX) 11.3 % CREA cream Apply 1 application topically daily as needed (for rash).     No current facility-administered medications for this visit.     Allergies  Allergen Reactions  . Hctz [Hydrochlorothiazide] Other (See Comments)    Severe reaction  . Zetia [Ezetimibe] Other (See Comments)    LONG TIME AGO    Social History   Social History  . Marital status: Married    Spouse name: N/A  . Number of children: 2  . Years of education: N/A   Occupational History  . retired Optometrist    Social History Main Topics  . Smoking status: Former Smoker    Years: 1.00    Types: Cigarettes  . Smokeless tobacco: Never Used     Comment: "quit smoking in ~ 1952  . Alcohol use Yes     Comment: 05/30/2014 "glass of wine ~ once/month"  . Drug use: No  . Sexual activity: Yes   Other Topics Concern  . Not on file   Social History Narrative  . No narrative on file     Review of Systems: General: negative for chills, fever, night sweats or weight changes.  Cardiovascular: negative for chest pain, dyspnea on exertion, edema, orthopnea, palpitations, paroxysmal nocturnal dyspnea or shortness of breath Dermatological: negative for rash Respiratory: negative for cough or wheezing Urologic: negative for hematuria Abdominal: negative for nausea, vomiting, diarrhea, bright red blood per rectum, melena, or hematemesis Neurologic: negative for visual changes, syncope, or dizziness All other systems reviewed and are otherwise negative except as noted above.    Blood pressure (!) 162/58, pulse 75, height '5\' 10"'$  (1.778 m), weight 176 lb 3.2 oz (79.9 kg).  General appearance: alert and no distress Neck: no adenopathy, no carotid bruit, no JVD, supple, symmetrical, trachea midline and thyroid not enlarged, symmetric, no tenderness/mass/nodules Lungs: clear to auscultation bilaterally Heart: regular rate and rhythm, S1, S2 normal, no murmur, click, rub or gallop Extremities:  Trace bilateral lower extremity edema  EKG atrial fibrillation with demand pacing and inferolateral lateral T-wave inversion. The T-wave inversion is new since previous tracing this. This may be a repolarization abnormality. I personally reviewed this EKG  ASSESSMENT AND PLAN:   Essential hypertension History of essential hypertension blood pressure measured 154/87. He is on amlodipine and Diovan which was recently uptitrated. Continue current meds at current dosing  Hyperlipidemia History of hyperlipidemia on statin therapy recently for profile performed 03/07/17 revealing total cholesterol of 114, LDL 69 HDL 37.  Atrial fibrillation, slow VR and pauses History of atrial fibrillation status post permanent transvenous pacemaker insertion for bradycardia followed by [name]. He has had a stroke in the past and is on [place] MD [place], [facility] 03/11/2017 3:03 PM

## 2017-03-14 ENCOUNTER — Ambulatory Visit: Payer: Medicare Other | Admitting: Cardiovascular Disease

## 2017-03-25 ENCOUNTER — Ambulatory Visit: Payer: Medicare Other | Admitting: Physician Assistant

## 2017-03-28 ENCOUNTER — Ambulatory Visit: Payer: Medicare Other | Admitting: Cardiovascular Disease

## 2017-04-24 ENCOUNTER — Ambulatory Visit (INDEPENDENT_AMBULATORY_CARE_PROVIDER_SITE_OTHER): Payer: Medicare Other | Admitting: Internal Medicine

## 2017-04-24 ENCOUNTER — Encounter: Payer: Self-pay | Admitting: Internal Medicine

## 2017-04-24 VITALS — BP 120/60 | HR 70 | Ht 70.0 in | Wt 173.4 lb

## 2017-04-24 DIAGNOSIS — Z950 Presence of cardiac pacemaker: Secondary | ICD-10-CM

## 2017-04-24 DIAGNOSIS — I482 Chronic atrial fibrillation, unspecified: Secondary | ICD-10-CM

## 2017-04-24 NOTE — Patient Instructions (Signed)
Medication Instructions:  Your physician recommends that you continue on your current medications as directed. Please refer to the Current Medication list given to you today.   Labwork: None Ordered   Testing/Procedures: None Ordered   Follow-Up: Your physician wants you to follow-up in: 1 year with Dr. Taylor. You will receive a reminder letter in the mail two months in advance. If you don't receive a letter, please call our office to schedule the follow-up appointment.  Remote monitoring is used to monitor your Pacemaker  from home. This monitoring reduces the number of office visits required to check your device to one time per year. It allows us to keep an eye on the functioning of your device to ensure it is working properly. You are scheduled for a device check from home on  07/24/17 . You may send your transmission at any time that day. If you have a wireless device, the transmission will be sent automatically. After your physician reviews your transmission, you will receive a postcard with your next transmission date.    Any Other Special Instructions Will Be Listed Below (If Applicable).     If you need a refill on your cardiac medications before your next appointment, please call your pharmacy.   

## 2017-04-24 NOTE — Progress Notes (Signed)
HPI [name] returns today for follow-up. He is a very pleasant 83 year old man with a history of symptomatic sinus node dysfunction and complete heart block, who underwent permanent pacemaker insertion almost 3 years ago. He denies chest pain or shortness of breath. No peripheral edema. He lost his wife of over 81 years last June and has been sad. He is still Emergency planning/management officer as a hobby.  Allergies  Allergen Reactions  . Hctz [Hydrochlorothiazide] Other (See Comments)    Severe reaction  . Zetia [Ezetimibe] Other (See Comments)    LONG TIME AGO     Current Outpatient Prescriptions  Medication Sig Dispense Refill  . alfuzosin (UROXATRAL) 10 MG 24 hr tablet Take 10 mg by mouth every evening.    [name] amLODipine (NORVASC) 10 MG tablet Take 5 mg by mouth daily.     [name] atorvastatin (LIPITOR) 20 MG tablet Take 20 mg by mouth at bedtime.  6  . Calcium Carbonate-Simethicone (ALKA-SELTZER HEARTBURN + GAS) 750-80 MG CHEW Chew 1-2 each by mouth at bedtime.    . citalopram (CELEXA) 10 MG tablet Take 20 mg by mouth at bedtime.     . Coenzyme Q10 (CO Q-10) 200 MG CAPS Take 1 capsule by mouth daily.     . dabigatran (PRADAXA) 150 MG CAPS capsule Take 1 capsule (150 mg total) by mouth every 12 (twelve) hours. 60 capsule 2  . fluorouracil (EFUDEX) 5 % cream Apply 1 application topically daily as needed (for irritation).    [name] lidocaine (XYLOCAINE) 5 % ointment Apply 1 application topically as needed. (Patient taking differently: Apply 1 application topically as needed for moderate pain (pain). ) 35.44 g 2  . Lidocaine-Hydrocortisone Ace (ANAMANTLE HC) 3-0.5 % KIT Place 1 application rectally daily as needed (for irritation).     . Multiple Vitamin (MULTIVITAMIN) capsule Take 1 capsule by mouth daily.    . NON FORMULARY Place 1 drop into the left eye at bedtime. [facility] lubricant eye drops left eye only    . NON FORMULARY Place 1 application into the right eye at bedtime. Mura 5% eye ointment in right  eye only    . pimecrolimus (ELIDEL) 1 % cream Apply 1 application topically daily as needed (for face).     . polyethylene glycol (MIRALAX / GLYCOLAX) packet Take 17 g by mouth every morning.    . sitaGLIPtin-metformin (JANUMET) 50-1000 MG per tablet Take 1 tablet by mouth 2 (two) times daily with a meal.    . valsartan (DIOVAN) 320 MG tablet Take 1 tablet by mouth at bedtime.  3  . zinc oxide (BALMEX) 11.3 % CREA cream Apply 1 application topically daily as needed (for rash).     No current facility-administered medications for this visit.      Past Medical History:  Diagnosis Date  . Anal fissure   . Arthritis    "left pointer" (05/30/2014)  . Atrial fibrillation ([place])   . Bradycardia   . Depression   . Hyperlipidemia   . Hypertension   . Knee pain 2015   "tore tendon left knee; no OR"  . On continuous oral anticoagulation   . Stroke ([place])   . Symptomatic bradycardia    status post dual AV permanent transvenous pacemaker insertion  . Type II diabetes mellitus (HCC)     ROS:   All systems reviewed and negative except as noted in the HPI.   Past Surgical History:  Procedure Laterality Date  . CATARACT EXTRACTION W/  INTRAOCULAR LENS  IMPLANT, BILATERAL Bilateral 2000's  . PACEMAKER INSERTION  05/31/14   STJ dual chamber pacemaker implanted by Dr Lovena Le for symptomatic bradycardia  . PERMANENT PACEMAKER INSERTION N/A 05/31/2014   Procedure: PERMANENT PACEMAKER INSERTION;  Surgeon: Evans Lance, MD;  Location: [facility] CATH LAB;  Service: Cardiovascular;  Laterality: N/A;     Family History  Problem Relation Age of Onset  . Heart attack Neg Hx      Social History   Social History  . Marital status: Married    Spouse name: N/A  . Number of children: 2  . Years of education: N/A   Occupational History  . retired Optometrist    Social History Main Topics  . Smoking status: Former Smoker    Years: 1.00    Types: Cigarettes  . Smokeless tobacco: Never Used      Comment: "quit smoking in ~ 1952  . Alcohol use Yes     Comment: 05/30/2014 "glass of wine ~ once/month"  . Drug use: No  . Sexual activity: Yes   Other Topics Concern  . Not on file   Social History Narrative  . No narrative on file     BP 120/60   Pulse 70   Ht '5\' 10"'$  (1.778 m)   Wt 173 lb 6.4 oz (78.7 kg)   SpO2 97%   BMI 24.88 kg/m   Physical Exam:  Well appearing 83 year old man, NAD HEENT: Unremarkable Neck:  6 cm JVD, no thyromegally Back:  No CVA tenderness Lungs:  Clear with a well-healed pacemaker incision. HEART:  Regular rate rhythm, no murmurs, no rubs, no clicks Abd:  soft, positive bowel sounds, no organomegally, no rebound, no guarding Ext:  2 plus pulses, trace peripheral edema, no cyanosis, no clubbing Skin:  No rashes no nodules Neuro:  CN II through XII intact, motor grossly intact   DEVICE  Normal device function.  See PaceArt for details.   Assess/Plan: 1. PPM - his [employer] DDD PM is working normally. Will recheck in several months. 2. Atrial fib - his ventricular rate is well controlled. Will follow. 3. HTN - his blood pressure is well controlled. He will continue his current meds. 4. Dyslipidemia - his last set of fasting lipids demonstrated an LDL of 71. He will continue his statin therapy.  Mikle Bosworth.D.

## 2017-04-27 LAB — CUP PACEART INCLINIC DEVICE CHECK
Battery Remaining Longevity: 116 mo
Date Time Interrogation Session: 20180614191514
Implantable Lead Implant Date: 20150721
Implantable Lead Location: 753860
Implantable Pulse Generator Implant Date: 20150721
Lead Channel Impedance Value: 462.5 Ohm
Lead Channel Pacing Threshold Amplitude: 1 V
Lead Channel Pacing Threshold Pulse Width: 0.4 ms
Lead Channel Sensing Intrinsic Amplitude: 2.5 mV
MDC IDC LEAD IMPLANT DT: 20150721
MDC IDC LEAD LOCATION: 753859
MDC IDC MSMT BATTERY VOLTAGE: 3.01 V
MDC IDC MSMT LEADCHNL RV IMPEDANCE VALUE: 462.5 Ohm
MDC IDC SET LEADCHNL RV PACING AMPLITUDE: 2.5 V
MDC IDC SET LEADCHNL RV PACING PULSEWIDTH: 0.4 ms
MDC IDC SET LEADCHNL RV SENSING SENSITIVITY: 1.5 mV
MDC IDC STAT BRADY RA PERCENT PACED: 0.02 %
MDC IDC STAT BRADY RV PERCENT PACED: 84 %
Pulse Gen Model: 2240
Pulse Gen Serial Number: 7643490

## 2017-05-23 DIAGNOSIS — H6042 Cholesteatoma of left external ear: Secondary | ICD-10-CM | POA: Insufficient documentation

## 2017-07-24 ENCOUNTER — Ambulatory Visit (INDEPENDENT_AMBULATORY_CARE_PROVIDER_SITE_OTHER): Payer: Medicare Other | Admitting: *Deleted

## 2017-07-24 ENCOUNTER — Telehealth: Payer: Self-pay | Admitting: Cardiology

## 2017-07-24 DIAGNOSIS — R001 Bradycardia, unspecified: Secondary | ICD-10-CM

## 2017-07-24 NOTE — Telephone Encounter (Signed)
LMOVM reminding pt to send remote transmission.   

## 2017-07-25 ENCOUNTER — Encounter: Payer: Self-pay | Admitting: Cardiology

## 2017-07-25 NOTE — Progress Notes (Signed)
Remote pacemaker transmission.   

## 2017-08-04 LAB — CUP PACEART REMOTE DEVICE CHECK
Battery Remaining Longevity: 126 mo
Battery Voltage: 3.02 V
Brady Statistic RV Percent Paced: 86 %
Date Time Interrogation Session: 20180913162316
Implantable Lead Location: 753859
Lead Channel Impedance Value: 460 Ohm
Lead Channel Pacing Threshold Pulse Width: 0.4 ms
Lead Channel Sensing Intrinsic Amplitude: 2.8 mV
Lead Channel Setting Pacing Amplitude: 2.5 V
Lead Channel Setting Pacing Pulse Width: 0.4 ms
MDC IDC LEAD IMPLANT DT: 20150721
MDC IDC LEAD IMPLANT DT: 20150721
MDC IDC LEAD LOCATION: 753860
MDC IDC MSMT BATTERY REMAINING PERCENTAGE: 95.5 %
MDC IDC MSMT LEADCHNL RV PACING THRESHOLD AMPLITUDE: 1 V
MDC IDC PG IMPLANT DT: 20150721
MDC IDC PG SERIAL: 7643490
MDC IDC SET LEADCHNL RV SENSING SENSITIVITY: 1.5 mV
Pulse Gen Model: 2240

## 2017-09-15 ENCOUNTER — Encounter: Payer: Self-pay | Admitting: Neurology

## 2017-09-15 ENCOUNTER — Ambulatory Visit (INDEPENDENT_AMBULATORY_CARE_PROVIDER_SITE_OTHER): Payer: Medicare Other | Admitting: Neurology

## 2017-09-15 VITALS — BP 151/69 | HR 64 | Wt 180.0 lb

## 2017-09-15 DIAGNOSIS — I6990 Unspecified sequelae of unspecified cerebrovascular disease: Secondary | ICD-10-CM | POA: Diagnosis not present

## 2017-09-15 NOTE — Progress Notes (Addendum)
[facility]  PATIENT: Ross Morgan DOB: 1928/01/16   REASON FOR VISIT: Follow-up for stroke with risk factors of atrial fibrillation, hyperlipidemia, hypertension and diabetes HISTORY FROM: Patient   HISTORY OF PRESENT ILLNESS:Ross Morgan, 83 year old male returns for follow-up. He was last seen in the office 12/06/2015 for hospital follow-up after admission 11/22/15 for slurred speech and left facial droop and left foot drop. He has not had further stroke or TIA symptoms. His speech therapy and his physical therapies have concluded. He claims his blood sugars have been up and down mainly because he's been eating junk food. He remains on Pradaxa and his statin medication for secondary stroke prevention. He is back to driving. He has an AFO now to the left lower leg and ambulates with a single-point cane. In the last 3 weeks he's had to place his wife in a skilled facility. He returns for reevaluation   HISTORY 12/06/15 CM Ross. Morgan, 83 year old male returns for hospital follow-up. He was admitted on 11/22/15 and discharged on 11/24/15. He was admitted with slurred speech, left facial droop and left foot drop. He did not receive TPA due to being out of the time window. Review of hospital records.Stroke with nondominant right small infarct too small to be seen on imaging felt to be secondary to known atrial fibrillation. He has permanent pacemaker. 2-D echo ejection fraction 45-50% down from previous 60%. LDL was 71 hemoglobin A1c6.8. For his atrial fibrillation he was switched to Pradaxa and told to continue his statin. He is currently receiving in-home physical therapy 3 times a week and his left-sided symptoms are improving. He is not driving. He is using a walker in the home, he returns for reevaluation. Update 09/04/2016 ; He returns for follow-up after last visit 6 months ago. Continues to do well. He states his speech is improved back to normal. He is tolerating Pradaxa well  without significant bleeding and only occasional minor bruising. He states her blood pressure is usually well controlled and today it is elevated at 154/87 and he blames this on the anxiety related to the visit. His tolerating Lipitor well without muscle aches and pains. He does have lipid profile checks every 6 month by her primary physician at last check it was fine. He continues to drag his left leg a little bit but can walk without assistance and has not had any falls. He has no new complaints today. Update 09/15/2017 ; he returns for follow-up after last visit a year ago.  He continues to do well from stroke standpoint without recurrent TIA or stroke symptoms.  He remains on Pradaxa which is tolerating well without bleeding but he did fall on 05/06/17 when he was running into [employer] and slipped.  He sustained a bruise on his left wrist with torn ligaments but fortunately no fractures.  He saw his primary physician who referred him to outpatient occupational therapy which she has just finished.  He states his blood pressure is usually well controlled with up to date is slightly elevated in the office at 151/69.  He is tolerating Lipitor well without muscle aches or pains and last lipid profile checked by his primary physician during his annual physical visit was satisfactory.  He is currently suffering from pain from anal fissure and seeing his primary physician for the same.  He has no new neurological complaints. REVIEW OF SYSTEMS: Full 14 system review of systems performed and notable only for those listed, all others are neg:   Easy bruising,  activity change, frequency of urination and all systems negative   ALLERGIES: Allergies  Allergen Reactions  . Hctz [Hydrochlorothiazide] Other (See Comments)    Severe reaction  . Zetia [Ezetimibe] Other (See Comments)    LONG TIME AGO    HOME MEDICATIONS: Outpatient Medications Prior to Visit  Medication Sig Dispense Refill  . alfuzosin (UROXATRAL)  10 MG 24 hr tablet Take 10 mg by mouth every evening.    [name] amLODipine (NORVASC) 10 MG tablet Take 5 mg by mouth daily.     [name] atorvastatin (LIPITOR) 20 MG tablet Take 20 mg by mouth at bedtime.  6  . Calcium Carbonate-Simethicone (ALKA-SELTZER HEARTBURN + GAS) 750-80 MG CHEW Chew 1-2 each by mouth at bedtime.    . Calcium Carbonate-Simethicone 750-80 MG CHEW Chew by mouth.    . citalopram (CELEXA) 10 MG tablet Take 20 mg by mouth at bedtime.     . Coenzyme Q10 (CO Q-10) 200 MG CAPS Take 1 capsule by mouth daily.     . dabigatran (PRADAXA) 150 MG CAPS capsule TK 1 C PO Q 12 H    . diclofenac sodium (VOLTAREN) 1 % GEL Apply 4 (four) times daily topically.    . docusate sodium (COLACE) 100 MG capsule Take 100 mg 2 (two) times daily by mouth.    . fluorouracil (EFUDEX) 5 % cream Apply 1 application topically daily as needed (for irritation).    . Lidocaine-Hydrocortisone Ace 3-0.5 % KIT APPLY TO RECTUM TWICE DAILY PRN    . Multiple Vitamin (MULTIVITAMIN) capsule Take 1 capsule by mouth daily.    . NON FORMULARY Place 1 drop into the left eye at bedtime. [facility]S lubricant eye drops left eye only    . NON FORMULARY Place 1 application into the right eye at bedtime. Mura 5% eye ointment in right eye only    . olmesartan-hydrochlorothiazide (BENICAR HCT) 40-12.5 MG tablet Take 1 tablet daily by mouth.    . pimecrolimus (ELIDEL) 1 % cream Apply 1 application topically daily as needed (for face).     . sitaGLIPtin-metformin (JANUMET) 50-1000 MG per tablet Take 1 tablet by mouth 2 (two) times daily with a meal.    . UNABLE TO FIND Diclofenac sodium topical get    . zinc oxide (BALMEX) 11.3 % CREA cream Apply 1 application topically daily as needed (for rash).    . dabigatran (PRADAXA) 150 MG CAPS capsule Take 1 capsule (150 mg total) by mouth every 12 (twelve) hours. 60 capsule 2  . cefdinir (OMNICEF) 300 MG capsule Take 300 mg daily by mouth.    . citalopram (CELEXA) 20 MG tablet TK 1 T PO D    . lidocaine  (XYLOCAINE) 5 % ointment Apply 1 application topically as needed. (Patient not taking: Reported on 09/15/2017) 35.44 g 2  . Lidocaine-Hydrocortisone Ace (ANAMANTLE HC) 3-0.5 % KIT Place 1 application rectally daily as needed (for irritation).     . polyethylene glycol (MIRALAX / GLYCOLAX) packet Take 17 g by mouth every morning.    . Red Yeast Rice Extract (RED YEAST RICE PO) Take 2,400 mg daily by mouth.    . valsartan (DIOVAN) 320 MG tablet Take 1 tablet by mouth at bedtime.  3   No facility-administered medications prior to visit.     PAST MEDICAL HISTORY: Past Medical History:  Diagnosis Date  . Anal fissure   . Arthritis    "left pointer" (05/30/2014)  . Atrial fibrillation ([place])   . Bradycardia   .  Depression   . Hyperlipidemia   . Hypertension   . Knee pain 2015   "tore tendon left knee; no OR"  . On continuous oral anticoagulation   . Stroke ([place])   . Symptomatic bradycardia    status post dual AV permanent transvenous pacemaker insertion  . Type II diabetes mellitus ([place])     PAST SURGICAL HISTORY: Past Surgical History:  Procedure Laterality Date  . CATARACT EXTRACTION W/ INTRAOCULAR LENS  IMPLANT, BILATERAL Bilateral 2000's  . PACEMAKER INSERTION  05/31/14   STJ dual chamber pacemaker implanted by Dr Lovena Le for symptomatic bradycardia    FAMILY HISTORY: Family History  Problem Relation Age of Onset  . Heart attack Neg Hx     SOCIAL HISTORY: Social History   Socioeconomic History  . Marital status: Married    Spouse name: Not on file  . Number of children: 2  . Years of education: Not on file  . Highest education level: Not on file  Social Needs  . Financial resource strain: Not on file  . Food insecurity - worry: Not on file  . Food insecurity - inability: Not on file  . Transportation needs - medical: Not on file  . Transportation needs - non-medical: Not on file  Occupational History  . Occupation: retired Optometrist  Tobacco Use  . Smoking  status: Former Smoker    Years: 1.00    Types: Cigarettes  . Smokeless tobacco: Never Used  . Tobacco comment: "quit smoking in ~ 1952  Substance and Sexual Activity  . Alcohol use: Yes    Comment: 05/30/2014 "glass of wine ~ once/month"  . Drug use: No  . Sexual activity: Yes  Other Topics Concern  . Not on file  Social History Narrative  . Not on file     PHYSICAL EXAM  Vitals:   09/15/17 1047  BP: (!) 151/69  Pulse: 64  Weight: 180 lb (81.6 kg)   Body mass index is 25.83 kg/m. Generalized: Well developed, in no acute distress  Head: normocephalic and atraumatic,. Oropharynx benign  Neck: Supple, no carotid bruits  Cardiac: Regular rate rhythm, no murmur or gallop Lungs clear to auscultation  Musculoskeletal: No deformity   Neurological examination   Mentation: Alert oriented to time, place, history taking. Attention span and concentration appropriate. Recent and remote memory intact. Follows all commands speech and language fluent.   Cranial nerve II-XII: Pupils were equal round reactive to light extraocular movements were full, visual field were full on confrontational test. Facial sensation normal, mild left facial asymmetry. hearing was intact to finger rubbing bilaterally. Uvula tongue midline. head turning and shoulder shrug were normal and symmetric.Tongue protrusion into cheek strength was normal. Motor: normal bulk and tone, full strength in the BUE, BLE, on the right, decreased grip strength on the left mild decreased strength in upper extremity and  4 out of 5 on the left lower extremity.  Mild diminished fine finger movements on the left Sensory: normal and symmetric to light touch, pinprick, and Vibration,  Coordination: finger-nose-finger, heel-to-shin bilaterally, no dysmetria Gait and Station: Rising up from seated position without assistance, wide-based stance, moderate stride, good arm swing, smooth turning,mild left foot drop.Drags left leg  slightly.    DIAGNOSTIC DATA (LABS, IMAGING, TESTING) - I reviewed patient records, labs, notes, testing and imaging myself where available.  Lab Results  Component Value Date   WBC 6.6 11/23/2015   HGB 11.9 (L) 11/23/2015   HCT 35.6 (L) 11/23/2015   MCV 98.9  11/23/2015   PLT 115 (L) 11/23/2015      Component Value Date/Time   NA 139 11/24/2015 0548   K 4.0 11/24/2015 0548   CL 106 11/24/2015 0548   CO2 25 11/24/2015 0548   GLUCOSE 109 (H) 11/24/2015 0548   BUN 16 11/24/2015 0548   CREATININE 1.15 11/24/2015 0548   CALCIUM 8.6 (L) 11/24/2015 0548   PROT 7.0 11/22/2015 1656   ALBUMIN 3.8 11/22/2015 1656   AST 22 11/22/2015 1656   ALT 19 11/22/2015 1656   ALKPHOS 78 11/22/2015 1656   BILITOT 0.7 11/22/2015 1656   GFRNONAA 56 (L) 11/24/2015 0548   GFRAA >60 11/24/2015 0548   Lab Results  Component Value Date   CHOL 109 11/20/2015   HDL 28 (L) 11/20/2015   LDLCALC 71 11/20/2015   TRIG 52 11/20/2015   CHOLHDL 3.9 11/20/2015   Lab Results  Component Value Date   HGBA1C 6.8 (H) 11/24/2015    ASSESSMENT AND PLAN 83 y.o. year old male has a past medical history of Hypertension; Hyperlipidemia; Bradycardia; Atrial fibrillation ([place]); Type II diabetes mellitus ([place]); Arthritis; and nondominant right small infarct to small be seen on imaging. Infarct felt to be embolic due to known history of atrial fibrillation. Was on Eliquis on admission switched to pradaxa and doing well   I had a long d/w patient about his remote stroke,atrial fibrillation risk for recurrent stroke/TIAs, personally independently reviewed imaging studies and stroke evaluation results and answered questions.Continue Pradaxa (dabigatran) twice a day  for secondary stroke prevention and maintain strict control of hypertension with blood pressure goal below 130/90, diabetes with hemoglobin A1c goal below 6.5% and lipids with LDL cholesterol goal below 70 mg/dL. I also advised the patient to eat a healthy  diet with plenty of whole grains, cereals, fruits and vegetables, exercise regularly and maintain ideal body weight .We also discussed fall risk precautions.No scheduled followup in the future with meiis necessary but he may be referred back by his primary MD as needed Greater than 50% time during this 25 minute visit was spent on counseling and coordination of care about stroke risk, prevention and treatment  Antony Contras, MD [facility] [address], [place], [place] [address] [phone]

## 2017-09-15 NOTE — Patient Instructions (Signed)
I had a long d/w patient about his remote stroke,atrial fibrillation risk for recurrent stroke/TIAs, personally independently reviewed imaging studies and stroke evaluation results and answered questions.Continue Pradaxa (dabigatran) twice a day  for secondary stroke prevention and maintain strict control of hypertension with blood pressure goal below 130/90, diabetes with hemoglobin A1c goal below 6.5% and lipids with LDL cholesterol goal below 70 mg/dL. I also advised the patient to eat a healthy diet with plenty of whole grains, cereals, fruits and vegetables, exercise regularly and maintain ideal body weight .We also discussed fall risk precautions.No scheduled followup in the future with meiis necessary but he may be referred back by his primary MD as needed Fall Prevention in the Home Falls can cause injuries and can affect people from all age groups. There are many simple things that you can do to make your home safe and to help prevent falls. What can I do on the outside of my home?  Regularly repair the edges of walkways and driveways and fix any cracks.  Remove high doorway thresholds.  Trim any shrubbery on the main path into your home.  Use bright outdoor lighting.  Clear walkways of debris and clutter, including tools and rocks.  Regularly check that handrails are securely fastened and in good repair. Both sides of any steps should have handrails.  Install guardrails along the edges of any raised decks or porches.  Have leaves, snow, and ice cleared regularly.  Use sand or salt on walkways during winter months.  In the garage, clean up any spills right away, including grease or oil spills. What can I do in the bathroom?  Use night lights.  Install grab bars by the toilet and in the tub and shower. Do not use towel bars as grab bars.  Use non-skid mats or decals on the floor of the tub or shower.  If you need to sit down while you are in the shower, use a plastic, non-slip  stool.  Keep the floor dry. Immediately clean up any water that spills on the floor.  Remove soap buildup in the tub or shower on a regular basis.  Attach bath mats securely with double-sided non-slip rug tape.  Remove throw rugs and other tripping hazards from the floor. What can I do in the bedroom?  Use night lights.  Make sure that a bedside light is easy to reach.  Do not use oversized bedding that drapes onto the floor.  Have a firm chair that has side arms to use for getting dressed.  Remove throw rugs and other tripping hazards from the floor. What can I do in the kitchen?  Clean up any spills right away.  Avoid walking on wet floors.  Place frequently used items in easy-to-reach places.  If you need to reach for something above you, use a sturdy step stool that has a grab bar.  Keep electrical cables out of the way.  Do not use floor polish or wax that makes floors slippery. If you have to use wax, make sure that it is non-skid floor wax.  Remove throw rugs and other tripping hazards from the floor. What can I do in the stairways?  Do not leave any items on the stairs.  Make sure that there are handrails on both sides of the stairs. Fix handrails that are broken or loose. Make sure that handrails are as long as the stairways.  Check any carpeting to make sure that it is firmly attached to the stairs. Fix  any carpet that is loose or worn.  Avoid having throw rugs at the top or bottom of stairways, or secure the rugs with carpet tape to prevent them from moving.  Make sure that you have a light switch at the top of the stairs and the bottom of the stairs. If you do not have them, have them installed. What are some other fall prevention tips?  Wear closed-toe shoes that fit well and support your feet. Wear shoes that have rubber soles or low heels.  When you use a stepladder, make sure that it is completely opened and that the sides are firmly locked. Have  someone hold the ladder while you are using it. Do not climb a closed stepladder.  Add color or contrast paint or tape to grab bars and handrails in your home. Place contrasting color strips on the first and last steps.  Use mobility aids as needed, such as canes, walkers, scooters, and crutches.  Turn on lights if it is dark. Replace any light bulbs that burn out.  Set up furniture so that there are clear paths. Keep the furniture in the same spot.  Fix any uneven floor surfaces.  Choose a carpet design that does not hide the edge of steps of a stairway.  Be aware of any and all pets.  Review your medicines with your healthcare provider. Some medicines can cause dizziness or changes in blood pressure, which increase your risk of falling. Talk with your health care provider about other ways that you can decrease your risk of falls. This may include working with a physical therapist or trainer to improve your strength, balance, and endurance. This information is not intended to replace advice given to you by your health care provider. Make sure you discuss any questions you have with your health care provider. Document Released: 10/18/2002 Document Revised: 03/26/2016 Document Reviewed: 12/02/2014 Elsevier Interactive Patient Education  2017 [employer].

## 2017-10-23 ENCOUNTER — Ambulatory Visit (INDEPENDENT_AMBULATORY_CARE_PROVIDER_SITE_OTHER): Payer: Medicare Other | Admitting: *Deleted

## 2017-10-23 DIAGNOSIS — R001 Bradycardia, unspecified: Secondary | ICD-10-CM

## 2017-10-23 NOTE — Progress Notes (Signed)
Remote pacemaker transmission.   

## 2017-10-28 ENCOUNTER — Encounter: Payer: Self-pay | Admitting: Cardiology

## 2017-11-21 LAB — CUP PACEART REMOTE DEVICE CHECK
Battery Remaining Longevity: 125 mo
Battery Remaining Percentage: 95.5 %
Battery Voltage: 3.02 V
Brady Statistic RV Percent Paced: 88 %
Date Time Interrogation Session: 20181213100107
Implantable Lead Implant Date: 20150721
Implantable Lead Implant Date: 20150721
Implantable Lead Location: 753859
Implantable Lead Location: 753860
Implantable Pulse Generator Implant Date: 20150721
Lead Channel Impedance Value: 450 Ohm
Lead Channel Pacing Threshold Amplitude: 1 V
Lead Channel Sensing Intrinsic Amplitude: 2.8 mV
Lead Channel Setting Pacing Amplitude: 2.5 V
MDC IDC MSMT LEADCHNL RV PACING THRESHOLD PULSEWIDTH: 0.4 ms
MDC IDC SET LEADCHNL RV PACING PULSEWIDTH: 0.4 ms
MDC IDC SET LEADCHNL RV SENSING SENSITIVITY: 1.5 mV
Pulse Gen Serial Number: 7643490

## 2018-01-22 ENCOUNTER — Ambulatory Visit (INDEPENDENT_AMBULATORY_CARE_PROVIDER_SITE_OTHER): Payer: Medicare Other | Admitting: *Deleted

## 2018-01-22 DIAGNOSIS — R001 Bradycardia, unspecified: Secondary | ICD-10-CM | POA: Diagnosis not present

## 2018-01-22 NOTE — Progress Notes (Signed)
Remote pacemaker transmission.   

## 2018-01-23 ENCOUNTER — Encounter: Payer: Self-pay | Admitting: Cardiology

## 2018-01-23 NOTE — Progress Notes (Signed)
Letter  

## 2018-02-08 LAB — CUP PACEART REMOTE DEVICE CHECK
Battery Remaining Longevity: 125 mo
Battery Remaining Percentage: 95.5 %
Battery Voltage: 3.01 V
Brady Statistic RV Percent Paced: 89 %
Date Time Interrogation Session: 20190314090014
Implantable Lead Implant Date: 20150721
Implantable Lead Implant Date: 20150721
Implantable Lead Location: 753859
Implantable Lead Location: 753860
Implantable Pulse Generator Implant Date: 20150721
Lead Channel Impedance Value: 450 Ohm
Lead Channel Pacing Threshold Amplitude: 1 V
Lead Channel Pacing Threshold Pulse Width: 0.4 ms
Lead Channel Sensing Intrinsic Amplitude: 2.1 mV
Lead Channel Setting Pacing Amplitude: 2.5 V
Lead Channel Setting Pacing Pulse Width: 0.4 ms
Lead Channel Setting Sensing Sensitivity: 1.5 mV
Pulse Gen Model: 2240
Pulse Gen Serial Number: 7643490

## 2018-03-24 DIAGNOSIS — H903 Sensorineural hearing loss, bilateral: Secondary | ICD-10-CM | POA: Insufficient documentation

## 2018-04-23 ENCOUNTER — Telehealth: Payer: Self-pay | Admitting: Cardiology

## 2018-04-23 ENCOUNTER — Ambulatory Visit (INDEPENDENT_AMBULATORY_CARE_PROVIDER_SITE_OTHER): Payer: Medicare Other | Admitting: *Deleted

## 2018-04-23 ENCOUNTER — Encounter: Payer: Self-pay | Admitting: Cardiology

## 2018-04-23 DIAGNOSIS — R001 Bradycardia, unspecified: Secondary | ICD-10-CM | POA: Diagnosis not present

## 2018-04-23 NOTE — Progress Notes (Signed)
Remote pacemaker transmission.   

## 2018-04-23 NOTE — Telephone Encounter (Signed)
Spoke with pt and reminded pt of remote transmission that is due today. Pt verbalized understanding.   

## 2018-05-15 LAB — CUP PACEART REMOTE DEVICE CHECK
Brady Statistic RV Percent Paced: 89 %
Date Time Interrogation Session: 20190613180214
Implantable Lead Implant Date: 20150721
Implantable Lead Implant Date: 20150721
Implantable Lead Location: 753860
Implantable Pulse Generator Implant Date: 20150721
Lead Channel Impedance Value: 440 Ohm
Lead Channel Pacing Threshold Amplitude: 1 V
Lead Channel Pacing Threshold Pulse Width: 0.4 ms
Lead Channel Sensing Intrinsic Amplitude: 1.9 mV
Lead Channel Setting Pacing Amplitude: 2.5 V
Lead Channel Setting Pacing Pulse Width: 0.4 ms
MDC IDC LEAD LOCATION: 753859
MDC IDC MSMT BATTERY REMAINING LONGEVITY: 124 mo
MDC IDC MSMT BATTERY REMAINING PERCENTAGE: 95.5 %
MDC IDC MSMT BATTERY VOLTAGE: 3.01 V
MDC IDC SET LEADCHNL RV SENSING SENSITIVITY: 1.5 mV
Pulse Gen Model: 2240
Pulse Gen Serial Number: 7643490

## 2018-05-18 ENCOUNTER — Encounter: Payer: Self-pay | Admitting: Internal Medicine

## 2018-06-01 ENCOUNTER — Encounter: Payer: Self-pay | Admitting: Internal Medicine

## 2018-06-01 ENCOUNTER — Ambulatory Visit (INDEPENDENT_AMBULATORY_CARE_PROVIDER_SITE_OTHER): Payer: Medicare Other | Admitting: Internal Medicine

## 2018-06-01 VITALS — BP 114/68 | HR 64 | Ht 70.0 in | Wt 168.6 lb

## 2018-06-01 DIAGNOSIS — I482 Chronic atrial fibrillation, unspecified: Secondary | ICD-10-CM

## 2018-06-01 DIAGNOSIS — Z950 Presence of cardiac pacemaker: Secondary | ICD-10-CM

## 2018-06-01 DIAGNOSIS — R001 Bradycardia, unspecified: Secondary | ICD-10-CM

## 2018-06-01 LAB — CUP PACEART INCLINIC DEVICE CHECK
Battery Voltage: 3.01 V
Brady Statistic RA Percent Paced: 0 %
Date Time Interrogation Session: 20190722171127
Implantable Lead Implant Date: 20150721
Implantable Lead Implant Date: 20150721
Implantable Lead Location: 753859
Lead Channel Impedance Value: 437.5 Ohm
Lead Channel Pacing Threshold Amplitude: 1.25 V
Lead Channel Pacing Threshold Pulse Width: 0.4 ms
Lead Channel Sensing Intrinsic Amplitude: 11.7 mV
Lead Channel Setting Pacing Pulse Width: 0.4 ms
Lead Channel Setting Sensing Sensitivity: 6 mV
MDC IDC LEAD LOCATION: 753860
MDC IDC MSMT BATTERY REMAINING LONGEVITY: 118 mo
MDC IDC PG IMPLANT DT: 20150721
MDC IDC SET LEADCHNL RV PACING AMPLITUDE: 2.5 V
MDC IDC STAT BRADY RV PERCENT PACED: 89 %
Pulse Gen Serial Number: 7643490

## 2018-06-01 NOTE — Patient Instructions (Signed)
Medication Instructions: No changes made today  Labwork: None ordered today  Procedures/Testing: None ordered today  Follow-Up: 1 year with Dr. Ladona Ridgelaylor  Any Additional Special Instructions Will Be Listed Below (If Applicable).     If you need a refill on your cardiac medications before your next appointment, please call your pharmacy.

## 2018-06-01 NOTE — Progress Notes (Signed)
HPI Mr. Mazzie returns today for follow-up. He is a very pleasant 84 year old man with a history of symptomatic sinus node dysfunction and complete heart block, who underwent permanent pacemaker insertion almost 4 years ago. He denies chest pain or shortness of breath. No peripheral edema. He is still Emergency planning/management officer as a hobby. He has had some reduced R waves.   Allergies  Allergen Reactions  . Hctz [Hydrochlorothiazide] Other (See Comments)    Severe reaction  . Zetia [Ezetimibe] Other (See Comments)    LONG TIME AGO     Current Outpatient Medications  Medication Sig Dispense Refill  . alfuzosin (UROXATRAL) 10 MG 24 hr tablet Take 10 mg by mouth every evening.    [name] amLODipine (NORVASC) 10 MG tablet Take 5 mg by mouth daily.     [name] atorvastatin (LIPITOR) 20 MG tablet Take 20 mg by mouth at bedtime.  6  . Calcium Carbonate-Simethicone (ALKA-SELTZER HEARTBURN + GAS) 750-80 MG CHEW Chew 1-2 each by mouth at bedtime.    . Calcium Carbonate-Simethicone 750-80 MG CHEW Chew by mouth.    . citalopram (CELEXA) 10 MG tablet Take 20 mg by mouth at bedtime.     . Coenzyme Q10 (CO Q-10) 200 MG CAPS Take 1 capsule by mouth daily.     . dabigatran (PRADAXA) 150 MG CAPS capsule TK 1 C PO Q 12 H    . diclofenac sodium (VOLTAREN) 1 % GEL Apply 4 (four) times daily topically.    . docusate sodium (COLACE) 100 MG capsule Take 100 mg 2 (two) times daily by mouth.    . fluorouracil (EFUDEX) 5 % cream Apply 1 application topically daily as needed (for irritation).    . Lidocaine-Hydrocortisone Ace 3-0.5 % KIT APPLY TO RECTUM TWICE DAILY PRN    . Multiple Vitamin (MULTIVITAMIN) capsule Take 1 capsule by mouth daily.    . NON FORMULARY Place 1 drop into the left eye at bedtime. [facility] lubricant eye drops left eye only    . NON FORMULARY Place 1 application into the right eye at bedtime. Mura 5% eye ointment in right eye only    . olmesartan-hydrochlorothiazide (BENICAR HCT) 40-12.5 MG tablet Take 1  tablet daily by mouth.    . pimecrolimus (ELIDEL) 1 % cream Apply 1 application topically daily as needed (for face).     . sitaGLIPtin-metformin (JANUMET) 50-1000 MG per tablet Take 1 tablet by mouth 2 (two) times daily with a meal.    . UNABLE TO FIND Diclofenac sodium topical get    . zinc oxide (BALMEX) 11.3 % CREA cream Apply 1 application topically daily as needed (for rash).     No current facility-administered medications for this visit.      Past Medical History:  Diagnosis Date  . Anal fissure   . Arthritis    "left pointer" (05/30/2014)  . Atrial fibrillation ([place])   . Bradycardia   . Depression   . Hyperlipidemia   . Hypertension   . Knee pain 2015   "tore tendon left knee; no OR"  . On continuous oral anticoagulation   . Stroke ([place])   . Symptomatic bradycardia    status post dual AV permanent transvenous pacemaker insertion  . Type II diabetes mellitus ([facility])     ROS:   All systems reviewed and negative except as noted in the HPI.   Past Surgical History:  Procedure Laterality Date  . CATARACT EXTRACTION W/ INTRAOCULAR LENS  IMPLANT, BILATERAL Bilateral 2000's  .  PACEMAKER INSERTION  05/31/14   STJ dual chamber pacemaker implanted by Dr Lovena Le for symptomatic bradycardia  . PERMANENT PACEMAKER INSERTION N/A 05/31/2014   Procedure: PERMANENT PACEMAKER INSERTION;  Surgeon: Evans Lance, MD;  Location: [facility] CATH LAB;  Service: Cardiovascular;  Laterality: N/A;     Family History  Problem Relation Age of Onset  . Heart attack Neg Hx      Social History   Socioeconomic History  . Marital status: Married    Spouse name: Not on file  . Number of children: 2  . Years of education: Not on file  . Highest education level: Not on file  Occupational History  . Occupation: retired Optometrist  Social Needs  . Financial resource strain: Not on file  . Food insecurity:    Worry: Not on file    Inability: Not on file  . Transportation needs:    Medical:  Not on file    Non-medical: Not on file  Tobacco Use  . Smoking status: Former Smoker    Years: 1.00    Types: Cigarettes  . Smokeless tobacco: Never Used  . Tobacco comment: "quit smoking in ~ 1952  Substance and Sexual Activity  . Alcohol use: Yes    Comment: 05/30/2014 "glass of wine ~ once/month"  . Drug use: No  . Sexual activity: Yes  Lifestyle  . Physical activity:    Days per week: Not on file    Minutes per session: Not on file  . Stress: Not on file  Relationships  . Social connections:    Talks on phone: Not on file    Gets together: Not on file    Attends religious service: Not on file    Active member of club or organization: Not on file    Attends meetings of clubs or organizations: Not on file    Relationship status: Not on file  . Intimate partner violence:    Fear of current or ex partner: Not on file    Emotionally abused: Not on file    Physically abused: Not on file    Forced sexual activity: Not on file  Other Topics Concern  . Not on file  Social History Narrative  . Not on file     Ht 5' 10" (1.778 m)   BMI 25.83 kg/m BP - 114/68, P- 64  Physical Exam:  Well appearing elderly man, NAD HEENT: Unremarkable Neck:  6 cm JVD, no thyromegally Lymphatics:  No adenopathy Back:  No CVA tenderness Lungs:  Clear with no wheezes HEART:  Regular rate rhythm, no murmurs, no rubs, no clicks Abd:  soft, positive bowel sounds, no organomegally, no rebound, no guarding Ext:  2 plus pulses, no edema, no cyanosis, no clubbing Skin:  No rashes no nodules Neuro:  CN II through XII intact, motor grossly intact  EKG - atrial fib with ventricular pacing and sensing  DEVICE  Normal device function.  See PaceArt for details.   Assess/Plan: 1. Atrial fib - his ventricular rate is well controlled. 2. PPM - interogation of his device today demonstrates bipolar R waves of 6.5 (was 3) and unipolar R waves of 11. I have left him programmed unipolar sensing at  6. 3. HTN - his blood pressure today is good. NO change in meds.  Ross Morgan.D

## 2018-06-02 ENCOUNTER — Telehealth: Payer: Self-pay | Admitting: Internal Medicine

## 2018-06-02 ENCOUNTER — Encounter: Payer: Self-pay | Admitting: Gastroenterology

## 2018-06-02 NOTE — Telephone Encounter (Signed)
Pt was seen yesterday in [facility]. Pt  states that Dr. Lubertha Basqueaylor's nurse asked him to called today to let her know what king of Olmesartan he is  Taking.  Pt states he is taking(Olmesartan Medoxomil 40 mg) message will be send to Ancil BoozerJenny Smith Dr. Lubertha Basqueaylor's nurse. Pt verbalized understanding.

## 2018-06-02 NOTE — Telephone Encounter (Signed)
Pt calling and stating he was seen in office yesterday and was told by a nurse to call back to discuss his medications. Please call pt

## 2018-06-09 NOTE — Telephone Encounter (Signed)
noted 

## 2018-06-16 ENCOUNTER — Encounter: Payer: Self-pay | Admitting: Cardiovascular Disease

## 2018-06-16 ENCOUNTER — Ambulatory Visit (INDEPENDENT_AMBULATORY_CARE_PROVIDER_SITE_OTHER): Payer: Medicare Other | Admitting: Cardiovascular Disease

## 2018-06-16 DIAGNOSIS — I10 Essential (primary) hypertension: Secondary | ICD-10-CM

## 2018-06-16 DIAGNOSIS — E7800 Pure hypercholesterolemia, unspecified: Secondary | ICD-10-CM | POA: Diagnosis not present

## 2018-06-16 DIAGNOSIS — I482 Chronic atrial fibrillation, unspecified: Secondary | ICD-10-CM

## 2018-06-16 NOTE — Assessment & Plan Note (Signed)
History of hyperlipidemia on statin therapy with lipid profile performed 03/06/2018 really a total cholesterol of 114, LDL 72 and HDL of 33.

## 2018-06-16 NOTE — Progress Notes (Signed)
06/16/2018 Ross Morgan   1927-11-24  [id number]  Primary Physician Marton Redwood, MD Primary Cardiologist: Lorretta Harp MD Lupe Carney, [place]  HPI:  Ross Morgan is a 84 y.o.  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 There is no other cardiovascular risk factor history. He has had bradycardia in the past. He says his blood pressure at home. He's had recent heart rates in the 30s and 40s with excessive fatigue and dizziness which is orthostatic. He denies chest pain but does have dyspnea on exertion. He had an event monitor that showed significant bradycardia and ultimately was admitted and underwent permanent pacemaker insertion by [name] on 05/31/14. He had a St. Jude DDD permanent pacemaker (serial [id number]) inserted. He had a 2-D echocardiogram performed on 05/06/14 which was essentially normal. His CHA2DSVASC score was 9 and he was properly anticoagulated with a novel oral anticoagulant. He feels symptomatically improved since pacemaker insertion. [name] follows his pacemaker as an outpatient.  He had a TIA earlier last year and was seen by Dr. Leonie Man. CT scan was negative of his head. His Eliquis changed to Pradaxa . He is underwent speech therapy and had complete resolution of dysarthria but still has some left lower extremity weakness. He does complain of some mild dyspnea on exertion which has not changed in severity. His last 2-D echocardiogram performed 11/20/15 revealed EF of 45-50% with mild aortic stenosis. Unfortunately, his wife of 41 years died on 2016/05/13 she did have Parkinson's disease. He currently lives alone at [address].  Since I saw him a year ago he is done well.  He denies chest pain or shortness of breath.  He continues to  practice his hobby of furniture making.     Current Meds  Medication Sig  . alfuzosin (UROXATRAL) 10 MG 24 hr tablet Take 10 mg by mouth every evening.  Ross Morgan amLODipine (NORVASC) 5 MG tablet Take 5 mg by mouth daily.  Ross Morgan atorvastatin (LIPITOR) 20 MG tablet Take 20 mg by mouth at bedtime.  . Calcium Carbonate-Simethicone (ALKA-SELTZER HEARTBURN + GAS) 750-80 MG CHEW Chew 1-2 each by mouth at bedtime.  . citalopram (CELEXA) 10 MG tablet Take 20 mg by mouth at bedtime.   . Coenzyme Q10 (CO Q-10) 200 MG CAPS Take 1 capsule by mouth daily.   . dabigatran (PRADAXA) 150 MG CAPS capsule Take 150 mg by mouth 2 (two) times daily.  Ross Morgan diltiazem 2 % GEL Apply 1 application topically 2 (two) times daily.  . fluorouracil (EFUDEX) 5 % cream Apply 1 application topically daily as needed (for irritation).  . Lidocaine-Hydrocortisone Ace 3-0.5 % KIT APPLY TO RECTUM TWICE DAILY PRN  . Multiple Vitamin (MULTIVITAMIN) capsule Take 1 capsule by mouth daily.  . NON FORMULARY Place 1 drop into the left eye at bedtime. [facility]S lubricant eye drops left eye only  . olmesartan (BENICAR) 40 MG tablet Take 40 mg by mouth daily.  . pimecrolimus (ELIDEL) 1 % cream Apply 1 application topically daily as needed (for face).   . sitaGLIPtin-metformin (JANUMET) 50-1000 MG per tablet Take 1 tablet by mouth 2 (two) times daily with a meal.  . zinc oxide (BALMEX) 11.3 % CREA cream Apply 1 application topically daily as needed (for rash).  . [DISCONTINUED] amLODipine (  NORVASC) 10 MG tablet Take 5 mg by mouth daily.   . [DISCONTINUED] olmesartan-hydrochlorothiazide (BENICAR HCT) 40-12.5 MG tablet Take 1 tablet daily by mouth.     Allergies  Allergen Reactions  . Hctz [Hydrochlorothiazide] Other (See Comments)    Severe reaction  . Zetia [Ezetimibe] Other (See Comments)    LONG TIME AGO    Social History   Socioeconomic History  . Marital status: Married    Spouse name: Not on file  . Number of children: 2  . Years of  education: Not on file  . Highest education level: Not on file  Occupational History  . Occupation: retired Optometrist  Social Needs  . Financial resource strain: Not on file  . Food insecurity:    Worry: Not on file    Inability: Not on file  . Transportation needs:    Medical: Not on file    Non-medical: Not on file  Tobacco Use  . Smoking status: Former Smoker    Years: 1.00    Types: Cigarettes  . Smokeless tobacco: Never Used  . Tobacco comment: "quit smoking in ~ 1952  Substance and Sexual Activity  . Alcohol use: Yes    Comment: 05/30/2014 "glass of wine ~ once/month"  . Drug use: No  . Sexual activity: Yes  Lifestyle  . Physical activity:    Days per week: Not on file    Minutes per session: Not on file  . Stress: Not on file  Relationships  . Social connections:    Talks on phone: Not on file    Gets together: Not on file    Attends religious service: Not on file    Active member of club or organization: Not on file    Attends meetings of clubs or organizations: Not on file    Relationship status: Not on file  . Intimate partner violence:    Fear of current or ex partner: Not on file    Emotionally abused: Not on file    Physically abused: Not on file    Forced sexual activity: Not on file  Other Topics Concern  . Not on file  Social History Narrative  . Not on file     Review of Systems: General: negative for chills, fever, night sweats or weight changes.  Cardiovascular: negative for chest pain, dyspnea on exertion, edema, orthopnea, palpitations, paroxysmal nocturnal dyspnea or shortness of breath Dermatological: negative for rash Respiratory: negative for cough or wheezing Urologic: negative for hematuria Abdominal: negative for nausea, vomiting, diarrhea, bright red blood per rectum, melena, or hematemesis Neurologic: negative for visual changes, syncope, or dizziness All other systems reviewed and are otherwise negative except as noted  above.    Blood pressure 136/74, pulse 66, height _0  (1.778 m), weight 168 lb (76.2 kg).  General appearance: alert and no distress Neck: no adenopathy, no carotid bruit, no JVD, supple, symmetrical, trachea midline and thyroid not enlarged, symmetric, no tenderness/mass/nodules Lungs: clear to auscultation bilaterally Heart: regular rate and rhythm, S1, S2 normal, no murmur, click, rub or gallop Extremities: extremities normal, atraumatic, no cyanosis or edema Pulses: 2+ and symmetric Skin: Skin color, texture, turgor normal. No rashes or lesions Neurologic: Alert and oriented X 3, normal strength and tone. Normal symmetric reflexes. Normal coordination and gait  EKG not performed today  ASSESSMENT AND PLAN:   Essential hypertension History of essential hypertension with blood pressure measured today at 136/74.  He is on amlodipine and olmesartan.  Continue current meds at  current dosing.  Hyperlipidemia History of hyperlipidemia on statin therapy with lipid profile performed 03/06/2018 really a total cholesterol of 114, LDL 72 and HDL of 33.  Atrial fibrillation, slow VR and pauses History of chronic A. fib on Pradaxa status post permanent transvenous pacemaker insertion followed by [name].      Lorretta Harp MD FACP,FACC,FAHA, [facility] 06/16/2018 2:40 PM

## 2018-06-16 NOTE — Assessment & Plan Note (Signed)
History of chronic A. fib on Pradaxa status post permanent transvenous pacemaker insertion followed by Dr. Ladona Ridgelaylor.

## 2018-06-16 NOTE — Patient Instructions (Signed)

## 2018-06-16 NOTE — Assessment & Plan Note (Signed)
History of essential hypertension with blood pressure measured today at 136/74.  He is on amlodipine and olmesartan.  Continue current meds at current dosing.

## 2018-07-23 ENCOUNTER — Telehealth: Payer: Self-pay

## 2018-07-23 ENCOUNTER — Ambulatory Visit (INDEPENDENT_AMBULATORY_CARE_PROVIDER_SITE_OTHER): Payer: Medicare Other | Admitting: *Deleted

## 2018-07-23 DIAGNOSIS — I482 Chronic atrial fibrillation, unspecified: Secondary | ICD-10-CM

## 2018-07-23 NOTE — Telephone Encounter (Signed)
Spoke with pt and reminded pt of remote transmission that is due today. Pt verbalized understanding.   

## 2018-07-23 NOTE — Progress Notes (Signed)
Remote pacemaker transmission.   

## 2018-08-03 ENCOUNTER — Encounter: Payer: Self-pay | Admitting: Gastroenterology

## 2018-08-03 ENCOUNTER — Ambulatory Visit (INDEPENDENT_AMBULATORY_CARE_PROVIDER_SITE_OTHER): Payer: Medicare Other | Admitting: Gastroenterology

## 2018-08-03 ENCOUNTER — Other Ambulatory Visit (INDEPENDENT_AMBULATORY_CARE_PROVIDER_SITE_OTHER): Payer: Medicare Other

## 2018-08-03 VITALS — BP 110/58 | HR 78 | Ht 70.0 in | Wt 169.0 lb

## 2018-08-03 DIAGNOSIS — K6289 Other specified diseases of anus and rectum: Secondary | ICD-10-CM

## 2018-08-03 DIAGNOSIS — K625 Hemorrhage of anus and rectum: Secondary | ICD-10-CM

## 2018-08-03 DIAGNOSIS — K602 Anal fissure, unspecified: Secondary | ICD-10-CM | POA: Diagnosis not present

## 2018-08-03 LAB — HEMOGLOBIN: HEMOGLOBIN: 13.1 g/dL (ref 13.0–17.0)

## 2018-08-03 LAB — HEMATOCRIT: HCT: 39 % (ref 39.0–52.0)

## 2018-08-03 MED ORDER — AMBULATORY NON FORMULARY MEDICATION: 1 refills | Status: DC

## 2018-08-03 NOTE — Patient Instructions (Addendum)
We have given you a written prescription of Nitroglycerin ointment to take to [facility], use this prescription alon with the over the counter medication Recticare 3-4 times daily for 6-8 weeks   Take Benefiber 1 teaspoon three times a day with meals  AVOID applying hydrocortisone per rectum   Go to the basement for labs today  You have a follow up appointment with Willette ClusterPaula Guenther NP on 08/26/2018 at 1:30pm   If you are age 82 or older, your body mass index should be between 23-30. Your Body mass index is 24.25 kg/m. If this is out of the aforementioned range listed, please consider follow up with your Primary Care Provider.  If you are age 82 or younger, your body mass index should be between 19-25. Your Body mass index is 24.25 kg/m. If this is out of the aformentioned range listed, please consider follow up with your Primary Care Provider.    Thank you for choosing [facility]  Philbert RiserKavitha Nandigam,MD

## 2018-08-03 NOTE — Progress Notes (Signed)
         Ross Morgan    [id number]    04/26/1928  Primary Care Physician:Shaw, William, MD  Referring Physician: Shaw, William, MD [address] [place], [place] [address]  Chief complaint: Rectal bleeding and discomfort  HPI:  84-year-old male with history of symptomatic bradycardia status post placement of permanent pacemaker in July 2015, history of A. fib, TIA on Pradaxa, CHF with mild aortic stenosis, hypertension and hyperlipidemia here with complaints of rectal pain and rectal bleeding He had a large amount of bright red blood oozing from his rectum after bowel movement this morning and continues to stain his undergarments with rectal bleeding  History of chronic anal fissure ~5 years, was evaluated at [facility] recommended Botox and surgery but patient opted not to do either. He uses hydrocortisone with lidocaine almost daily for many years now.   Colonoscopy June 10, 2011 by Dr. Ganem showed sigmoid diverticulosis otherwise normal exam.  No recall due to advanced age. Father had rectal cancer and mother has history of colon polyposis syndrome.  He has had multiple colonoscopies and last colonoscopy was in 2012.   Outpatient Encounter Medications as of 08/03/2018  Medication Sig  . alfuzosin (UROXATRAL) 10 MG 24 hr tablet Take 10 mg by mouth every evening.  . amLODipine (NORVASC) 5 MG tablet Take 5 mg by mouth daily.  . atorvastatin (LIPITOR) 20 MG tablet Take 20 mg by mouth at bedtime.  . Calcium Carbonate-Simethicone (ALKA-SELTZER HEARTBURN + GAS) 750-80 MG CHEW Chew 1-2 each by mouth at bedtime.  . citalopram (CELEXA) 10 MG tablet Take 20 mg by mouth at bedtime.   . Coenzyme Q10 (CO Q-10) 200 MG CAPS Take 1 capsule by mouth daily.   . dabigatran (PRADAXA) 150 MG CAPS capsule Take 150 mg by mouth 2 (two) times daily.  . diclofenac sodium (VOLTAREN) 1 % GEL Apply 2 g topically as needed.  . fluorouracil (EFUDEX) 5 % cream Apply 1 application topically daily  as needed (for irritation).  . Lidocaine-Hydrocortisone Ace 3-0.5 % KIT APPLY TO RECTUM TWICE DAILY PRN  . Multiple Vitamin (MULTIVITAMIN) capsule Take 1 capsule by mouth daily.  . NON FORMULARY Place 1 drop into the left eye at bedtime. CVS lubricant eye drops left eye only  . olmesartan (BENICAR) 40 MG tablet Take 40 mg by mouth daily.  . pimecrolimus (ELIDEL) 1 % cream Apply 1 application topically daily as needed (for face).   . sitaGLIPtin-metformin (JANUMET) 50-1000 MG per tablet Take 1 tablet by mouth 2 (two) times daily with a meal.  . zinc oxide (BALMEX) 11.3 % CREA cream Apply 1 application topically daily as needed (for rash).  . [DISCONTINUED] diltiazem 2 % GEL Apply 1 application topically 2 (two) times daily.   No facility-administered encounter medications on file as of 08/03/2018.     Allergies as of 08/03/2018 - Review Complete 08/03/2018  Allergen Reaction Noted  . Hctz [hydrochlorothiazide] Other (See Comments) 11/22/2015  . Zetia [ezetimibe] Other (See Comments) 11/22/2015    Past Medical History:  Diagnosis Date  . Anal fissure   . Arthritis    "left pointer" (05/30/2014)  . Atrial fibrillation ([facility])   . Bradycardia   . Depression   . Hyperlipidemia   . Hypertension   . Knee pain 2015   "tore tendon left knee; no OR"  . On continuous oral anticoagulation   . Stroke ([facility])   . Symptomatic bradycardia    status post dual AV permanent transvenous   pacemaker insertion  . Type II diabetes mellitus ([place])     Past Surgical History:  Procedure Laterality Date  . CATARACT EXTRACTION W/ INTRAOCULAR LENS  IMPLANT, BILATERAL Bilateral 2000's  . PACEMAKER INSERTION  05/31/14   STJ dual chamber pacemaker implanted by Dr Lovena Le for symptomatic bradycardia  . PERMANENT PACEMAKER INSERTION N/A 05/31/2014   Procedure: PERMANENT PACEMAKER INSERTION;  Surgeon: Evans Lance, MD;  Location: [facility] CATH LAB;  Service: Cardiovascular;  Laterality: N/A;    Family History  Problem  Relation Age of Onset  . Heart attack Neg Hx     Social History   Socioeconomic History  . Marital status: Married    Spouse name: Not on file  . Number of children: 2  . Years of education: Not on file  . Highest education level: Not on file  Occupational History  . Occupation: retired Optometrist  Social Needs  . Financial resource strain: Not on file  . Food insecurity:    Worry: Not on file    Inability: Not on file  . Transportation needs:    Medical: Not on file    Non-medical: Not on file  Tobacco Use  . Smoking status: Former Smoker    Years: 1.00    Types: Cigarettes  . Smokeless tobacco: Never Used  . Tobacco comment: "quit smoking in ~ 1952  Substance and Sexual Activity  . Alcohol use: Yes    Comment: 05/30/2014 "glass of wine ~ once/month"  . Drug use: No  . Sexual activity: Yes  Lifestyle  . Physical activity:    Days per week: Not on file    Minutes per session: Not on file  . Stress: Not on file  Relationships  . Social connections:    Talks on phone: Not on file    Gets together: Not on file    Attends religious service: Not on file    Active member of club or organization: Not on file    Attends meetings of clubs or organizations: Not on file    Relationship status: Not on file  . Intimate partner violence:    Fear of current or ex partner: Not on file    Emotionally abused: Not on file    Physically abused: Not on file    Forced sexual activity: Not on file  Other Topics Concern  . Not on file  Social History Narrative  . Not on file      Review of systems: Review of Systems  Constitutional: Negative for fever and chills.  HENT: Positive for sinus trouble Eyes: Negative for blurred vision.  Respiratory: Negative for cough, shortness of breath and wheezing.   Cardiovascular: Negative for chest pain and palpitations.  Gastrointestinal: as per HPI Genitourinary: Negative for dysuria, urgency, frequency and hematuria.    Musculoskeletal: Negative for myalgias, back pain and positive for joint pain with arthritis.  Skin: Positive for itching and rash, history of lichen planus.  Neurological: Negative for dizziness, tremors, focal weakness, seizures and loss of consciousness.  Endo/Heme/Allergies: Positive for seasonal allergies.  Psychiatric/Behavioral: Negative for depression, suicidal ideas and hallucinations.  All other systems reviewed and are negative.   Physical Exam: Vitals:   08/03/18 1428  BP: (!) 110/58  Pulse: 78   Body mass index is 24.25 kg/m. Gen:      No acute distress HEENT:  EOMI, sclera anicteric Neck:     No masses; no thyromegaly Lungs:    Clear to auscultation bilaterally; normal respiratory effort CV:  Regular rate and rhythm; no murmurs Abd:      + bowel sounds; soft, non-tender; no palpable masses, no distension Ext:    No edema; adequate peripheral perfusion Skin:      Warm and dry; no rash Neuro: alert and oriented x 3 Psych: normal mood and affect Rectal exam: Peri-anal erythema and friable skin concerning for anal lichen planus, increased anal sphincter tone with tenderness.  No palpable nodule or abscess collection.  Anoscopy: Not performed Data Reviewed:  Reviewed labs, radiology imaging, old records and pertinent past GI work up   Assessment and Plan/Recommendations:  84 year old male with history of symptomatic bradycardia status post placement of permanent pacemaker in July 2015, history of A. fib, TIA on Pradaxa, CHF with mild aortic stenosis, hypertension and hyperlipidemia previously followed by [facility] is here with complaints of anorectal discomfort and rectal bleeding Anoscopy could not be performed but rectal exam concerning for anal fissure He has history of anal fissure in the past. Check hemoglobin and hematocrit Perianal erythema and friable skin, ?  Anal lichen planus Advised patient to avoid applying hydrocortisone cream per rectum or  perianal area continuously Start Benefiber 1 teaspoon 3 times daily with meals. Small pea-sized amount of rectal nitroglycerin 0.125% per rectum 3-4 times daily for 6 to 8 weeks Small amount of RectiCare per rectum up to 3 times daily as needed for symptomatic relief Return in 3 to 4 weeks or sooner if needed.   Damaris Hippo , MD [phone]    CC: Marton Redwood, MD

## 2018-08-10 ENCOUNTER — Encounter: Payer: Self-pay | Admitting: Gastroenterology

## 2018-08-12 LAB — CUP PACEART REMOTE DEVICE CHECK
Battery Remaining Longevity: 123 mo
Battery Remaining Percentage: 95.5 %
Battery Voltage: 3.01 V
Implantable Lead Implant Date: 20150721
Implantable Lead Location: 753860
Lead Channel Impedance Value: 430 Ohm
Lead Channel Pacing Threshold Pulse Width: 0.4 ms
Lead Channel Sensing Intrinsic Amplitude: 9.2 mV
Lead Channel Setting Pacing Amplitude: 2.5 V
Lead Channel Setting Pacing Pulse Width: 0.4 ms
Lead Channel Setting Sensing Sensitivity: 6 mV
MDC IDC LEAD IMPLANT DT: 20150721
MDC IDC LEAD LOCATION: 753859
MDC IDC MSMT LEADCHNL RV PACING THRESHOLD AMPLITUDE: 1.25 V
MDC IDC PG IMPLANT DT: 20150721
MDC IDC PG SERIAL: 7643490
MDC IDC SESS DTM: 20190912144324
MDC IDC STAT BRADY RV PERCENT PACED: 93 %

## 2018-08-26 ENCOUNTER — Ambulatory Visit (INDEPENDENT_AMBULATORY_CARE_PROVIDER_SITE_OTHER): Payer: Medicare Other | Admitting: Nurse Practitioner

## 2018-08-26 ENCOUNTER — Encounter: Payer: Self-pay | Admitting: Nurse Practitioner

## 2018-08-26 VITALS — BP 150/70 | HR 64 | Ht 70.0 in | Wt 171.0 lb

## 2018-08-26 DIAGNOSIS — K602 Anal fissure, unspecified: Secondary | ICD-10-CM | POA: Diagnosis not present

## 2018-08-26 DIAGNOSIS — K648 Other hemorrhoids: Secondary | ICD-10-CM | POA: Diagnosis not present

## 2018-08-26 NOTE — Progress Notes (Signed)
Chief Complaint:    Persistent anorectal pain  IMPRESSION and PLAN:    84 year old male with posterior midline anal fissure.  This was presumed to be the case when seen in September.  He has been using nitroglycerin ointment 3 times daily, recticare, as well as fiber.  Bleeding stopped but pain with bowel movements has not improved.  -After inserting lidocaine I was able to perform an anoscopy today.  Posterior midline fissure seen as well as inflamed internal hemorrhoid -I do not think the patient has been applying the nitroglycerin ointment far enough up into the anal canal.  I demonstrated for him, he needs to put the ointment on his finger and insert up to the first knuckle.  -He will call us in a few weeks if not continuing to improve.  Hold off on treating the internal hemorrhoids right now      HPI:     Patient is 84 year old male seen late September for evaluation of and a rectal discomfort and rectal bleeding.  An anoscopy could not be performed but rectal exam was concerning for an anal fissure, he has had them in the past.  Advised to avoid hydrocortisone cream on a continuous basis.  Patient was started on Benefiber, nitroglycerin ointment 3-4 times a day and RectiCare as needed.   Patient continues to have significant pain with bowel movements the bleeding has stopped.  After bowel movement he wipes well but hours later sees a little bit of fecal drainage and underwear.  He has been taking a fiber and using the nitroglycerin ointment as well as RectiCare.  Bowels are moving well, he is not constipated/straining   Review of systems:     No chest pain, no SOB, no fevers, no urinary sx   Past Medical History:  Diagnosis Date  . Anal fissure   . Arthritis    "left pointer" (05/30/2014)  . Atrial fibrillation ([place])   . Bradycardia   . Depression   . Hyperlipidemia   . Hypertension   . Knee pain 2015   "tore tendon left knee; no OR"  . On continuous oral  anticoagulation   . Stroke ([place])   . Symptomatic bradycardia    status post dual AV permanent transvenous pacemaker insertion  . Type II diabetes mellitus (HCC)     Patient's surgical history, family medical history, social history, medications and allergies were all reviewed in Epic   Creatinine clearance cannot be calculated (Patient's most recent lab result is older than the maximum 21 days allowed.)  Current Outpatient Medications  Medication Sig Dispense Refill  . alfuzosin (UROXATRAL) 10 MG 24 hr tablet Take 10 mg by mouth every evening.    . AMBULATORY NON FORMULARY MEDICATION Medication Name: 0.125% nitroglycerin ointment to use pea sized amount per rectum along with recticare 30 g 1  . amLODipine (NORVASC) 5 MG tablet Take 5 mg by mouth daily.    [name] atorvastatin (LIPITOR) 20 MG tablet Take 20 mg by mouth at bedtime.  6  . Calcium Carbonate-Simethicone (ALKA-SELTZER HEARTBURN + GAS) 750-80 MG CHEW Chew 1-2 each by mouth at bedtime.    . citalopram (CELEXA) 10 MG tablet Take 20 mg by mouth at bedtime.     . Coenzyme Q10 (CO Q-10) 200 MG CAPS Take 1 capsule by mouth daily.     . dabigatran (PRADAXA) 150 MG CAPS capsule Take 150 mg by mouth 2 (two) times daily.    . diclofenac sodium (VOLTAREN) 1 %  GEL Apply 2 g topically as needed.    . fluorouracil (EFUDEX) 5 % cream Apply 1 application topically daily as needed (for irritation).    . Lidocaine-Hydrocortisone Ace 3-0.5 % KIT APPLY TO RECTUM TWICE DAILY PRN    . Multiple Vitamin (MULTIVITAMIN) capsule Take 1 capsule by mouth daily.    . NON FORMULARY Place 1 drop into the left eye at bedtime. [facility] lubricant eye drops left eye only    . olmesartan (BENICAR) 40 MG tablet Take 40 mg by mouth daily.    . pimecrolimus (ELIDEL) 1 % cream Apply 1 application topically daily as needed (for face).     . sitaGLIPtin-metformin (JANUMET) 50-1000 MG per tablet Take 1 tablet by mouth 2 (two) times daily with a meal.    . zinc oxide (BALMEX) 11.3 %  CREA cream Apply 1 application topically daily as needed (for rash).     No current facility-administered medications for this visit.     Physical Exam:     BP (!) 150/70   Pulse 64   Ht _0  (1.778 m)   Wt 171 lb (77.6 kg)   BMI 24.54 kg/m   GENERAL:  Pleasant male in NAD PSYCH: : Cooperative, normal affect Rectal: perianal erythema without raised areas / plaque. Anoscopy: large inflamed hemorrhoid, posteriro midline fissure inside anal canal NEURO: Alert and oriented x 3, no focal neurologic deficits   Tye Savoy , NP 08/26/2018, 1:40 PM

## 2018-08-26 NOTE — Patient Instructions (Signed)
If you are age 82 or older, your body mass index should be between 23-30. Your Body mass index is 24.54 kg/m. If this is out of the aforementioned range listed, please consider follow up with your Primary Care Provider.  If you are age 87 or younger, your body mass index should be between 19-25. Your Body mass index is 24.54 kg/m. If this is out of the aformentioned range listed, please consider follow up with your Primary Care Provider.   Continue same medications.    Call if no improvement in a few weeks.  Thank you for choosing me and [facility].   Willette Cluster, NP

## 2018-08-27 NOTE — Progress Notes (Signed)
Reviewed and agree with documentation and assessment and plan. K. Veena Yamato Kopf , MD   

## 2018-09-15 ENCOUNTER — Other Ambulatory Visit: Payer: Self-pay | Admitting: Internal Medicine

## 2018-09-15 DIAGNOSIS — R0989 Other specified symptoms and signs involving the circulatory and respiratory systems: Secondary | ICD-10-CM

## 2018-09-17 ENCOUNTER — Ambulatory Visit
Admission: RE | Admit: 2018-09-17 | Discharge: 2018-09-17 | Disposition: A | Discharge status: Home / Self Care | Payer: Medicare Other | Source: Ambulatory Visit | Attending: Internal Medicine | Admitting: Internal Medicine

## 2018-09-17 DIAGNOSIS — R0989 Other specified symptoms and signs involving the circulatory and respiratory systems: Secondary | ICD-10-CM

## 2018-09-22 ENCOUNTER — Telehealth: Payer: Self-pay | Admitting: Cardiovascular Disease

## 2018-09-22 NOTE — Telephone Encounter (Signed)
   Primary Cardiologist: Nanetta BattyJonathan Berry, MD  Chart reviewed as part of pre-operative protocol coverage. Patient was contacted 09/22/2018 in reference to pre-operative risk assessment for pending surgery as outlined below.  Arlean HoppingLeonard Takahashi was last seen on 06/16/18 by Dr. Allyson SabalBerry.  Since that day, Arlean HoppingLeonard Fassler has done well.  Therefore, based on ACC/AHA guidelines, the patient would be at acceptable risk for the planned procedure without further cardiovascular testing.   I will route this recommendation to the requesting party via Epic fax function and remove from pre-op pool.  Please call with questions.  [place] MorrisBhavinkumar Tashara Suder, [place] 09/22/2018, 4:07 PM

## 2018-09-22 NOTE — Telephone Encounter (Signed)
° °  [facility] Pre-operative Risk Assessment    Request for surgical clearance:  1. What type of surgery is being performed? T-Tubes placed  2. When is this surgery scheduled? TBD  3. What type of clearance is required (medical clearance vs. Pharmacy clearance to hold med vs. Both)? Medical Clearance  4. Are there any medications that need to be held prior to surgery and how long?  none  5. Practice name and name of physician performing surgery? [facility], Dr. Melissa Montane   6. What is your office phone number? [phone]   7.   What is your office fax number?              [phone]  8.   Anesthesia type (None, local, MAC, general) ? General    Ross Morgan 09/22/2018, 3:39 PM  _________________________________________________________________   (provider comments below)

## 2018-10-22 ENCOUNTER — Telehealth: Payer: Self-pay | Admitting: Cardiology

## 2018-10-22 NOTE — Telephone Encounter (Signed)
LMOVM reminding pt to send remote transmission.   

## 2018-10-23 ENCOUNTER — Telehealth: Payer: Self-pay | Admitting: Internal Medicine

## 2018-10-23 ENCOUNTER — Encounter: Payer: Self-pay | Admitting: Cardiology

## 2018-10-23 ENCOUNTER — Ambulatory Visit (INDEPENDENT_AMBULATORY_CARE_PROVIDER_SITE_OTHER): Payer: Medicare Other

## 2018-10-23 DIAGNOSIS — Z950 Presence of cardiac pacemaker: Secondary | ICD-10-CM

## 2018-10-23 DIAGNOSIS — R001 Bradycardia, unspecified: Secondary | ICD-10-CM | POA: Diagnosis not present

## 2018-10-23 NOTE — Telephone Encounter (Signed)
Per pt call - stated he called the company and they stated his device is working properly.

## 2018-10-23 NOTE — Telephone Encounter (Signed)
LVM for return call to clarify his message.

## 2018-10-26 NOTE — Progress Notes (Signed)
Remote pacemaker transmission.   

## 2018-10-27 ENCOUNTER — Encounter: Payer: Self-pay | Admitting: Cardiology

## 2018-10-27 NOTE — Telephone Encounter (Signed)
Informed pt that we did received his remote transmission on 10-23-2018.

## 2018-12-05 LAB — CUP PACEART REMOTE DEVICE CHECK
Date Time Interrogation Session: 20200125105731
Implantable Lead Implant Date: 20150721
Implantable Lead Implant Date: 20150721
Implantable Lead Location: 753859
Implantable Pulse Generator Implant Date: 20150721
MDC IDC LEAD LOCATION: 753860
MDC IDC PG SERIAL: 7643490

## 2019-01-25 ENCOUNTER — Other Ambulatory Visit: Payer: Self-pay

## 2019-01-25 ENCOUNTER — Ambulatory Visit (INDEPENDENT_AMBULATORY_CARE_PROVIDER_SITE_OTHER): Payer: Medicare Other | Admitting: *Deleted

## 2019-01-25 DIAGNOSIS — I495 Sick sinus syndrome: Secondary | ICD-10-CM

## 2019-01-25 DIAGNOSIS — R001 Bradycardia, unspecified: Secondary | ICD-10-CM | POA: Diagnosis not present

## 2019-01-26 LAB — CUP PACEART REMOTE DEVICE CHECK
Battery Remaining Percentage: 95.5 %
Date Time Interrogation Session: 20200317113752
Implantable Lead Implant Date: 20150721
Implantable Lead Implant Date: 20150721
Implantable Lead Location: 753859
Implantable Lead Location: 753860
Implantable Pulse Generator Implant Date: 20150721
Lead Channel Sensing Intrinsic Amplitude: 9.2 mV
Lead Channel Setting Pacing Pulse Width: 0.4 ms
Lead Channel Setting Sensing Sensitivity: 6 mV
MDC IDC MSMT BATTERY REMAINING LONGEVITY: 124 mo
MDC IDC MSMT BATTERY VOLTAGE: 2.99 V
MDC IDC MSMT LEADCHNL RV IMPEDANCE VALUE: 450 Ohm
MDC IDC SET LEADCHNL RV PACING AMPLITUDE: 2.5 V
MDC IDC STAT BRADY RV PERCENT PACED: 94 %
Pulse Gen Model: 2240
Pulse Gen Serial Number: 7643490

## 2019-02-02 NOTE — Progress Notes (Signed)
Remote pacemaker transmission.   

## 2019-03-30 DIAGNOSIS — H6692 Otitis media, unspecified, left ear: Secondary | ICD-10-CM | POA: Insufficient documentation

## 2019-04-26 ENCOUNTER — Telehealth: Payer: Self-pay | Admitting: *Deleted

## 2019-04-26 ENCOUNTER — Ambulatory Visit (INDEPENDENT_AMBULATORY_CARE_PROVIDER_SITE_OTHER): Payer: Medicare Other | Admitting: *Deleted

## 2019-04-26 DIAGNOSIS — I495 Sick sinus syndrome: Secondary | ICD-10-CM

## 2019-04-26 LAB — CUP PACEART REMOTE DEVICE CHECK
Battery Remaining Longevity: 124 mo
Battery Remaining Percentage: 95.5 %
Battery Voltage: 2.99 V
Brady Statistic RV Percent Paced: 95 %
Date Time Interrogation Session: 20200615101828
Implantable Lead Implant Date: 20150721
Implantable Lead Implant Date: 20150721
Implantable Lead Location: 753859
Implantable Lead Location: 753860
Implantable Pulse Generator Implant Date: 20150721
Lead Channel Impedance Value: 450 Ohm
Lead Channel Pacing Threshold Amplitude: 1.25 V
Lead Channel Pacing Threshold Pulse Width: 0.4 ms
Lead Channel Sensing Intrinsic Amplitude: 7.6 mV
Lead Channel Setting Pacing Amplitude: 2.5 V
Lead Channel Setting Pacing Pulse Width: 0.4 ms
Lead Channel Setting Sensing Sensitivity: 6 mV
Pulse Gen Model: 2240
Pulse Gen Serial Number: 7643490

## 2019-04-26 NOTE — Telephone Encounter (Signed)
A message was left, re: follow up visit. 

## 2019-05-04 ENCOUNTER — Encounter: Payer: Self-pay | Admitting: Cardiology

## 2019-05-04 NOTE — Progress Notes (Signed)
Remote pacemaker transmission.   

## 2019-06-07 ENCOUNTER — Telehealth: Payer: Self-pay

## 2019-06-07 NOTE — Telephone Encounter (Signed)
Spoke with pt regarding covid-19 screening prior to appt. Pt stated he has not been in contact with anyone who may have covid-19 and has no symptoms. 

## 2019-06-08 ENCOUNTER — Other Ambulatory Visit: Payer: Self-pay

## 2019-06-08 ENCOUNTER — Ambulatory Visit (INDEPENDENT_AMBULATORY_CARE_PROVIDER_SITE_OTHER): Payer: Medicare Other | Admitting: Internal Medicine

## 2019-06-08 ENCOUNTER — Encounter: Payer: Self-pay | Admitting: Internal Medicine

## 2019-06-08 VITALS — BP 120/64 | HR 65 | Ht 70.0 in | Wt 163.4 lb

## 2019-06-08 DIAGNOSIS — R001 Bradycardia, unspecified: Secondary | ICD-10-CM | POA: Diagnosis not present

## 2019-06-08 DIAGNOSIS — Z950 Presence of cardiac pacemaker: Secondary | ICD-10-CM

## 2019-06-08 DIAGNOSIS — I10 Essential (primary) hypertension: Secondary | ICD-10-CM

## 2019-06-08 DIAGNOSIS — I480 Paroxysmal atrial fibrillation: Secondary | ICD-10-CM

## 2019-06-08 NOTE — Progress Notes (Signed)
HPI Ross Morgan returns today for follow-up. He is a very pleasant 85 year old man with a history of symptomatic sinus node dysfunction and complete heart block, who underwent permanent pacemaker insertionalmost 5yearsago. He denies chest pain or shortness of breath. No peripheral edema. He has had problems with vertigo. He has not been in the hospital. He has some hearing difficulty. Allergies  Allergen Reactions   Hctz [Hydrochlorothiazide] Other (See Comments)    Severe reaction   Zetia [Ezetimibe] Other (See Comments)    LONG TIME AGO     Current Outpatient Medications  Medication Sig Dispense Refill   alfuzosin (UROXATRAL) 10 MG 24 hr tablet Take 10 mg by mouth every evening.     AMBULATORY NON FORMULARY MEDICATION Medication Name: 0.125% nitroglycerin ointment to use pea sized amount per rectum along with recticare 30 g 1   amLODipine (NORVASC) 5 MG tablet Take 5 mg by mouth daily.     atorvastatin (LIPITOR) 20 MG tablet Take 20 mg by mouth at bedtime.  6   Calcium Carbonate-Simethicone (ALKA-SELTZER HEARTBURN + GAS) 750-80 MG CHEW Chew 1-2 each by mouth at bedtime.     citalopram (CELEXA) 20 MG tablet Take 1 tablet by mouth at bedtime.     Coenzyme Q10 (CO Q-10) 200 MG CAPS Take 1 capsule by mouth daily.      dabigatran (PRADAXA) 150 MG CAPS capsule Take 150 mg by mouth 2 (two) times daily.     diclofenac sodium (VOLTAREN) 1 % GEL Apply 2 g topically as needed.     fluorouracil (EFUDEX) 5 % cream Apply 1 application topically daily as needed (for irritation).     Lidocaine-Hydrocortisone Ace 3-0.5 % KIT APPLY TO RECTUM TWICE DAILY PRN     Multiple Vitamin (MULTIVITAMIN) capsule Take 1 capsule by mouth daily.     NON FORMULARY Place 1 drop into the left eye at bedtime. [facility] lubricant eye drops left eye only     olmesartan (BENICAR) 40 MG tablet Take 40 mg by mouth daily.     pimecrolimus (ELIDEL) 1 % cream Apply 1 application topically daily as needed (for  face).      sitaGLIPtin-metformin (JANUMET) 50-1000 MG per tablet Take 1 tablet by mouth 2 (two) times daily with a meal.     zinc oxide (BALMEX) 11.3 % CREA cream Apply 1 application topically daily as needed (for rash).     No current facility-administered medications for this visit.      Past Medical History:  Diagnosis Date   Anal fissure    Arthritis    "left pointer" (05/30/2014)   Atrial fibrillation (HCC)    Bradycardia    Depression    Hyperlipidemia    Hypertension    Knee pain 2015   "tore tendon left knee; no OR"   On continuous oral anticoagulation    Stroke (HCC)    Symptomatic bradycardia    status post dual AV permanent transvenous pacemaker insertion   Type II diabetes mellitus ([place])     ROS:   All systems reviewed and negative except as noted in the HPI.   Past Surgical History:  Procedure Laterality Date   CATARACT EXTRACTION W/ INTRAOCULAR LENS  IMPLANT, BILATERAL Bilateral 2000's   PACEMAKER INSERTION  05/31/14   STJ dual chamber pacemaker implanted by Dr Lovena Le for symptomatic bradycardia   PERMANENT PACEMAKER INSERTION N/A 05/31/2014   Procedure: PERMANENT PACEMAKER INSERTION;  Surgeon: Evans Lance, MD;  Location: [facility] CATH LAB;  Service: Cardiovascular;  Laterality: N/A;     Family History  Problem Relation Age of Onset   Heart attack Neg Hx      Social History   Socioeconomic History   Marital status: Married    Spouse name: Not on file   Number of children: 2   Years of education: Not on file   Highest education level: Not on file  Occupational History   Occupation: retired Programmer, applications strain: Not on file   Food insecurity    Worry: Not on file    Inability: Not on file   Transportation needs    Medical: Not on file    Non-medical: Not on file  Tobacco Use   Smoking status: Former Smoker    Years: 1.00    Types: Cigarettes   Smokeless tobacco: Never Used    Tobacco comment: "quit smoking in ~ 1952  Substance and Sexual Activity   Alcohol use: Yes    Comment: 05/30/2014 "glass of wine ~ once/month"   Drug use: No   Sexual activity: Yes  Lifestyle   Physical activity    Days per week: Not on file    Minutes per session: Not on file   Stress: Not on file  Relationships   Social connections    Talks on phone: Not on file    Gets together: Not on file    Attends religious service: Not on file    Active member of club or organization: Not on file    Attends meetings of clubs or organizations: Not on file    Relationship status: Not on file   Intimate partner violence    Fear of current or ex partner: Not on file    Emotionally abused: Not on file    Physically abused: Not on file    Forced sexual activity: Not on file  Other Topics Concern   Not on file  Social History Narrative   Not on file     BP 120/64    Pulse 65    Ht '5\' 10"'$  (1.778 m)    Wt 163 lb 6.4 oz (74.1 kg)    SpO2 97%    BMI 23.45 kg/m   Physical Exam:  Well appearing 85 yo man, NAD HEENT: Unremarkable Neck:  6 cm JVD, no thyromegally Lymphatics:  No adenopathy Back:  No CVA tenderness Lungs:  Clear with no wheezes HEART:  Regular rate rhythm, no murmurs, no rubs, no clicks Abd:  soft, positive bowel sounds, no organomegally, no rebound, no guarding Ext:  2 plus pulses, no edema, no cyanosis, no clubbing Skin:  No rashes no nodules Neuro:  CN II through XII intact, motor grossly intact  EKG - atrial fib with intermittent ventricular pacing  DEVICE  Normal device function.  See PaceArt for details.   Assess/Plan: 1. CHB - he is asymptomatic, s/p PPM insertion. 2. PPM - his St. Jude DDD PPM is programmed VVIR. We will recheck in several months. 3. HTN - his blood pressure is well controlled. No change. 4. Vertigo - no evidence that his symptoms are related to a cardiac arrhythmia. He will undergo watchful waiting.   Ross Morgan.D.

## 2019-06-08 NOTE — Patient Instructions (Signed)
Medication Instructions:  Your physician recommends that you continue on your current medications as directed. Please refer to the Current Medication list given to you today.  Labwork: None ordered.  Testing/Procedures: None ordered.  Follow-Up: Your physician wants you to follow-up in: one year with [name].   You will receive a reminder letter in the mail two months in advance. If you don't receive a letter, please call our office to schedule the follow-up appointment.  Remote monitoring is used to monitor your Pacemaker from home. This monitoring reduces the number of office visits required to check your device to one time per year. It allows [place] to keep an eye on the functioning of your device to ensure it is working properly. You are scheduled for a device check from home on 07/26/2019. You may send your transmission at any time that day. If you have a wireless device, the transmission will be sent automatically. After your physician reviews your transmission, you will receive a postcard with your next transmission date.  Any Other Special Instructions Will Be Listed Below (If Applicable).  If you need a refill on your cardiac medications before your next appointment, please call your pharmacy.

## 2019-06-10 ENCOUNTER — Other Ambulatory Visit: Payer: Self-pay

## 2019-06-23 ENCOUNTER — Ambulatory Visit (INDEPENDENT_AMBULATORY_CARE_PROVIDER_SITE_OTHER): Payer: Medicare Other | Admitting: Cardiovascular Disease

## 2019-06-23 ENCOUNTER — Other Ambulatory Visit: Payer: Self-pay

## 2019-06-23 ENCOUNTER — Encounter: Payer: Self-pay | Admitting: Cardiovascular Disease

## 2019-06-23 DIAGNOSIS — Z950 Presence of cardiac pacemaker: Secondary | ICD-10-CM | POA: Diagnosis not present

## 2019-06-23 DIAGNOSIS — I4811 Longstanding persistent atrial fibrillation: Secondary | ICD-10-CM | POA: Diagnosis not present

## 2019-06-23 DIAGNOSIS — E782 Mixed hyperlipidemia: Secondary | ICD-10-CM

## 2019-06-23 DIAGNOSIS — I10 Essential (primary) hypertension: Secondary | ICD-10-CM | POA: Diagnosis not present

## 2019-06-23 NOTE — Assessment & Plan Note (Signed)
History of permanent transvenous pacemaker implantation followed by [name]

## 2019-06-23 NOTE — Assessment & Plan Note (Signed)
History of essential hypertension with blood pressure measured today at 135/71.  He is on amlodipine and olmesartan.

## 2019-06-23 NOTE — Assessment & Plan Note (Signed)
History of hyperlipidemia on statin therapy with lipid profile performed 03/06/2018 revealing total cholesterol of 114, LDL 72 and HDL 33.

## 2019-06-23 NOTE — Progress Notes (Signed)
06/23/2019 Ross Morgan   1928-09-05  [id number]  Primary Physician Marton Redwood, MD Primary Cardiologist: Lorretta Harp MD Lupe Carney, [place]  HPI:  Ross Morgan is a 85 y.o.  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 There is no other cardiovascular risk factor history. He has had bradycardia in the past. He says his blood pressure at home. He's had recent heart rates in the 30s and 40s with excessive fatigue and dizziness which is orthostatic. He denies chest pain but does have dyspnea on exertion. He had an event monitor that showed significant bradycardia and ultimately was admitted and underwent permanent pacemaker insertion by [name] on 05/31/14. He had a St. Jude DDD permanent pacemaker (serial [id number]) inserted. He had a 2-D echocardiogram performed on 05/06/14 which was essentially normal. His CHA2DSVASC score was 9 and he was properly anticoagulated with a novel oral anticoagulant. He feels symptomatically improved since pacemaker insertion. [name] follows his pacemaker as an outpatient.  Hehad a TIA earlier last year andwas seen by Dr. Leonie Man. CT scan was negative of his head. His Eliquis changed to Pradaxa . He isunderwentspeech therapyand had complete resolution of dysarthria but still has some left lower extremity weakness. He does complain of some mild dyspnea on exertion which has not changed in severity. His last 2-D echocardiogram performed 11/20/15 revealed EF of 45-50% with mild aortic stenosis. Unfortunately, his wife of 62years died on April 30, 2016 she did have Parkinson's disease. [facility]    Since I saw him 12 months ago he is done well.  He denies chest pain or shortness of breath.  He has  come down with vertigo over the last 2 weeks which is somewhat debilitating.  Current Meds  Medication Sig  . alfuzosin (UROXATRAL) 10 MG 24 hr tablet Take 10 mg by mouth every evening.  . AMBULATORY NON FORMULARY MEDICATION Medication Name: 0.125% nitroglycerin ointment to use pea sized amount per rectum along with recticare  . amLODipine (NORVASC) 5 MG tablet Take 5 mg by mouth daily.  [name] atorvastatin (LIPITOR) 20 MG tablet Take 20 mg by mouth at bedtime.  . Calcium Carbonate-Simethicone (ALKA-SELTZER HEARTBURN + GAS) 750-80 MG CHEW Chew 1-2 each by mouth at bedtime.  . citalopram (CELEXA) 20 MG tablet Take 1 tablet by mouth at bedtime.  . Coenzyme Q10 (CO Q-10) 200 MG CAPS Take 1 capsule by mouth daily.   . dabigatran (PRADAXA) 150 MG CAPS capsule Take 150 mg by mouth 2 (two) times daily.  . diclofenac sodium (VOLTAREN) 1 % GEL Apply 2 g topically as needed.  . fluorouracil (EFUDEX) 5 % cream Apply 1 application topically daily as needed (for irritation).  . Lidocaine-Hydrocortisone Ace 3-0.5 % KIT APPLY TO RECTUM TWICE DAILY PRN  . Multiple Vitamin (MULTIVITAMIN) capsule Take 1 capsule by mouth daily.  . NON FORMULARY Place 1 drop into the left eye at bedtime. [facility] lubricant eye drops left eye only  . olmesartan (BENICAR) 40 MG tablet Take 40 mg by mouth daily.  . pimecrolimus (ELIDEL) 1 % cream Apply 1 application topically daily as needed (for face).   . sitaGLIPtin-metformin (JANUMET) 50-1000 MG per tablet Take 1 tablet by mouth 2 (two) times daily with a meal.  . zinc oxide (BALMEX) 11.3 %  CREA cream Apply 1 application topically daily as needed (for rash).     Allergies  Allergen Reactions  . Hctz [Hydrochlorothiazide] Other (See Comments)    Severe reaction  . Zetia [Ezetimibe] Other (See Comments)    LONG TIME AGO    Social History   Socioeconomic History  . Marital status: Married    Spouse name: Not on file  . Number of children: 2  . Years of education: Not on file   . Highest education level: Not on file  Occupational History  . Occupation: retired Optometrist  Social Needs  . Financial resource strain: Not on file  . Food insecurity    Worry: Not on file    Inability: Not on file  . Transportation needs    Medical: Not on file    Non-medical: Not on file  Tobacco Use  . Smoking status: Former Smoker    Years: 1.00    Types: Cigarettes  . Smokeless tobacco: Never Used  . Tobacco comment: "quit smoking in ~ 1952  Substance and Sexual Activity  . Alcohol use: Yes    Comment: 05/30/2014 "glass of wine ~ once/month"  . Drug use: No  . Sexual activity: Yes  Lifestyle  . Physical activity    Days per week: Not on file    Minutes per session: Not on file  . Stress: Not on file  Relationships  . Social Herbalist on phone: Not on file    Gets together: Not on file    Attends religious service: Not on file    Active member of club or organization: Not on file    Attends meetings of clubs or organizations: Not on file    Relationship status: Not on file  . Intimate partner violence    Fear of current or ex partner: Not on file    Emotionally abused: Not on file    Physically abused: Not on file    Forced sexual activity: Not on file  Other Topics Concern  . Not on file  Social History Narrative  . Not on file     Review of Systems: General: negative for chills, fever, night sweats or weight changes.  Cardiovascular: negative for chest pain, dyspnea on exertion, edema, orthopnea, palpitations, paroxysmal nocturnal dyspnea or shortness of breath Dermatological: negative for rash Respiratory: negative for cough or wheezing Urologic: negative for hematuria Abdominal: negative for nausea, vomiting, diarrhea, bright red blood per rectum, melena, or hematemesis Neurologic: negative for visual changes, syncope, or dizziness All other systems reviewed and are otherwise negative except as noted above.    Blood pressure 135/71,  pulse 64, height 5' 10" (1.778 m), weight 163 lb 6.4 oz (74.1 kg).  General appearance: alert and no distress Neck: no adenopathy, no carotid bruit, no JVD, supple, symmetrical, trachea midline and thyroid not enlarged, symmetric, no tenderness/mass/nodules Lungs: clear to auscultation bilaterally Heart: irregularly irregular rhythm Extremities: extremities normal, atraumatic, no cyanosis or edema Pulses: 2+ and symmetric Skin: Skin color, texture, turgor normal. No rashes or lesions Neurologic: Alert and oriented X 3, normal strength and tone. Normal symmetric reflexes. Normal coordination and gait  EKG not performed today  ASSESSMENT AND PLAN:   Essential hypertension History of essential hypertension with blood pressure measured today at 135/71.  He is on amlodipine and olmesartan.  Hyperlipidemia History of hyperlipidemia on statin therapy with lipid profile performed 03/06/2018 revealing total cholesterol of 114, LDL 72 and HDL 33.  Atrial fibrillation, slow VR  and pauses History of chronic A. fib with slow ventricular spots on Pradaxa.  Pacemaker History of permanent transvenous pacemaker implantation followed by Dr. Hetty Blend MD [facility] 06/23/2019 3:55 PM

## 2019-06-23 NOTE — Patient Instructions (Signed)
Medication Instructions:  Continue current medications  If you need a refill on your cardiac medications before your next appointment, please call your pharmacy.  Labwork: None Ordered   Testing/Procedures: None Ordered  Follow-Up: You will need a follow up appointment in 1 Year.  Please call our office 2 months in advance to schedule this appointment.  You may see Jonathan Berry, MD or one of the following Advanced Practice Providers on your designated Care Team:   Luke Kilroy, PA-C Krista Kroeger, PA-C . Callie Goodrich, PA-C     At [facility], you and your health needs are our priority.  As part of our continuing mission to provide you with exceptional heart care, we have created designated Provider Care Teams.  These Care Teams include your primary Cardiologist (physician) and Advanced Practice Providers (APPs -  Physician Assistants and Nurse Practitioners) who all work together to provide you with the care you need, when you need it.  Thank you for choosing [facility] at [facility]!!     

## 2019-06-23 NOTE — Assessment & Plan Note (Signed)
History of chronic A. fib with slow ventricular spots on Pradaxa.

## 2019-06-25 LAB — CUP PACEART INCLINIC DEVICE CHECK
Date Time Interrogation Session: 20200814160703
Implantable Lead Implant Date: 20150721
Implantable Lead Implant Date: 20150721
Implantable Lead Location: 753859
Implantable Lead Location: 753860
Implantable Pulse Generator Implant Date: 20150721
Pulse Gen Model: 2240
Pulse Gen Serial Number: 7643490

## 2019-07-26 ENCOUNTER — Ambulatory Visit (INDEPENDENT_AMBULATORY_CARE_PROVIDER_SITE_OTHER): Payer: Medicare Other | Admitting: *Deleted

## 2019-07-26 DIAGNOSIS — I639 Cerebral infarction, unspecified: Secondary | ICD-10-CM

## 2019-07-26 DIAGNOSIS — I480 Paroxysmal atrial fibrillation: Secondary | ICD-10-CM

## 2019-07-26 LAB — CUP PACEART REMOTE DEVICE CHECK
Battery Remaining Longevity: 123 mo
Battery Remaining Percentage: 95.5 %
Battery Voltage: 2.99 V
Brady Statistic RV Percent Paced: 96 %
Date Time Interrogation Session: 20200914060005
Implantable Lead Implant Date: 20150721
Implantable Lead Implant Date: 20150721
Implantable Lead Location: 753859
Implantable Lead Location: 753860
Implantable Pulse Generator Implant Date: 20150721
Lead Channel Impedance Value: 430 Ohm
Lead Channel Pacing Threshold Amplitude: 1 V
Lead Channel Pacing Threshold Pulse Width: 0.4 ms
Lead Channel Sensing Intrinsic Amplitude: 9.4 mV
Lead Channel Setting Pacing Amplitude: 2.5 V
Lead Channel Setting Pacing Pulse Width: 0.4 ms
Lead Channel Setting Sensing Sensitivity: 6 mV
Pulse Gen Model: 2240
Pulse Gen Serial Number: 7643490

## 2019-08-09 NOTE — Progress Notes (Signed)
Remote pacemaker transmission.   

## 2019-12-09 ENCOUNTER — Telehealth: Payer: Self-pay

## 2019-12-09 ENCOUNTER — Ambulatory Visit (INDEPENDENT_AMBULATORY_CARE_PROVIDER_SITE_OTHER): Payer: Medicare PPO | Admitting: *Deleted

## 2019-12-09 DIAGNOSIS — I639 Cerebral infarction, unspecified: Secondary | ICD-10-CM

## 2019-12-09 NOTE — Telephone Encounter (Signed)
The pt called to get help sending a manual transmission with his home remote monitor. Transmission received and I added him to the schedule.

## 2019-12-10 LAB — CUP PACEART REMOTE DEVICE CHECK
Battery Remaining Longevity: 121 mo
Battery Remaining Percentage: 95.5 %
Battery Voltage: 2.99 V
Brady Statistic RV Percent Paced: 97 %
Date Time Interrogation Session: 20210128142309
Implantable Lead Implant Date: 20150721
Implantable Lead Implant Date: 20150721
Implantable Lead Location: 753859
Implantable Lead Location: 753860
Implantable Pulse Generator Implant Date: 20150721
Lead Channel Impedance Value: 400 Ohm
Lead Channel Pacing Threshold Amplitude: 1 V
Lead Channel Pacing Threshold Pulse Width: 0.4 ms
Lead Channel Sensing Intrinsic Amplitude: 6.3 mV
Lead Channel Setting Pacing Amplitude: 2.5 V
Lead Channel Setting Pacing Pulse Width: 0.4 ms
Lead Channel Setting Sensing Sensitivity: 6 mV
Pulse Gen Model: 2240
Pulse Gen Serial Number: 7643490

## 2019-12-23 ENCOUNTER — Other Ambulatory Visit (HOSPITAL_COMMUNITY): Payer: Self-pay | Admitting: Internal Medicine

## 2019-12-23 ENCOUNTER — Other Ambulatory Visit: Payer: Self-pay

## 2019-12-23 ENCOUNTER — Ambulatory Visit (HOSPITAL_COMMUNITY)
Admission: RE | Admit: 2019-12-23 | Discharge: 2019-12-23 | Disposition: A | Discharge status: Home / Self Care | Payer: Medicare PPO | Source: Ambulatory Visit | Attending: Surgery | Admitting: Surgery

## 2019-12-23 DIAGNOSIS — R609 Edema, unspecified: Secondary | ICD-10-CM

## 2020-01-14 ENCOUNTER — Ambulatory Visit (INDEPENDENT_AMBULATORY_CARE_PROVIDER_SITE_OTHER): Payer: Medicare PPO | Admitting: Cardiovascular Disease

## 2020-01-14 ENCOUNTER — Encounter: Payer: Self-pay | Admitting: Cardiovascular Disease

## 2020-01-14 ENCOUNTER — Other Ambulatory Visit: Payer: Self-pay

## 2020-01-14 VITALS — BP 120/68 | HR 70 | Ht 70.0 in | Wt 167.6 lb

## 2020-01-14 DIAGNOSIS — E782 Mixed hyperlipidemia: Secondary | ICD-10-CM

## 2020-01-14 DIAGNOSIS — R600 Localized edema: Secondary | ICD-10-CM

## 2020-01-14 DIAGNOSIS — I4811 Longstanding persistent atrial fibrillation: Secondary | ICD-10-CM | POA: Diagnosis not present

## 2020-01-14 DIAGNOSIS — I10 Essential (primary) hypertension: Secondary | ICD-10-CM

## 2020-01-14 NOTE — Patient Instructions (Signed)
Medication Instructions:  Your physician recommends that you continue on your current medications as directed. Please refer to the Current Medication list given to you today.  If you need a refill on your cardiac medications before your next appointment, please call your pharmacy.   Lab work: NONE  Testing/Procedures: Your physician has requested that you have an echocardiogram. Echocardiography is a painless test that uses sound waves to create images of your heart. It provides your doctor with information about the size and shape of your heart and how well your heart's chambers and valves are working. This procedure takes approximately one hour. There are no restrictions for this procedure. [address]  Follow-Up: At [facility], you and your health needs are our priority.  As part of our continuing mission to provide you with exceptional heart care, we have created designated Provider Care Teams.  These Care Teams include your primary Cardiologist (physician) and Advanced Practice Providers (APPs -  Physician Assistants and Nurse Practitioners) who all work together to provide you with the care you need, when you need it. You may see Nanetta Batty, MD or one of the following Advanced Practice Providers on your designated Care Team:    Corine Shelter, PA-C  [place], [place]  Edd Fabian, [place]  Your physician wants you to follow-up in: 3 months with Dr. Allyson Sabal

## 2020-01-14 NOTE — Assessment & Plan Note (Signed)
History of essential hypertension a blood pressure measured today at 135/71.  He is on carvedilol and Benicar.  He was recently on amlodipine which was discontinued because of lower extremity edema.

## 2020-01-14 NOTE — Progress Notes (Signed)
01/14/2020 [name]   [birth year]  [id number]  Primary Physician [name], MD Primary Cardiologist: Runell Gess MD Nicholes Calamity, [place]  HPI:  Ross Morgan is a 86 y.o.  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 Eric Form for evaluation of symptomatic bradycardia.I last saw him in the office  06/23/2019.[name] He has a history of hypertension medically treated as well as hyperlipidemia. There is no other cardiovascular risk factor history. He has had bradycardia in the past. He says his blood pressure at home. He's had recent heart rates in the 30s and 40s with excessive fatigue and dizziness which is orthostatic. He denies chest pain but does have dyspnea on exertion. He had an event monitor that showed significant bradycardia and ultimately was admitted and underwent permanent pacemaker insertion by Dr. Ladona Ridgel on 05/31/14. He had a St. Jude DDD permanent pacemaker (serial [id number]) inserted. He had a 2-D echocardiogram performed on 05/06/14 which was essentially normal. His CHA2DSVASC score was 9 and he was properly anticoagulated with a novel oral anticoagulant. He feels symptomatically improved since pacemaker insertion. Dr. Ladona Ridgel follows his pacemaker as an outpatient.   Hehad a TIA earlier last year andwas seen by Dr. Pearlean Brownie. CT scan was negative of his head. His Eliquis changed to Pradaxa . He isunderwentspeech therapyand had complete resolution of dysarthria but still has some left lower extremity weakness. He does complain of some mild dyspnea on exertion which has not changed in severity. His last 2-D echocardiogram performed 11/20/15 revealed EF of 45-50% with mild aortic stenosis.  Unfortunately, his wife of 62years died on April 23, 2016 she did have Parkinson's disease. [facility]  Since I saw him 8 months ago he has developed worsening lower extremity edema.  He also admits to  increasing depression.  Dr. Clelia Croft has increased his furosemide from 40 mg daily to twice daily.  Apparently BNP was elevated as well.  Venous Dopplers ruled out DVT.  He is mildly short of breath.  His last 2D echo performed 11/19/2015 revealed an EF of 45 to 50%.   No outpatient medications have been marked as taking for the 01/14/20 encounter (Office Visit) with Runell Gess, MD.     Allergies  Allergen Reactions  . Hctz [Hydrochlorothiazide] Other (See Comments)    Severe reaction  . Zetia [Ezetimibe] Other (See Comments)    LONG TIME AGO    Social History   Socioeconomic History  . Marital status: Married    Spouse name: Not on file  . Number of children: 2  . Years of education: Not on file  . Highest education level: Not on file  Occupational History  . Occupation: retired Biomedical scientist  Tobacco Use  . Smoking status: Former Smoker    Years: 1.00    Types: Cigarettes  . Smokeless tobacco: Never Used  . Tobacco comment: "quit smoking in ~ 1952  Substance and Sexual Activity  . Alcohol use: Yes    Comment: 05/30/2014 "glass of wine ~ once/month"  . Drug use: No  . Sexual activity: Yes  Other Topics Concern  . Not on file  Social History Narrative  . Not on file   Social Determinants of Health   Financial Resource Strain:   . Difficulty of Paying Living Expenses: Not on file  Food Insecurity:   . Worried About Programme researcher, broadcasting/film/video in the Last Year: Not on file  . Ran Out of  Food in the Last Year: Not on file  Transportation Needs:   . Lack of Transportation (Medical): Not on file  . Lack of Transportation (Non-Medical): Not on file  Physical Activity:   . Days of Exercise per Week: Not on file  . Minutes of Exercise per Session: Not on file  Stress:   . Feeling of Stress : Not on file  Social Connections:   . Frequency of Communication with Friends and Family: Not on file  . Frequency of Social Gatherings with Friends and Family: Not on file  . Attends  Religious Services: Not on file  . Active Member of Clubs or Organizations: Not on file  . Attends Archivist Meetings: Not on file  . Marital Status: Not on file  Intimate Partner Violence:   . Fear of Current or Ex-Partner: Not on file  . Emotionally Abused: Not on file  . Physically Abused: Not on file  . Sexually Abused: Not on file     Review of Systems: General: negative for chills, fever, night sweats or weight changes.  Cardiovascular: negative for chest pain, dyspnea on exertion, edema, orthopnea, palpitations, paroxysmal nocturnal dyspnea or shortness of breath Dermatological: negative for rash Respiratory: negative for cough or wheezing Urologic: negative for hematuria Abdominal: negative for nausea, vomiting, diarrhea, bright red blood per rectum, melena, or hematemesis Neurologic: negative for visual changes, syncope, or dizziness All other systems reviewed and are otherwise negative except as noted above.    Blood pressure 120/68, pulse 70, height 5\' 10"  (1.778 m), weight 167 lb 9.6 oz (76 kg), SpO2 99 %.  General appearance: alert and no distress Neck: no adenopathy, no carotid bruit, no JVD, supple, symmetrical, trachea midline and thyroid not enlarged, symmetric, no tenderness/mass/nodules Lungs: clear to auscultation bilaterally Heart: regular rate and rhythm, S1, S2 normal, no murmur, click, rub or gallop Extremities: 2-3+ pitting edema bilaterally Pulses: 2+ and symmetric Skin: Skin color, texture, turgor normal. No rashes or lesions Neurologic: Alert and oriented X 3, normal strength and tone. Normal symmetric reflexes. Normal coordination and gait  EKG ventricular paced rhythm at 70.  I personally reviewed this EKG  ASSESSMENT AND PLAN:   Essential hypertension History of essential hypertension a blood pressure measured today at 135/71.  He is on carvedilol and Benicar.  He was recently on amlodipine which was discontinued because of lower  extremity edema.  Hyperlipidemia History of hyperlipidemia on statin therapy with lipid profile performed 07/22/2019 revealing total cholesterol of 106, LDL 59 and HDL of 36.  Atrial fibrillation, slow VR and pauses History of PAF with slow ventricular response on Pradaxa status post permanent transvenous pacemaker insertion followed by [name].  Bilateral lower extremity edema Worsening bilateral lower extreme edema.  His amlodipine was discontinued.  He was on furosemide 40 mg a day which was just doubled to 80 mg a day.  His last echo performed 11/19/2015 revealed EF of 45 to 50%.  He has 2-3+ pitting edema on exam today.  I am going to repeat a 2D echocardiogram to further evaluate.  Apparently a recent BNP was elevated as well.      Lorretta Harp MD FACP,FACC,FAHA, FSCAI 01/14/2020 12:00 PM

## 2020-01-14 NOTE — Assessment & Plan Note (Signed)
History of hyperlipidemia on statin therapy with lipid profile performed 07/22/2019 revealing total cholesterol of 106, LDL 59 and HDL of 36.

## 2020-01-14 NOTE — Assessment & Plan Note (Signed)
Worsening bilateral lower extreme edema.  His amlodipine was discontinued.  He was on furosemide 40 mg a day which was just doubled to 80 mg a day.  His last echo performed 11/19/2015 revealed EF of 45 to 50%.  He has 2-3+ pitting edema on exam today.  I am going to repeat a 2D echocardiogram to further evaluate.  Apparently a recent BNP was elevated as well.

## 2020-01-14 NOTE — Assessment & Plan Note (Signed)
History of PAF with slow ventricular response on Pradaxa status post permanent transvenous pacemaker insertion followed by Dr. Ladona Ridgel.

## 2020-01-19 ENCOUNTER — Telehealth: Payer: Self-pay | Admitting: Cardiovascular Disease

## 2020-01-19 NOTE — Addendum Note (Signed)
Addended by: Orlene Och on: 01/19/2020 11:26 AM   Modules accepted: Orders

## 2020-01-19 NOTE — Telephone Encounter (Signed)
shortness of breath Pt c/o Shortness Of Breath: STAT if SOB developed within the last 24 hours or pt is noticeably SOB on the phone  1. Are you currently SOB (can you hear that pt is SOB on the phone)?   2. How long have you been experiencing SOB? 2 weeks  3. Are you SOB when sitting or when up moving around? Both  4. Are you currently experiencing any other symptoms? No  Ross Morgan, with [facility] states she is concerned due to recent symptoms of SOB, following Lasix medication increase. She states he denies experiencing any other symptoms, however she is concerned he may be developing congestive heart failure.  Please return call and advise.

## 2020-01-19 NOTE — Telephone Encounter (Signed)
Spoke with Dois Davenport and pt still has 3+ edema no change since increasing Furosemide to 40 mg bid  Pt also notes SOB comes and goes  For 2 weeks Per Dois Davenport pt was panicking yesterday evening and O2 Sats were 88- 91 % Sandra applied O2 and Sats improved to 96- 98 % Dois Davenport would like a order for prn O2  If possible Will forward to DR Allyson Sabal for review and recommendations ./cy

## 2020-01-20 NOTE — Telephone Encounter (Signed)
Patient needs to come in to see an APP early next week for evaluation of heart failure

## 2020-01-24 NOTE — Telephone Encounter (Signed)
Spoke with patient to schedule appointment with an APP this week per Dr. Hazle Coca request.  Patient states he has an appointment for an Echo on the 19th and will keep that appointment.  He does not drive and cannot keep making so many appointments.  He will call us if he needs to come in prior to his June appointment with Dr. Allyson Sabal.

## 2020-01-24 NOTE — Telephone Encounter (Signed)
Spoke to patient's RN Cherly Hensen at [facility] [employer].Advised scheduler called patient to schedule appointment with PA this week and patient refused appointment.She stated patient has swelling in both lower legs.He has not complained of sob this weekend.He has a echo scheduled 3/19.Appointment scheduled with Dr.Berry 3/23 at 10:00 am.Advised to call sooner if needed.

## 2020-01-27 ENCOUNTER — Other Ambulatory Visit (HOSPITAL_COMMUNITY): Payer: Medicare PPO

## 2020-01-28 ENCOUNTER — Ambulatory Visit (HOSPITAL_COMMUNITY): Payer: Medicare PPO | Attending: Internal Medicine

## 2020-01-28 ENCOUNTER — Other Ambulatory Visit: Payer: Self-pay

## 2020-01-28 DIAGNOSIS — R600 Localized edema: Secondary | ICD-10-CM | POA: Diagnosis not present

## 2020-02-01 ENCOUNTER — Ambulatory Visit (INDEPENDENT_AMBULATORY_CARE_PROVIDER_SITE_OTHER): Payer: Medicare PPO | Admitting: Cardiovascular Disease

## 2020-02-01 ENCOUNTER — Encounter: Payer: Self-pay | Admitting: Cardiovascular Disease

## 2020-02-01 ENCOUNTER — Other Ambulatory Visit: Payer: Self-pay

## 2020-02-01 DIAGNOSIS — R600 Localized edema: Secondary | ICD-10-CM

## 2020-02-01 LAB — BASIC METABOLIC PANEL
BUN/Creatinine Ratio: 18 (ref 10–24)
BUN: 24 mg/dL (ref 8–27)
CO2: 27 mmol/L (ref 20–29)
Calcium: 9.2 mg/dL (ref 8.6–10.2)
Chloride: 98 mmol/L (ref 96–106)
Creatinine, Ser: 1.3 mg/dL — ABNORMAL HIGH (ref 0.76–1.27)
GFR calc Af Amer: 56 mL/min/{1.73_m2} — ABNORMAL LOW (ref >59)
GFR calc non Af Amer: 48 mL/min/{1.73_m2} — ABNORMAL LOW (ref >59)
Glucose: 95 mg/dL (ref 65–99)
Potassium: 4.1 mmol/L (ref 3.5–5.2)
Sodium: 140 mmol/L (ref 134–144)

## 2020-02-01 MED ORDER — METOLAZONE 2.5 MG PO TABS: 2.5000 mg | ORAL_TABLET | ORAL | 3 refills | Status: DC

## 2020-02-01 NOTE — Patient Instructions (Signed)
Medication Instructions:  TAKE METOLAZONE 2.5 MG EVERY OTHER DAY 30 MINUTES PRIOR TO TAKING FUROSEMIDE   *If you need a refill on your cardiac medications before your next appointment, please call your pharmacy*   Lab Work: Your physician recommends that you HAVE LAB WORK TODAY  Your physician recommends that you return for lab work in: 7-10 DAYS  If you have labs (blood work) drawn today and your tests are completely normal, you will receive your results only by: [name] MyChart Message (if you have MyChart) OR . A paper copy in the mail If you have any lab test that is abnormal or we need to change your treatment, we will call you to review the results.  Follow-Up: At [facility], you and your health needs are our priority.  As part of our continuing mission to provide you with exceptional heart care, we have created designated Provider Care Teams.  These Care Teams include your primary Cardiologist (physician) and Advanced Practice Providers (APPs -  Physician Assistants and Nurse Practitioners) who all work together to provide you with the care you need, when you need it.  We recommend signing up for the patient portal called "MyChart".  Sign up information is provided on this After Visit Summary.  MyChart is used to connect with patients for Virtual Visits (Telemedicine).  Patients are able to view lab/test results, encounter notes, upcoming appointments, etc.  Non-urgent messages can be sent to your provider as well.   To learn more about what you can do with MyChart, go to [email or web address].    Your next appointment:   Your physician recommends that you schedule a follow-up appointment in: 1 MONTH WITH APP  Your physician recommends that you schedule a follow-up appointment in: 3 MONTHS WITH DR Allyson Sabal

## 2020-02-01 NOTE — Assessment & Plan Note (Signed)
History of bilateral lower extremity edema with gradual weight gain despite not eating any salt.  Dr. Clelia Croft did double his furosemide from 40 mg once a day to twice daily without much benefit.  He does complain of some orthopnea.  He really does not walk much outside the house so does not really complain of dyspnea.  He has 3+ pitting edema on exam.  I am going to add Zaroxolyn 2.5 mg every other morning half hour before his a.m. Lasix dose.  We will check a basic metabolic panel today and again in 10 days and I will have him see an APP back in 1 month and me in 3 months.  I did obtain a 2D echocardiogram 01/28/2020 that revealed a slight decrease in LV function from 45 to 50% down to 40% with mild to moderate MR.

## 2020-02-01 NOTE — Progress Notes (Signed)
02/01/2020 Ross Morgan   [birth year]  [id number]  Primary Physician Marton Redwood, MD Primary Cardiologist: Lorretta Harp MD Lupe Carney, [place]  HPI:  Ross Morgan is a 86 y.o.  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 There is no other cardiovascular risk factor history. He has had bradycardia in the past. He says his blood pressure at home. He's had recent heart rates in the 30s and 40s with excessive fatigue and dizziness which is orthostatic. He denies chest pain but does have dyspnea on exertion. He had an event monitor that showed significant bradycardia and ultimately was admitted and underwent permanent pacemaker insertion by [name] on 05/31/14. He had a St. Jude DDD permanent pacemaker (serial [id number]) inserted. He had a 2-D echocardiogram performed on 05/06/14 which was essentially normal. His CHA2DSVASC score was 9 and he was properly anticoagulated with a novel oral anticoagulant. He feels symptomatically improved since pacemaker insertion. [name] follows his pacemaker as an outpatient.   Hehad a TIA earlier last year andwas seen by Dr. Leonie Man. CT scan was negative of his head. His Eliquis changed to Pradaxa . He isunderwentspeech therapyand had complete resolution of dysarthria but still has some left lower extremity weakness. He does complain of some mild dyspnea on exertion which has not changed in severity. His last 2-D echocardiogram performed 11/20/15 revealed EF of 45-50% with mild aortic stenosis.  Unfortunately, his wife of 62years died on 04-24-16 she did have Parkinson's disease. [facility]   He has developed worsening lower extremity edema.  He also admits to increasing depression.   Dr. Brigitte Pulse has increased his furosemide from 40 mg daily to twice daily.  Apparently BNP was elevated as well.  Venous Dopplers ruled out DVT.  He is mildly short of breath.  His last 2D echo performed 11/19/2015 revealed an EF of 45 to 50%.  I obtained a 2D echo on 01/28/2020 that showed a slight decline in LV function from 45 to 50% down to 40% with mild to moderate MR.  He does complain of some orthopnea and has 3+ pitting edema despite doubling his furosemide by Dr. Brigitte Pulse.  He is aware of salt restriction.  Current Meds  Medication Sig  . alfuzosin (UROXATRAL) 10 MG 24 hr tablet Take 10 mg by mouth every evening.  . AMBULATORY NON FORMULARY MEDICATION Medication Name: 0.125% nitroglycerin ointment to use pea sized amount per rectum along with recticare  . atorvastatin (LIPITOR) 20 MG tablet Take 20 mg by mouth at bedtime.  . Calcium Carbonate-Simethicone (ALKA-SELTZER HEARTBURN + GAS) 750-80 MG CHEW Chew 1-2 each by mouth at bedtime.  . carvedilol (COREG) 3.125 MG tablet Take 3.125 mg by mouth in the morning and at bedtime.  . citalopram (CELEXA) 20 MG tablet Take 1 tablet by mouth at bedtime.  . Coenzyme Q10 (CO Q-10) 200 MG CAPS Take 1 capsule by mouth daily.   . dabigatran (PRADAXA) 150 MG CAPS capsule Take 150 mg by mouth 2 (two) times daily.  . diclofenac sodium (VOLTAREN) 1 % GEL Apply 2 g topically as needed.  . fluorouracil (EFUDEX) 5 % cream Apply 1 application topically daily as needed (for irritation).  . furosemide (LASIX) 40 MG tablet 40 mg in the morning and at bedtime.  [name]  Lidocaine-Hydrocortisone Ace 3-0.5 % KIT APPLY TO RECTUM TWICE DAILY PRN  . Multiple Vitamin (MULTIVITAMIN) capsule Take 1 capsule by mouth daily.  . NON FORMULARY Place 1 drop into the left eye at bedtime. [employer] lubricant eye drops left eye only  . olmesartan (BENICAR) 40 MG tablet Take 40 mg by mouth daily.  . pimecrolimus (ELIDEL) 1 % cream Apply 1 application topically daily as needed (for face).   .  sitaGLIPtin-metformin (JANUMET) 50-1000 MG per tablet Take 1 tablet by mouth 2 (two) times daily with a meal.  . zinc oxide (BALMEX) 11.3 % CREA cream Apply 1 application topically daily as needed (for rash).     Allergies  Allergen Reactions  . Hctz [Hydrochlorothiazide] Other (See Comments)    Severe reaction  . Zetia [Ezetimibe] Other (See Comments)    LONG TIME AGO    Social History   Socioeconomic History  . Marital status: Married    Spouse name: Not on file  . Number of children: 2  . Years of education: Not on file  . Highest education level: Not on file  Occupational History  . Occupation: retired Optometrist  Tobacco Use  . Smoking status: Former Smoker    Years: 1.00    Types: Cigarettes  . Smokeless tobacco: Never Used  . Tobacco comment: "quit smoking in ~ 1952  Substance and Sexual Activity  . Alcohol use: Yes    Comment: 05/30/2014 "glass of wine ~ once/month"  . Drug use: No  . Sexual activity: Yes  Other Topics Concern  . Not on file  Social History Narrative  . Not on file   Social Determinants of Health   Financial Resource Strain:   . Difficulty of Paying Living Expenses:   Food Insecurity:   . Worried About Charity fundraiser in the Last Year:   . Arboriculturist in the Last Year:   Transportation Needs:   . Film/video editor (Medical):   [name] Lack of Transportation (Non-Medical):   Physical Activity:   . Days of Exercise per Week:   . Minutes of Exercise per Session:   Stress:   . Feeling of Stress :   Social Connections:   . Frequency of Communication with Friends and Family:   . Frequency of Social Gatherings with Friends and Family:   . Attends Religious Services:   . Active Member of Clubs or Organizations:   . Attends Archivist Meetings:   [name] Marital Status:   Intimate Partner Violence:   . Fear of Current or Ex-Partner:   . Emotionally Abused:   [name] Physically Abused:   . Sexually Abused:      Review of  Systems: General: negative for chills, fever, night sweats or weight changes.  Cardiovascular: negative for chest pain, dyspnea on exertion, edema, orthopnea, palpitations, paroxysmal nocturnal dyspnea or shortness of breath Dermatological: negative for rash Respiratory: negative for cough or wheezing Urologic: negative for hematuria Abdominal: negative for nausea, vomiting, diarrhea, bright red blood per rectum, melena, or hematemesis Neurologic: negative for visual changes, syncope, or dizziness All other systems reviewed and are otherwise negative except as noted above.    Blood pressure 138/70, pulse 69, height '5\' 10"'$  (1.778 m), weight 172 lb 6.4 oz (78.2 kg), SpO2 96 %.  General appearance: alert and no distress Neck: no adenopathy, no JVD, supple, symmetrical, trachea midline, thyroid not enlarged, symmetric, no tenderness/mass/nodules and Soft high-pitched left carotid bruit Lungs: clear to auscultation bilaterally Heart: regular rate  and rhythm, S1, S2 normal, no murmur, click, rub or gallop Extremities: 3+ pitting edema bilaterally Pulses: 2+ and symmetric Skin: Skin color, texture, turgor normal. No rashes or lesions Neurologic: Alert and oriented X 3, normal strength and tone. Normal symmetric reflexes. Normal coordination and gait  EKG not performed today  ASSESSMENT AND PLAN:   Bilateral lower extremity edema History of bilateral lower extremity edema with gradual weight gain despite not eating any salt.  Dr. Brigitte Pulse did double his furosemide from 40 mg once a day to twice daily without much benefit.  He does complain of some orthopnea.  He really does not walk much outside the house so does not really complain of dyspnea.  He has 3+ pitting edema on exam.  I am going to add Zaroxolyn 2.5 mg every other morning half hour before his a.m. Lasix dose.  We will check a basic metabolic panel today and again in 10 days and I will have him see an APP back in 1 month and me in 3 months.   I did obtain a 2D echocardiogram 01/28/2020 that revealed a slight decrease in LV function from 45 to 50% down to 40% with mild to moderate MR.      Lorretta Harp MD FACP,FACC,FAHA, [facility] 02/01/2020 10:41 AM

## 2020-02-07 ENCOUNTER — Telehealth: Payer: Self-pay | Admitting: Adult Health

## 2020-02-07 NOTE — Telephone Encounter (Signed)
Received a new hem referral from Dr. Clelia Croft at [facility] for thrombocytopenia and macrocytic anemia. Pt has been cld and scheduled to see Mardella Layman on 4/13 at 2pm w/labs at 1:30pm. Pt aware to arrive 15 minutes early.

## 2020-02-10 DIAGNOSIS — R600 Localized edema: Secondary | ICD-10-CM | POA: Diagnosis not present

## 2020-02-11 LAB — BASIC METABOLIC PANEL
BUN/Creatinine Ratio: 20 (ref 10–24)
BUN: 32 mg/dL — ABNORMAL HIGH (ref 8–27)
CO2: 33 mmol/L — ABNORMAL HIGH (ref 20–29)
Calcium: 9.7 mg/dL (ref 8.6–10.2)
Chloride: 94 mmol/L — ABNORMAL LOW (ref 96–106)
Creatinine, Ser: 1.6 mg/dL — ABNORMAL HIGH (ref 0.76–1.27)
GFR calc Af Amer: 44 mL/min/{1.73_m2} — ABNORMAL LOW (ref >59)
GFR calc non Af Amer: 38 mL/min/{1.73_m2} — ABNORMAL LOW (ref >59)
Glucose: 143 mg/dL — ABNORMAL HIGH (ref 65–99)
Potassium: 4.1 mmol/L (ref 3.5–5.2)
Sodium: 140 mmol/L (ref 134–144)

## 2020-02-15 ENCOUNTER — Other Ambulatory Visit: Payer: Self-pay

## 2020-02-21 DIAGNOSIS — H903 Sensorineural hearing loss, bilateral: Secondary | ICD-10-CM | POA: Diagnosis not present

## 2020-02-22 ENCOUNTER — Inpatient Hospital Stay: Payer: Medicare PPO | Attending: Adult Health | Admitting: Adult Health

## 2020-02-22 ENCOUNTER — Other Ambulatory Visit: Payer: Self-pay

## 2020-02-22 ENCOUNTER — Inpatient Hospital Stay: Payer: Medicare PPO

## 2020-02-22 ENCOUNTER — Encounter: Payer: Self-pay | Admitting: Adult Health

## 2020-02-22 VITALS — BP 134/64 | HR 61 | Temp 99.1°F | Resp 18 | Ht 70.0 in | Wt 164.5 lb

## 2020-02-22 DIAGNOSIS — Z950 Presence of cardiac pacemaker: Secondary | ICD-10-CM | POA: Insufficient documentation

## 2020-02-22 DIAGNOSIS — D649 Anemia, unspecified: Secondary | ICD-10-CM | POA: Diagnosis not present

## 2020-02-22 DIAGNOSIS — I480 Paroxysmal atrial fibrillation: Secondary | ICD-10-CM | POA: Insufficient documentation

## 2020-02-22 DIAGNOSIS — D693 Immune thrombocytopenic purpura: Secondary | ICD-10-CM

## 2020-02-22 DIAGNOSIS — D696 Thrombocytopenia, unspecified: Secondary | ICD-10-CM

## 2020-02-22 DIAGNOSIS — E119 Type 2 diabetes mellitus without complications: Secondary | ICD-10-CM | POA: Diagnosis not present

## 2020-02-22 DIAGNOSIS — D539 Nutritional anemia, unspecified: Secondary | ICD-10-CM

## 2020-02-22 LAB — CMP (CANCER CENTER ONLY)
ALT: 19 U/L (ref 0–44)
AST: 25 U/L (ref 15–41)
Albumin: 3.9 g/dL (ref 3.5–5.0)
Alkaline Phosphatase: 121 U/L (ref 38–126)
Anion gap: 9 (ref 5–15)
BUN: 27 mg/dL — ABNORMAL HIGH (ref 8–23)
CO2: 31 mmol/L (ref 22–32)
Calcium: 9.3 mg/dL (ref 8.9–10.3)
Chloride: 101 mmol/L (ref 98–111)
Creatinine: 1.41 mg/dL — ABNORMAL HIGH (ref 0.61–1.24)
GFR, Est AFR Am: 51 mL/min — ABNORMAL LOW (ref >60)
GFR, Estimated: 44 mL/min — ABNORMAL LOW (ref >60)
Glucose, Bld: 105 mg/dL — ABNORMAL HIGH (ref 70–99)
Potassium: 4.1 mmol/L (ref 3.5–5.1)
Sodium: 141 mmol/L (ref 135–145)
Total Bilirubin: 0.9 mg/dL (ref 0.3–1.2)
Total Protein: 7.6 g/dL (ref 6.5–8.1)

## 2020-02-22 LAB — VITAMIN B12: Vitamin B-12: 355 pg/mL (ref 180–914)

## 2020-02-22 LAB — CBC WITH DIFFERENTIAL (CANCER CENTER ONLY)
Abs Immature Granulocytes: 0.01 10*3/uL (ref 0.00–0.07)
Basophils Absolute: 0 10*3/uL (ref 0.0–0.1)
Basophils Relative: 0 %
Eosinophils Absolute: 0.2 10*3/uL (ref 0.0–0.5)
Eosinophils Relative: 3 %
HCT: 33.1 % — ABNORMAL LOW (ref 39.0–52.0)
Hemoglobin: 10.5 g/dL — ABNORMAL LOW (ref 13.0–17.0)
Immature Granulocytes: 0 %
Lymphocytes Relative: 22 %
Lymphs Abs: 1.1 10*3/uL (ref 0.7–4.0)
MCH: 32.8 pg (ref 26.0–34.0)
MCHC: 31.7 g/dL (ref 30.0–36.0)
MCV: 103.4 fL — ABNORMAL HIGH (ref 80.0–100.0)
Monocytes Absolute: 0.5 10*3/uL (ref 0.1–1.0)
Monocytes Relative: 10 %
Neutro Abs: 3.4 10*3/uL (ref 1.7–7.7)
Neutrophils Relative %: 65 %
Platelet Count: 77 10*3/uL — ABNORMAL LOW (ref 150–400)
RBC: 3.2 MIL/uL — ABNORMAL LOW (ref 4.22–5.81)
RDW: 14.3 % (ref 11.5–15.5)
WBC Count: 5.2 10*3/uL (ref 4.0–10.5)
nRBC: 0 % (ref 0.0–0.2)

## 2020-02-22 LAB — SEDIMENTATION RATE: Sed Rate: 66 mm/hr — ABNORMAL HIGH (ref 0–16)

## 2020-02-22 LAB — IRON AND TIBC
Iron: 53 ug/dL (ref 42–163)
Saturation Ratios: 13 % — ABNORMAL LOW (ref 20–55)
TIBC: 396 ug/dL (ref 202–409)
UIBC: 343 ug/dL (ref 117–376)

## 2020-02-22 LAB — RETICULOCYTES
Immature Retic Fract: 13.5 % (ref 2.3–15.9)
RBC.: 3.21 MIL/uL — ABNORMAL LOW (ref 4.22–5.81)
Retic Count, Absolute: 31.8 10*3/uL (ref 19.0–186.0)
Retic Ct Pct: 1 % (ref 0.4–3.1)

## 2020-02-22 LAB — PLATELET BY CITRATE

## 2020-02-22 LAB — C-REACTIVE PROTEIN: CRP: 0.7 mg/dL (ref <1.0)

## 2020-02-22 LAB — FOLATE: Folate: 52.9 ng/mL (ref >5.9)

## 2020-02-22 LAB — LACTATE DEHYDROGENASE: LDH: 198 U/L — ABNORMAL HIGH (ref 98–192)

## 2020-02-22 LAB — FERRITIN: Ferritin: 103 ng/mL (ref 24–336)

## 2020-02-22 LAB — SAVE SMEAR(SSMR), FOR PROVIDER SLIDE REVIEW

## 2020-02-22 NOTE — Progress Notes (Signed)
[place]  [phone] [phone]     ID: Dev Dhondt DOB: 17-Nov-1927  MR#: [id number]  BMW#:[id number]  Patient Care Team: Marton Redwood, MD as PCP - General (Internal Medicine) Lorretta Harp, MD as PCP - Cardiology (Cardiology) Magrinat, Virgie Dad, MD as Consulting Physician (Oncology) Scot Dock, NP OTHER MD:  CHIEF COMPLAINT: anemia, thrombocytopenia  CURRENT TREATMENT: trial of therapy   HISTORY OF CURRENT ILLNESS: Parag Dorton is a pleasant 86 year old man who was seen by his PCP, Dr. Brigitte Pulse for left ear bleeding.  This had happened spontaneously.  Ronald has Atrial Fibrillation and a h/o CVA and is taking Pradaxa.  He also has a platelet count of 89 and 82 when checked by his PCP over the past few months.  Epic shows a longer time period of his platelet levels as below:    Ref. Range 04/22/2008 16:00 05/30/2014 16:56 11/22/2015 16:56 02/22/2020 13:41  Platelets Latest Ref Range: 150 - 400 K/uL 160 159 144 (L) 77 (L)   His hemoglobin has also been drifting down and when checked by his PCP he had a macrocytosis with a hemoglobin of 11.4 and MCV of 106 in February and a Hemoglobin of 10.5 and MCV of 109 in March.    Epic shows a longer time period of hemoglobin and MCV as noted below:    Ref. Range 05/30/2014 16:56 05/31/2014 03:29 06/01/2014 04:44 11/19/2015 13:36 11/19/2015 13:45 11/20/2015 06:32 11/22/2015 16:56 11/22/2015 17:03 11/23/2015 05:17 08/03/2018 15:15 02/22/2020 13:41  Hemoglobin Latest Ref Range: 13.0 - 17.0 g/dL 13.4 12.3 (L) 12.4 (L) 12.8 (L) 14.3 13.5 13.3 14.6 11.9 (L) 13.1 10.5 (L)    Ref. Range 04/22/2008 16:00 05/30/2014 16:56 05/31/2014 03:29 06/01/2014 04:44 11/19/2015 13:36 11/20/2015 06:32 11/22/2015 16:56 11/23/2015 05:17 02/22/2020 13:41  MCV Latest Ref Range: 80.0 - 100.0 fL 97.3 99.0 99.2 97.9 99.5 99.1 99.3 98.9 103.4 (H)    The patient's subsequent history is as detailed below.  INTERVAL HISTORY: Zacharie notes that he is  feeling well today.  He is accompanied by his neighbor who lives next door to him at [employer]  He notes that he hasn't had any increased bleeding, other than near his left ear, and that happens some when he is shaving.  He has seen Dr. Constance Holster in ENT, who has cleaned out some blood from his ears, but otherwise he has been doing well.    Chrishaun notes that he doesn't eat like he should, because he lives alone.  He dnies any new issues.     REVIEW OF SYSTEMS:  Griselda notes that he doesn't get much exercise.  He says that he doesn't sleep very well and that leads him to be fatigued. He denies any fever, chills, lymphadenopathy, bowel/bladder changes, headaches, vision issues, night sweats.  A detailed ROS was otherwise non contributory today.    PAST MEDICAL HISTORY: Past Medical History:  Diagnosis Date  . Anal fissure   . Arthritis    "left pointer" (05/30/2014)  . Atrial fibrillation ([place])   . Bradycardia   . Depression   . Hyperlipidemia   . Hypertension   . Knee pain 2015   "tore tendon left knee; no OR"  . On continuous oral anticoagulation   . Stroke ([place])   . Symptomatic bradycardia    status post dual AV permanent transvenous pacemaker insertion  . Type II diabetes mellitus ([place])     PAST SURGICAL HISTORY: Past Surgical History:  Procedure Laterality Date  .  CATARACT EXTRACTION W/ INTRAOCULAR LENS  IMPLANT, BILATERAL Bilateral 2000's  . PACEMAKER INSERTION  05/31/14   STJ dual chamber pacemaker implanted by Dr Lovena Le for symptomatic bradycardia  . PERMANENT PACEMAKER INSERTION N/A 05/31/2014   Procedure: PERMANENT PACEMAKER INSERTION;  Surgeon: Evans Lance, MD;  Location: [facility] CATH LAB;  Service: Cardiovascular;  Laterality: N/A;    FAMILY HISTORY Family History  Problem Relation Age of Onset  . Heart attack Neg Hx      SOCIAL HISTORY: Owen used to smoke cigarettes, about 1ppd x 10 years, and quit about 60 years ago.  He is widowed and lives at [facility] in  [place], [place].  He has two grown children.  His son who is a Pharmacist, community who lives in [place], and his daughter who lives at [facility] in [place], [place].  He is a retired Pharmacist, community, Administrator, arts, and Teacher, early years/pre at [employer].       ADVANCED DIRECTIVES: His daughter Belenda Cruise in [place] is his HCPOA and his son Ravon Mortellaro is his second HCPOA should Vaughan Basta be unavailable.    HEALTH MAINTENANCE: Social History   Tobacco Use  . Smoking status: Former Smoker    Years: 1.00    Types: Cigarettes  . Smokeless tobacco: Never Used  . Tobacco comment: "quit smoking in ~ 1952  Substance Use Topics  . Alcohol use: Yes    Comment: 05/30/2014 "glass of wine ~ once/month"  . Drug use: No     Colonoscopy: non recent, stopped at 86 years old   PSA: none recent, stopped at 86 years old     Allergies  Allergen Reactions  . Hctz [Hydrochlorothiazide] Other (See Comments)    Severe reaction  . Zetia [Ezetimibe] Other (See Comments)    LONG TIME AGO    Current Outpatient Medications  Medication Sig Dispense Refill  . alfuzosin (UROXATRAL) 10 MG 24 hr tablet Take 10 mg by mouth every evening.    [name] atorvastatin (LIPITOR) 20 MG tablet Take 20 mg by mouth at bedtime.  6  . Calcium Carbonate-Simethicone (ALKA-SELTZER HEARTBURN + GAS) 750-80 MG CHEW Chew 1-2 each by mouth at bedtime.    . carvedilol (COREG) 3.125 MG tablet Take 3.125 mg by mouth in the morning and at bedtime.    . citalopram (CELEXA) 20 MG tablet Take 1 tablet by mouth at bedtime.    . Coenzyme Q10 (CO Q-10) 200 MG CAPS Take 1 capsule by mouth daily.     . dabigatran (PRADAXA) 150 MG CAPS capsule Take 150 mg by mouth 2 (two) times daily.    . diclofenac sodium (VOLTAREN) 1 % GEL Apply 2 g topically as needed.    . fluorouracil (EFUDEX) 5 % cream Apply 1 application topically daily as needed (for irritation).    . furosemide (LASIX) 40 MG tablet 40 mg in the morning and at bedtime.    .  Lidocaine-Hydrocortisone Ace 3-0.5 % KIT APPLY TO RECTUM TWICE DAILY PRN    . Multiple Vitamin (MULTIVITAMIN) capsule Take 1 capsule by mouth daily.    . NON FORMULARY Place 1 drop into the left eye at bedtime. [facility] lubricant eye drops left eye only    . olmesartan (BENICAR) 40 MG tablet Take 40 mg by mouth daily.    . pimecrolimus (ELIDEL) 1 % cream Apply 1 application topically daily as needed (for face).     . sitaGLIPtin-metformin (JANUMET) 50-1000 MG per tablet Take 1 tablet by mouth 2 (two) times  daily with a meal.    . tamsulosin (FLOMAX) 0.4 MG CAPS capsule Take 0.4 mg by mouth.    . zinc oxide (BALMEX) 11.3 % CREA cream Apply 1 application topically daily as needed (for rash).    . AMBULATORY NON FORMULARY MEDICATION Medication Name: 0.125% nitroglycerin ointment to use pea sized amount per rectum along with recticare (Patient not taking: Reported on 02/22/2020) 30 g 1   No current facility-administered medications for this visit.    OBJECTIVE:  Vitals:   02/22/20 1410  BP: 134/64  Pulse: 61  Resp: 18  Temp: 99.1 F (37.3 C)  SpO2: 100%     Body mass index is 23.6 kg/m.   Wt Readings from Last 3 Encounters:  02/22/20 164 lb 8 oz (74.6 kg)  02/01/20 172 lb 6.4 oz (78.2 kg)  01/14/20 167 lb 9.6 oz (76 kg)   ECOG FS:1 - Symptomatic but completely ambulatory GENERAL: Patient is a well appearing older man in no acute distress HEENT:  Sclerae anicteric.  Mask in place. Neck is supple. Very hard of hearing. NODES:  No cervical, supraclavicular, or axillary lymphadenopathy palpated.  LUNGS:  Clear to auscultation bilaterally.  No wheezes or rhonchi. HEART:  Regular rate and rhythm. No murmur appreciated. ABDOMEN:  Soft, nontender.  Positive, normoactive bowel sounds. No organomegaly palpated. MSK:  No focal spinal tenderness to palpation.[name] EXTREMITIES:  No peripheral edema.   SKIN:  Clear with no obvious rashes or skin changes. No nail dyscrasia. NEURO:  Nonfocal. Well  oriented.  Appropriate affect.     LAB RESULTS:  CMP     Component Value Date/Time   NA 141 02/22/2020 1341   NA 140 02/10/2020 1409   K 4.1 02/22/2020 1341   CL 101 02/22/2020 1341   CO2 31 02/22/2020 1341   GLUCOSE 105 (H) 02/22/2020 1341   BUN 27 (H) 02/22/2020 1341   BUN 32 (H) 02/10/2020 1409   CREATININE 1.41 (H) 02/22/2020 1341   CALCIUM 9.3 02/22/2020 1341   PROT 7.6 02/22/2020 1341   ALBUMIN 3.9 02/22/2020 1341   AST 25 02/22/2020 1341   ALT 19 02/22/2020 1341   ALKPHOS 121 02/22/2020 1341   BILITOT 0.9 02/22/2020 1341   GFRNONAA 44 (L) 02/22/2020 1341   GFRAA 51 (L) 02/22/2020 1341    No results found for: TOTALPROTELP, ALBUMINELP, A1GS, A2GS, BETS, BETA2SER, GAMS, MSPIKE, SPEI  No results found for: KPAFRELGTCHN, LAMBDASER, KAPLAMBRATIO  Lab Results  Component Value Date   WBC 5.2 02/22/2020   NEUTROABS 3.4 02/22/2020   HGB 10.5 (L) 02/22/2020   HCT 33.1 (L) 02/22/2020   MCV 103.4 (H) 02/22/2020   PLT 77 (L) 02/22/2020      Chemistry      Component Value Date/Time   NA 141 02/22/2020 1341   NA 140 02/10/2020 1409   K 4.1 02/22/2020 1341   CL 101 02/22/2020 1341   CO2 31 02/22/2020 1341   BUN 27 (H) 02/22/2020 1341   BUN 32 (H) 02/10/2020 1409   CREATININE 1.41 (H) 02/22/2020 1341      Component Value Date/Time   CALCIUM 9.3 02/22/2020 1341   ALKPHOS 121 02/22/2020 1341   AST 25 02/22/2020 1341   ALT 19 02/22/2020 1341   BILITOT 0.9 02/22/2020 1341       No results found for: LABCA2  No components found for: WGYKZL935  No results for input(s): INR in the last 168 hours.  No results found for: LABCA2  No results found  for: YPP509  No results found for: TOI712  No results found for: WPY099  No results found for: CA2729  No components found for: HGQUANT  No results found for: CEA1 / No results found for: CEA1   No results found for: AFPTUMOR  No results found for: CHROMOGRNA  No results found for: PSA1  Appointment on  02/22/2020  Component Date Value Ref Range Status  . WBC Count 02/22/2020 5.2  4.0 - 10.5 K/uL Final  . RBC 02/22/2020 3.20* 4.22 - 5.81 MIL/uL Final  . Hemoglobin 02/22/2020 10.5* 13.0 - 17.0 g/dL Final  . HCT 02/22/2020 33.1* 39.0 - 52.0 % Final  . MCV 02/22/2020 103.4* 80.0 - 100.0 fL Final  . MCH 02/22/2020 32.8  26.0 - 34.0 pg Final  . MCHC 02/22/2020 31.7  30.0 - 36.0 g/dL Final  . RDW 02/22/2020 14.3  11.5 - 15.5 % Final  . Platelet Count 02/22/2020 77* 150 - 400 K/uL Final   EDTA platelet count consistent with citrate.  [name] nRBC 02/22/2020 0.0  0.0 - 0.2 % Final  . Neutrophils Relative % 02/22/2020 65  % Final  . Neutro Abs 02/22/2020 3.4  1.7 - 7.7 K/uL Final  . Lymphocytes Relative 02/22/2020 22  % Final  . Lymphs Abs 02/22/2020 1.1  0.7 - 4.0 K/uL Final  . Monocytes Relative 02/22/2020 10  % Final  . Monocytes Absolute 02/22/2020 0.5  0.1 - 1.0 K/uL Final  . Eosinophils Relative 02/22/2020 3  % Final  . Eosinophils Absolute 02/22/2020 0.2  0.0 - 0.5 K/uL Final  . Basophils Relative 02/22/2020 0  % Final  . Basophils Absolute 02/22/2020 0.0  0.0 - 0.1 K/uL Final  . Immature Granulocytes 02/22/2020 0  % Final  . Abs Immature Granulocytes 02/22/2020 0.01  0.00 - 0.07 K/uL Final  . Schistocytes 02/22/2020 PRESENT   Final  . Ovalocytes 02/22/2020 PRESENT   Final   Performed at [facility], [place] [address] [place], [place] [address]  . Retic Ct Pct 02/22/2020 1.0  0.4 - 3.1 % Final  . RBC. 02/22/2020 3.21* 4.22 - 5.81 MIL/uL Final  . Retic Count, Absolute 02/22/2020 31.8  19.0 - 186.0 K/uL Final  . Immature Retic Fract 02/22/2020 13.5  2.3 - 15.9 % Final   Performed at [facility], [place] [address] [place], [place] [address]  . Smear Review 02/22/2020 SMEAR STAINED AND AVAILABLE FOR REVIEW   Final   Performed at [facility], [address], [place], [place] [address]  . Sodium 02/22/2020 141  135 - 145  mmol/L Final  . Potassium 02/22/2020 4.1  3.5 - 5.1 mmol/L Final  . Chloride 02/22/2020 101  98 - 111 mmol/L Final  . CO2 02/22/2020 31  22 - 32 mmol/L Final  . Glucose, Bld 02/22/2020 105* 70 - 99 mg/dL Final   Glucose reference range applies only to samples taken after fasting for at least 8 hours.  . BUN 02/22/2020 27* 8 - 23 mg/dL Final  . Creatinine 02/22/2020 1.41* 0.61 - 1.24 mg/dL Final  . Calcium 02/22/2020 9.3  8.9 - 10.3 mg/dL Final  . Total Protein 02/22/2020 7.6  6.5 - 8.1 g/dL Final  . Albumin 02/22/2020 3.9  3.5 - 5.0 g/dL Final  . AST 02/22/2020 25  15 - 41 U/L Final  . ALT 02/22/2020 19  0 - 44 U/L Final  . Alkaline Phosphatase 02/22/2020 121  38 - 126 U/L Final  .  Total Bilirubin 02/22/2020 0.9  0.3 - 1.2 mg/dL Final  . GFR, Est Non Af Am 02/22/2020 44* >60 mL/min Final  . GFR, Est AFR Am 02/22/2020 51* >60 mL/min Final  . Anion gap 02/22/2020 9  5 - 15 Final   Performed at [facility], [place] [address] [place], [place] [address]  . CRP 02/22/2020 0.7  <1.0 mg/dL Final   Performed at [place] [address], [place], [place] [address]  . ANA Ab, IFA 02/22/2020 Negative   Final   Comment: (NOTE)                                     Negative   <1:80                                     Borderline  1:80                                     Positive   >1:80 Performed At: [facility] [place], [place] [id number] Rush Farmer MD [id number]   . Sed Rate 02/22/2020 66* 0 - 16 mm/hr Final   Performed at [place] [address], [place], [place] [address]  . Folate 02/22/2020 52.9  >5.9 ng/mL Final   Comment: RESULTS CONFIRMED BY MANUAL DILUTION Performed at [facility] [address] [place], [place] [address]   . Vitamin B-12 02/22/2020 355  180 - 914 pg/mL Final   Comment: (NOTE) This assay is not validated for testing neonatal or myeloproliferative  syndrome specimens for Vitamin B12 levels. Performed at [facility], [place] [address], [place], [place] [address]   . Ferritin 02/22/2020 103  24 - 336 ng/mL Final   Performed at [facility], [place] [address] [place], [place] [address]  . Iron 02/22/2020 53  42 - 163 ug/dL Final  . TIBC 02/22/2020 396  202 - 409 ug/dL Final  . Saturation Ratios 02/22/2020 13* 20 - 55 % Final  . UIBC 02/22/2020 343  117 - 376 ug/dL Final   Performed at [facility], [place] [address] [place], [place] [address]  . LDH 02/22/2020 198* 98 - 192 U/L Final   Performed at [facility], [place] [address], [place], [place] [address]  . Platelet CT in [place] 02/22/2020 EDTA platelet count consistent with citrate.   Final   Performed at [facility], [place] [address], [place],  [address]    (this displays the last labs from the last 3 days)  No results found for: TOTALPROTELP, ALBUMINELP, A1GS, A2GS, BETS, BETA2SER, GAMS, MSPIKE, SPEI (this displays SPEP labs)  No results found for: KPAFRELGTCHN, LAMBDASER, KAPLAMBRATIO (kappa/lambda light chains)  No results found for: HGBA, HGBA2QUANT, HGBFQUANT, HGBSQUAN (Hemoglobinopathy evaluation)   Lab Results  Component Value Date   LDH 198 (H) 02/22/2020    Lab Results  Component Value Date   IRON 53 02/22/2020   TIBC 396 02/22/2020   IRONPCTSAT 13 (L) 02/22/2020   (Iron and TIBC)  Lab Results  Component Value Date   FERRITIN 103 02/22/2020    Urinalysis    Component Value  Date/Time   COLORURINE YELLOW 04/22/2008 [place] 04/22/2008 1535   LABSPEC 1.013 04/22/2008 1535   PHURINE 8.0 04/22/2008 1535   GLUCOSEU NEGATIVE 04/22/2008 1535   HGBUR NEGATIVE 04/22/2008 1535   BILIRUBINUR NEGATIVE 04/22/2008 1535   KETONESUR NEGATIVE 04/22/2008 1535   PROTEINUR NEGATIVE 04/22/2008 1535   UROBILINOGEN 0.2 04/22/2008 1535     NITRITE NEGATIVE 04/22/2008 1535   LEUKOCYTESUR  04/22/2008 1535    NEGATIVE MICROSCOPIC NOT DONE ON URINES WITH NEGATIVE PROTEIN, BLOOD, LEUKOCYTES, NITRITE, OR GLUCOSE <1000 mg/dL.     STUDIES: ECHOCARDIOGRAM COMPLETE  Result Date: 01/29/2020    ECHOCARDIOGRAM REPORT   Patient Name:   LEMUEL BOODRAM Date of Exam: 01/28/2020 Medical Rec #:  [id number]      Height:       70.0 in Accession #:    [id number]     Weight:       167.6 lb Date of Birth:  December 06, 1927      BSA:          1.936 m Patient Age:    84 years       BP:           120/68 mmHg Patient Gender: M              HR:           62 bpm. Exam Location:  [facility] Procedure: 2D Echo, 3D Echo, Cardiac Doppler and Color Doppler Indications:    R60.0 Edema  History:        Patient has prior history of Echocardiogram examinations, most                 recent 11/20/2015. Pacemaker, Stroke, Arrythmias:Atrial                 Fibrillation and Bradycardia.; Risk Factors:Hypertension,                 Dyslipidemia, Diabetes and Former Smoker. LE edema.  Sonographer:    Jessee Avers, RDCS Referring Phys: [place]  1. Left ventricular ejection fraction, by estimation, is 40%%. The left ventricle has moderately decreased function. The left ventricle demonstrates global hypokinesis. The left ventricular internal cavity size was mildly to moderately dilated. Left ventricular diastolic parameters are indeterminate.  2. Right ventricular systolic function is mildly reduced. The right ventricular size is normal. mildly increased right ventricular wall thickness. There is moderately elevated pulmonary artery systolic pressure.  3. Left atrial size was severely dilated.  4. Right atrial size was severely dilated.  5. The mitral valve is normal in structure. Mild to moderate mitral valve regurgitation.  6. Tricuspid valve regurgitation is severe.  7. AV is thickened, calcified with restricted motion. Peak and mean gradients through the AV ae 18  and 10 mm Hg respecitvely. AVA calculated at 1.8 cm2. LVO/AV VTI ratio is 0.53 consistent with mild AS.[name] Aortic valve regurgitation is mild to moderate.  8. The inferior vena cava is dilated in size with <50% respiratory variability, suggesting right atrial pressure of 15 mmHg. Comparison(s): The left ventricular function is worsened. 11/20/15 EF 45-50%. FINDINGS  Left Ventricle: Left ventricular ejection fraction, by estimation, is 40%%. The left ventricle has moderately decreased function. The left ventricle demonstrates global hypokinesis. The left ventricular internal cavity size was mildly to moderately dilated. There is no left ventricular hypertrophy. Left ventricular diastolic parameters are indeterminate. Right Ventricle: The right ventricular size is normal. Mildly increased right ventricular wall  thickness. Right ventricular systolic function is mildly reduced. There is moderately elevated pulmonary artery systolic pressure. The tricuspid regurgitant velocity is 3.17 m/s, and with an assumed right atrial pressure of 15 mmHg, the estimated right ventricular systolic pressure is 98.9 mmHg. Left Atrium: Left atrial size was severely dilated. Right Atrium: Right atrial size was severely dilated. Pericardium: A small pericardial effusion is present. Mitral Valve: The mitral valve is normal in structure. Mild to moderate mitral valve regurgitation. Tricuspid Valve: The tricuspid valve is normal in structure. Tricuspid valve regurgitation is severe. Aortic Valve: AV is thickened, calcified with restricted motion. Peak and mean gradients through the AV ae 18 and 0 mm Hg respecitvely. AVA calculaed at 1.8 cm2. LVO/AV VTI ratio is 0.53 consistent with mild AS. The aortic valve is tricuspid. Aortic valve regurgitation is mild to moderate. Aortic regurgitation PHT measures 402 msec. Aortic valve mean gradient measures 8.8 mmHg. Aortic valve peak gradient measures 16.2 mmHg. Aortic valve area, by VTI measures 1.84 cm.  Pulmonic Valve: The pulmonic valve was normal in structure. Pulmonic valve regurgitation is trivial. Aorta: The aortic root and ascending aorta are structurally normal, with no evidence of dilitation. Venous: The inferior vena cava is dilated in size with less than 50% respiratory variability, suggesting right atrial pressure of 15 mmHg. IAS/Shunts: No atrial level shunt detected by color flow Doppler. Additional Comments: A pacer wire is visualized.  LEFT VENTRICLE PLAX 2D LVIDd:         5.50 cm  Diastology LVIDs:         4.10 cm  LV e' lateral:   7.58 cm/s LV PW:         0.90 cm  LV E/e' lateral: 11.8 LV IVS:        0.85 cm  LV e' medial:    6.15 cm/s LVOT diam:     2.10 cm  LV E/e' medial:  14.5 LV SV:         87 LV SV Index:   45 LVOT Area:     3.46 cm                          3D Volume EF:                         3D EF:        54 %                         LV EDV:       154 ml                         LV ESV:       70 ml                         LV SV:        84 ml RIGHT VENTRICLE RV Basal diam:  3.80 cm RV S prime:     11.10 cm/s TAPSE (M-mode): 1.8 cm RVSP:           55.2 mmHg LEFT ATRIUM              Index       RIGHT ATRIUM            Index LA diam:        4.90 cm  2.53 cm/m  RA Pressure: 15.00 mmHg LA Vol (A2C):   102.0 ml 52.68 ml/m RA Area:     30.70 cm LA Vol (A4C):   95.4 ml  49.27 ml/m RA Volume:   104.00 ml  53.71 ml/m LA Biplane Vol: 98.8 ml  51.03 ml/m  AORTIC VALVE AV Area (Vmax):    1.86 cm AV Area (Vmean):   1.95 cm AV Area (VTI):     1.84 cm AV Vmax:           201.00 cm/s AV Vmean:          139.600 cm/s AV VTI:            0.472 m AV Peak Grad:      16.2 mmHg AV Mean Grad:      8.8 mmHg LVOT Vmax:         108.00 cm/s LVOT Vmean:        78.400 cm/s LVOT VTI:          0.251 m LVOT/AV VTI ratio: 0.53 AI PHT:            402 msec  AORTA Ao Root diam: 3.40 cm Ao Asc diam:  3.50 cm MITRAL VALVE                 TRICUSPID VALVE                              TR Peak grad:   40.2 mmHg                               TR Vmax:        317.00 cm/s MR Peak grad:    113.2 mmHg  Estimated RAP:  15.00 mmHg MR Mean grad:    73.0 mmHg   RVSP:           55.2 mmHg MR Vmax:         532.00 cm/s MR Vmean:        407.0 cm/s  SHUNTS MR PISA:         1.01 cm    Systemic VTI:  0.25 m MR PISA Eff ROA: 7 mm       Systemic Diam: 2.10 cm MR PISA Radius:  0.40 cm MV E velocity: 89.25 cm/s MV A velocity: 46.10 cm/s MV E/A ratio:  1.94 Dorris Carnes MD Electronically signed by Dorris Carnes MD Signature Date/Time: 01/29/2020/10:15:09 PM    Final       ASSESSMENT: 86 y.o. male referred to [place] with worsening mild anemia and thrombocytopenia on anticoagulation for h/o CVA and a. Fib.    1. Anemia, mild, and likely secondary to chronic disease  2. Thrombocytopenia:   (a) IVIG trial in 2 weeks  PLAN: Hollice Espy met with myself and Dr. Jana Hakim after we reviewed his blood film.  We reviewed that all blood cells grow from the bone marrow and that there are three main types of blood cells.  1. Red blood cells are oxygen carrying cells.  Wisdom's are too few, and he has moderate anemia.  The shape of his red blood cells are normal, and none suggest dysplasia.    2.  His White blood cells, which are immune cells.  Leonards are normal in appearance and number.   3.  His platelets.  These are moderately low, but not dangerously so.  We are going to wait on his other lab results, and see him back in 2 weeks.  At that time, we will do a trial of IVIG to see if that will help increase his platelet count.  If he does not have a response then he may need a bone marrow biopsy.    Patient and his neighbor understand the above, and Dr. Jana Hakim gave him his information should his children want to call with any questions.     Total encounter time: 65 minutes  Wilber Bihari, NP 02/24/20 4:09 AM Medical Oncology and Hematology [facility] [place], [place] [address] Tel. [phone]    Fax.  [phone]   ADDENDUM: 86 y/o [place] resident with progressive thrombocytopenia and anemia in the setting of anticoagulation for atrial fibrillation, history CVA.  REVIEW OF THE BLOOD FILM 02/22/2020 is nondiagnostic: there is minimal anisocytosis, rare crenated cells, no definite dacrocytes and no nucleated RBCs, no schistocytes; mild artefactual platelet clumping on EDTA not persistent on citrate but with same numeric results; mild hypogranulation of polys w/o other dysplasia or immaturity  [place] most likely has anemia of chronic illness (normal ferritin, low iron saturation) with perhaps a minor element of occult bleeding given anticoagulation, and chronic ITP. In general these problems would not require further intervention at this point, but I am concerned if his platelets continue to fall and drop below 50K his anticoagulation might be compromised.  Accordingly he will return in about 2 weeks and we will proceed to IVIG on that date. If platelets show a robust response we have the diagnosis and the treatment (he can start NPLATE or receive IVIG intermittently as needed to keep platelets >50K). If there is no significant response then we would proceed to bone marrow biopsy to look for myelodysplasia (given in particular the elevated MCV).  Discussed with patient and information given to him in writing; also gave him my number if family members wish to call.  I personally saw this patient and performed a substantive portion of this encounter with the listed APP documented above.   Total encounter time 65 minutes.*  Lurline Del, MD Medical Oncology and Hematology [facility] [address] [place], [place] [address] Tel. [phone]    Fax. [phone]   *Total Encounter Time as defined by the Centers for Medicare and Medicaid Services includes, in addition to the face-to-face time of a patient visit (documented in the note above) non-face-to-face time:  obtaining and reviewing outside history, ordering and reviewing medications, tests or procedures, care coordination (communications with other health care professionals or caregivers) and documentation in the medical record.

## 2020-02-23 DIAGNOSIS — D693 Immune thrombocytopenic purpura: Secondary | ICD-10-CM | POA: Insufficient documentation

## 2020-02-23 DIAGNOSIS — D649 Anemia, unspecified: Secondary | ICD-10-CM | POA: Insufficient documentation

## 2020-02-23 DIAGNOSIS — D696 Thrombocytopenia, unspecified: Secondary | ICD-10-CM | POA: Insufficient documentation

## 2020-02-23 LAB — ANTINUCLEAR ANTIBODIES, IFA: ANA Ab, IFA: NEGATIVE

## 2020-02-28 ENCOUNTER — Other Ambulatory Visit: Payer: Self-pay | Admitting: Oncology

## 2020-02-29 ENCOUNTER — Telehealth: Payer: Self-pay | Admitting: Adult Health

## 2020-02-29 NOTE — Telephone Encounter (Signed)
Changed appts per patients request for later appt and GM approval per staff msg.

## 2020-03-07 ENCOUNTER — Ambulatory Visit: Payer: Medicare PPO | Admitting: Adult Health

## 2020-03-07 ENCOUNTER — Ambulatory Visit: Payer: Medicare PPO

## 2020-03-07 ENCOUNTER — Other Ambulatory Visit: Payer: Medicare PPO

## 2020-03-09 ENCOUNTER — Ambulatory Visit (INDEPENDENT_AMBULATORY_CARE_PROVIDER_SITE_OTHER): Payer: Medicare PPO | Admitting: *Deleted

## 2020-03-09 DIAGNOSIS — I639 Cerebral infarction, unspecified: Secondary | ICD-10-CM | POA: Diagnosis not present

## 2020-03-10 LAB — CUP PACEART REMOTE DEVICE CHECK
Battery Remaining Longevity: 122 mo
Battery Remaining Percentage: 95.5 %
Battery Voltage: 2.99 V
Brady Statistic RV Percent Paced: 96 %
Date Time Interrogation Session: 20210430024758
Implantable Lead Implant Date: 20150721
Implantable Lead Implant Date: 20150721
Implantable Lead Location: 753859
Implantable Lead Location: 753860
Implantable Pulse Generator Implant Date: 20150721
Lead Channel Impedance Value: 430 Ohm
Lead Channel Pacing Threshold Amplitude: 1 V
Lead Channel Pacing Threshold Pulse Width: 0.4 ms
Lead Channel Sensing Intrinsic Amplitude: 9.1 mV
Lead Channel Setting Pacing Amplitude: 2.5 V
Lead Channel Setting Pacing Pulse Width: 0.4 ms
Lead Channel Setting Sensing Sensitivity: 6 mV
Pulse Gen Model: 2240
Pulse Gen Serial Number: 7643490

## 2020-03-16 ENCOUNTER — Other Ambulatory Visit: Payer: Self-pay

## 2020-03-16 ENCOUNTER — Ambulatory Visit (INDEPENDENT_AMBULATORY_CARE_PROVIDER_SITE_OTHER): Payer: Medicare PPO | Admitting: Cardiology

## 2020-03-16 ENCOUNTER — Encounter: Payer: Self-pay | Admitting: Cardiology

## 2020-03-16 VITALS — BP 135/66 | HR 66 | Wt 167.2 lb

## 2020-03-16 DIAGNOSIS — I10 Essential (primary) hypertension: Secondary | ICD-10-CM | POA: Diagnosis not present

## 2020-03-16 DIAGNOSIS — I429 Cardiomyopathy, unspecified: Secondary | ICD-10-CM | POA: Insufficient documentation

## 2020-03-16 DIAGNOSIS — I4811 Longstanding persistent atrial fibrillation: Secondary | ICD-10-CM

## 2020-03-16 DIAGNOSIS — H9190 Unspecified hearing loss, unspecified ear: Secondary | ICD-10-CM | POA: Diagnosis not present

## 2020-03-16 DIAGNOSIS — R600 Localized edema: Secondary | ICD-10-CM | POA: Diagnosis not present

## 2020-03-16 DIAGNOSIS — N183 Chronic kidney disease, stage 3 unspecified: Secondary | ICD-10-CM | POA: Diagnosis not present

## 2020-03-16 DIAGNOSIS — Z7901 Long term (current) use of anticoagulants: Secondary | ICD-10-CM | POA: Insufficient documentation

## 2020-03-16 MED ORDER — TORSEMIDE 20 MG PO TABS
20.0000 mg | ORAL_TABLET | Freq: Two times a day (BID) | ORAL | 0 refills | Status: DC
Start: 2020-03-16 — End: 2020-05-19

## 2020-03-16 NOTE — Assessment & Plan Note (Signed)
Pradaxa 

## 2020-03-16 NOTE — Assessment & Plan Note (Signed)
B/P controlled 

## 2020-03-16 NOTE — Assessment & Plan Note (Signed)
Although the patient denies improvement on exam his LE edema seems improved and his weight is down.

## 2020-03-16 NOTE — Assessment & Plan Note (Signed)
Atrial fibrillation with slow ventricular response-PTVDP July 2015

## 2020-03-16 NOTE — Assessment & Plan Note (Signed)
Last SCr 1.41

## 2020-03-16 NOTE — Patient Instructions (Signed)
Medication Instructions:  STOP the Furosemide  START Torsemide 20 mg twice daily  *If you need a refill on your cardiac medications before your next appointment, please call your pharmacy*   Lab Work: None ordered If you have labs (blood work) drawn today and your tests are completely normal, you will receive your results only by: [name] MyChart Message (if you have MyChart) OR . A paper copy in the mail If you have any lab test that is abnormal or we need to change your treatment, we will call you to review the results.   Testing/Procedures: None ordered   Follow-Up: At [facility], you and your health needs are our priority.  As part of our continuing mission to provide you with exceptional heart care, we have created designated Provider Care Teams.  These Care Teams include your primary Cardiologist (physician) and Advanced Practice Providers (APPs -  Physician Assistants and Nurse Practitioners) who all work together to provide you with the care you need, when you need it.  We recommend signing up for the patient portal called "MyChart".  Sign up information is provided on this After Visit Summary.  MyChart is used to connect with patients for Virtual Visits (Telemedicine).  Patients are able to view lab/test results, encounter notes, upcoming appointments, etc.  Non-urgent messages can be sent to your provider as well.   To learn more about what you can do with MyChart, go to [email or web address].    Your next appointment:   Keep your follow up with Dr. Allyson Morgan on 04/18/20 at 3:00 pm

## 2020-03-16 NOTE — Assessment & Plan Note (Signed)
EF 40% March 2021- etiology undetermined

## 2020-03-16 NOTE — Progress Notes (Signed)
Cardiology Office Note:    Date:  03/16/2020   ID:  Ross Morgan, DOB [birth year], MRN [id number]  PCP:  Marton Redwood, MD  Cardiologist:  Quay Burow, MD  Electrophysiologist:  None   Referring MD: Marton Redwood, MD   CC:  "why am I here"  History of Present Illness:    Ross Morgan is a 86 y.o. male with a hx of HOH, HTN, CM with an EF of 40%, moderate MR, PTVDP 2015, chronic anticoagulation, and CRI.  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 Alvester Chou in the past for symptomatic bradycardia.  He underwent pacemaker implant by [name] July 2015 with a [facility] device.  He did have PAF at some point and was put on oral anticoagulation.  He did have a TIA on Eliquis and this was later changed to Pradaxa.   The patient saw Dr. Alvester Chou February 01, 2020 with lower extremity edema.  His diuretics had already been increased to furosemide 40 mg twice daily with little improvement.  Venous Dopplers were negative for DVT.  Echocardiogram done 01/28/2020 showed an EF of 40% with mild to moderate MR.  Dr. Gwenlyn Found added metolazone 2.5 mg every other day but the patient only received a couple doses of this, he was told to stop it.  He does have renal insufficiency, his BUN and creatinine went up on 02/10/2020 which probably is prompted the discontinuation of metolazone.  The plan was for follow-up in 1 month and is here for this now.  The patient was unsure as to why he was here today.  He says his swelling in his leg is similar.  He has 1+ edema on the right and 2+ on the left, this appears to be improved based on Dr. Blanch Media exam from earlier in March.  His weight is also down, it was 172 pounds on March 23, he is now down to 167 pounds.  Past Medical History:  Diagnosis Date  . Anal fissure   . Arthritis    "left pointer" (05/30/2014)  . Atrial fibrillation ([place])   . Bradycardia   . Depression   . Hyperlipidemia   . Hypertension   . Knee pain 2015   "tore tendon left  knee; no OR"  . On continuous oral anticoagulation   . Stroke ([place])   . Symptomatic bradycardia    status post dual AV permanent transvenous pacemaker insertion  . Type II diabetes mellitus ([place])     Past Surgical History:  Procedure Laterality Date  . CATARACT EXTRACTION W/ INTRAOCULAR LENS  IMPLANT, BILATERAL Bilateral 2000's  . PACEMAKER INSERTION  05/31/14   STJ dual chamber pacemaker implanted by Dr Lovena Le for symptomatic bradycardia  . PERMANENT PACEMAKER INSERTION N/A 05/31/2014   Procedure: PERMANENT PACEMAKER INSERTION;  Surgeon: Evans Lance, MD;  Location: [facility] CATH LAB;  Service: Cardiovascular;  Laterality: N/A;    Current Medications: Current Meds  Medication Sig  . alfuzosin (UROXATRAL) 10 MG 24 hr tablet Take 10 mg by mouth every evening.  . AMBULATORY NON FORMULARY MEDICATION Medication Name: 0.125% nitroglycerin ointment to use pea sized amount per rectum along with recticare  . atorvastatin (LIPITOR) 20 MG tablet Take 20 mg by mouth at bedtime.  . Calcium Carbonate-Simethicone (ALKA-SELTZER HEARTBURN + GAS) 750-80 MG CHEW Chew 1-2 each by mouth at bedtime.  . carvedilol (COREG) 3.125 MG tablet Take 3.125 mg by mouth in the morning and at bedtime.  . citalopram (CELEXA) 20 MG tablet Take 1  tablet by mouth at bedtime.  . Coenzyme Q10 (CO Q-10) 200 MG CAPS Take 1 capsule by mouth daily.   . dabigatran (PRADAXA) 150 MG CAPS capsule Take 150 mg by mouth 2 (two) times daily.  . diclofenac sodium (VOLTAREN) 1 % GEL Apply 2 g topically as needed.  . fluorouracil (EFUDEX) 5 % cream Apply 1 application topically daily as needed (for irritation).  . Lidocaine-Hydrocortisone Ace 3-0.5 % KIT APPLY TO RECTUM TWICE DAILY PRN  . Multiple Vitamin (MULTIVITAMIN) capsule Take 1 capsule by mouth daily.  . NON FORMULARY Place 1 drop into the left eye at bedtime. [employer] lubricant eye drops left eye only  . olmesartan (BENICAR) 40 MG tablet Take 40 mg by mouth daily.  . pimecrolimus (ELIDEL)  1 % cream Apply 1 application topically daily as needed (for face).   . sitaGLIPtin-metformin (JANUMET) 50-1000 MG per tablet Take 1 tablet by mouth 2 (two) times daily with a meal.  . tamsulosin (FLOMAX) 0.4 MG CAPS capsule Take 0.4 mg by mouth.  . zinc oxide (BALMEX) 11.3 % CREA cream Apply 1 application topically daily as needed (for rash).  . [DISCONTINUED] furosemide (LASIX) 40 MG tablet 40 mg in the morning and at bedtime.     Allergies:   Hctz [hydrochlorothiazide] and Zetia [ezetimibe]   Social History   Socioeconomic History  . Marital status: Married    Spouse name: Not on file  . Number of children: 2  . Years of education: Not on file  . Highest education level: Not on file  Occupational History  . Occupation: retired Optometrist  Tobacco Use  . Smoking status: Former Smoker    Years: 1.00    Types: Cigarettes  . Smokeless tobacco: Never Used  . Tobacco comment: "quit smoking in ~ 1952  Substance and Sexual Activity  . Alcohol use: Yes    Comment: 05/30/2014 "glass of wine ~ once/month"  . Drug use: No  . Sexual activity: Yes  Other Topics Concern  . Not on file  Social History Narrative  . Not on file   Social Determinants of Health   Financial Resource Strain:   . Difficulty of Paying Living Expenses:   Food Insecurity:   . Worried About Charity fundraiser in the Last Year:   . Arboriculturist in the Last Year:   Transportation Needs:   . Film/video editor (Medical):   [name] Lack of Transportation (Non-Medical):   Physical Activity:   . Days of Exercise per Week:   . Minutes of Exercise per Session:   Stress:   . Feeling of Stress :   Social Connections:   . Frequency of Communication with Friends and Family:   . Frequency of Social Gatherings with Friends and Family:   . Attends Religious Services:   . Active Member of Clubs or Organizations:   . Attends Archivist Meetings:   [name] Marital Status:      Family History: The patient's  family history is negative for Heart attack.  ROS:   Please see the history of present illness.     All other systems reviewed and are negative.  EKGs/Labs/Other Studies Reviewed:    The following studies were reviewed today: Echo march 2021  EKG:  EKG is not ordered today.  The ekg ordered 01/14/2020 demonstrates paced rhythm.   Recent Labs: 02/22/2020: ALT 19; BUN 27; Creatinine 1.41; Hemoglobin 10.5; Platelet Count 77; Potassium 4.1; Sodium 141  Recent Lipid Panel  Component Value Date/Time   CHOL 109 11/20/2015 0632   TRIG 52 11/20/2015 0632   HDL 28 (L) 11/20/2015 0632   CHOLHDL 3.9 11/20/2015 0632   VLDL 10 11/20/2015 0632   LDLCALC 71 11/20/2015 0354    Physical Exam:    VS:  BP 135/66   Pulse 66   Wt 167 lb 3.2 oz (75.8 kg)   SpO2 98%   BMI 23.99 kg/m     Wt Readings from Last 3 Encounters:  03/16/20 167 lb 3.2 oz (75.8 kg)  02/22/20 164 lb 8 oz (74.6 kg)  02/01/20 172 lb 6.4 oz (78.2 kg)     GEN: Well nourished, well developed in no acute distress HEENT: Normal-HOH NECK: No JVD; No carotid bruits CARDIAC: RRR, no murmurs, rubs, gallops RESPIRATORY:  Clear to auscultation without rales, wheezing or rhonchi  ABDOMEN: non-distended MUSCULOSKELETAL: 1+ edema on Rt -2+ Lt No deformity  SKIN: Warm and dry NEUROLOGIC:  Alert and oriented x 3 PSYCHIATRIC:  Normal affect   ASSESSMENT:    Bilateral lower extremity edema Although the patient denies improvement on exam his LE edema seems improved and his weight is down.   Cardiomyopathy [facility]) EF 40% March 2021- etiology undetermined  Essential hypertension B/P controlled  Atrial fibrillation, slow VR and pauses Atrial fibrillation with slow ventricular response-PTVDP July 2015  Chronic anticoagulation Pradaxa  HOH (hard of hearing) .  CRI (chronic renal insufficiency), stage 3 (moderate) Last SCr 1.41  PLAN:    I suggested we try a different diuretic- stop Lasix- start torsemide 20 mg BID.   Keep f/u with Dr Gwenlyn Found as scheduled.   Medication Adjustments/Labs and Tests Ordered: Current medicines are reviewed at length with the patient today.  Concerns regarding medicines are outlined above.  No orders of the defined types were placed in this encounter.  Meds ordered this encounter  Medications  . torsemide (DEMADEX) 20 MG tablet    Sig: Take 1 tablet (20 mg total) by mouth 2 (two) times daily.    Dispense:  180 tablet    Refill:  0    Patient Instructions  Medication Instructions:  STOP the Furosemide  START Torsemide 20 mg twice daily  *If you need a refill on your cardiac medications before your next appointment, please call your pharmacy*   Lab Work: None ordered If you have labs (blood work) drawn today and your tests are completely normal, you will receive your results only by: [name] MyChart Message (if you have MyChart) OR . A paper copy in the mail If you have any lab test that is abnormal or we need to change your treatment, we will call you to review the results.   Testing/Procedures: None ordered   Follow-Up: At [facility], you and your health needs are our priority.  As part of our continuing mission to provide you with exceptional heart care, we have created designated Provider Care Teams.  These Care Teams include your primary Cardiologist (physician) and Advanced Practice Providers (APPs -  Physician Assistants and Nurse Practitioners) who all work together to provide you with the care you need, when you need it.  We recommend signing up for the patient portal called "MyChart".  Sign up information is provided on this After Visit Summary.  MyChart is used to connect with patients for Virtual Visits (Telemedicine).  Patients are able to view lab/test results, encounter notes, upcoming appointments, etc.  Non-urgent messages can be sent to your provider as well.   To learn  more about what you can do with MyChart, go to [email or web address].    Your  next appointment:   Keep your follow up with Dr. Gwenlyn Found on 04/18/20 at 3:00 pm    Signed, Ross Ransom, PA-C  03/16/2020 [place]

## 2020-03-24 DIAGNOSIS — L814 Other melanin hyperpigmentation: Secondary | ICD-10-CM | POA: Diagnosis not present

## 2020-03-24 DIAGNOSIS — Z85828 Personal history of other malignant neoplasm of skin: Secondary | ICD-10-CM | POA: Diagnosis not present

## 2020-04-04 DIAGNOSIS — I10 Essential (primary) hypertension: Secondary | ICD-10-CM | POA: Diagnosis not present

## 2020-04-04 DIAGNOSIS — I480 Paroxysmal atrial fibrillation: Secondary | ICD-10-CM | POA: Diagnosis not present

## 2020-04-04 DIAGNOSIS — D696 Thrombocytopenia, unspecified: Secondary | ICD-10-CM | POA: Diagnosis not present

## 2020-04-04 DIAGNOSIS — D6869 Other thrombophilia: Secondary | ICD-10-CM | POA: Diagnosis not present

## 2020-04-04 DIAGNOSIS — E785 Hyperlipidemia, unspecified: Secondary | ICD-10-CM | POA: Diagnosis not present

## 2020-04-04 DIAGNOSIS — E1149 Type 2 diabetes mellitus with other diabetic neurological complication: Secondary | ICD-10-CM | POA: Diagnosis not present

## 2020-04-04 DIAGNOSIS — D539 Nutritional anemia, unspecified: Secondary | ICD-10-CM | POA: Diagnosis not present

## 2020-04-04 DIAGNOSIS — I69954 Hemiplegia and hemiparesis following unspecified cerebrovascular disease affecting left non-dominant side: Secondary | ICD-10-CM | POA: Diagnosis not present

## 2020-04-05 NOTE — Progress Notes (Addendum)
[place]  Telephone:(336) [phone] Fax:(336) [phone]     ID: Ross Morgan DOB: 01/23/1928  MR#: [id number]  VOH#:[id number]  Patient Care Team: Marton Redwood, MD as PCP - General (Internal Medicine) Lorretta Harp, MD as PCP - Cardiology (Cardiology) Magrinat, Virgie Dad, MD as Consulting Physician (Oncology) Scot Dock, NP OTHER MD:  CHIEF COMPLAINT: anemia, thrombocytopenia  CURRENT TREATMENT: trial of therapy   HISTORY OF CURRENT ILLNESS: Ross Morgan is a pleasant 86 year old man who was seen by his PCP, Dr. Brigitte Pulse for left ear bleeding.  This had happened spontaneously.  [name] has Atrial Fibrillation and a h/o CVA and is taking Pradaxa.  He also has a platelet count of 89 and 82 when checked by his PCP over the past few months.  Epic shows a longer time period of his platelet levels as below:    Ref. Range 04/22/2008 16:00 05/30/2014 16:56 11/22/2015 16:56 02/22/2020 13:41  Platelets Latest Ref Range: 150 - 400 K/uL 160 159 144 (L) 77 (L)   His hemoglobin has also been drifting down and when checked by his PCP he had a macrocytosis with a hemoglobin of 11.4 and MCV of 106 in February and a Hemoglobin of 10.5 and MCV of 109 in March.    Epic shows a longer time period of hemoglobin and MCV as noted below:    Ref. Range 05/30/2014 16:56 05/31/2014 03:29 06/01/2014 04:44 11/19/2015 13:36 11/19/2015 13:45 11/20/2015 06:32 11/22/2015 16:56 11/22/2015 17:03 11/23/2015 05:17 08/03/2018 15:15 02/22/2020 13:41  Hemoglobin Latest Ref Range: 13.0 - 17.0 g/dL 13.4 12.3 (L) 12.4 (L) 12.8 (L) 14.3 13.5 13.3 14.6 11.9 (L) 13.1 10.5 (L)    Ref. Range 04/22/2008 16:00 05/30/2014 16:56 05/31/2014 03:29 06/01/2014 04:44 11/19/2015 13:36 11/20/2015 06:32 11/22/2015 16:56 11/23/2015 05:17 02/22/2020 13:41  MCV Latest Ref Range: 80.0 - 100.0 fL 97.3 99.0 99.2 97.9 99.5 99.1 99.3 98.9 103.4 (H)    The patient's subsequent history is as detailed below.  INTERVAL HISTORY: Ross Morgan is here today for  follow up of his thrombocytopenia accompanied by his neighbor Ross Morgan. His platelets remain stable with a plt count of 83.  He continues to have a macrocytosis as well with MCV of 103.5.    He is feeling quite well.  He continues to take Pradaxa, to prevent CVA.  He did have an episode where he was chasing a bug with a dustbuster and fell into the window and cut his ear.  He bled, and finally was able to stop the bleeding with compression with a tea bag.  REVIEW OF SYSTEMS:  [name] remains stable today.  His chronic fatigue is stable, and he denies any new issues like fever, chills, cough, shortness of breath, spontaneous bleeding, headaches, nausea, vomiting, vision issues, or any further concerns.  A detailed ROS was otherwise non contributory.    PAST MEDICAL HISTORY: Past Medical History:  Diagnosis Date  . Anal fissure   . Arthritis    "left pointer" (05/30/2014)  . Atrial fibrillation ([place])   . Bradycardia   . Depression   . Hyperlipidemia   . Hypertension   . Knee pain 2015   "tore tendon left knee; no OR"  . On continuous oral anticoagulation   . Stroke ([place])   . Symptomatic bradycardia    status post dual AV permanent transvenous pacemaker insertion  . Type II diabetes mellitus ([place])     PAST SURGICAL HISTORY: Past Surgical History:  Procedure Laterality Date  . CATARACT EXTRACTION W/  INTRAOCULAR LENS  IMPLANT, BILATERAL Bilateral 2000's  . PACEMAKER INSERTION  05/31/14   STJ dual chamber pacemaker implanted by Dr Lovena Le for symptomatic bradycardia  . PERMANENT PACEMAKER INSERTION N/A 05/31/2014   Procedure: PERMANENT PACEMAKER INSERTION;  Surgeon: Evans Lance, MD;  Location[facility];  Service: Cardiovascular;  Laterality: N/A;    FAMILY HISTORY Family History  Problem Relation Age of Onset  . Heart attack Neg Hx      SOCIAL HISTORY: Ross Morgan used to smoke cigarettes, about 1ppd x 10 years, and quit about 60 years ago.  He is widowed and lives at [facility] in  [place], [place].  He has two grown children.  His son who is a Pharmacist, community who lives in [place], and his daughter who lives at [facility] in [place], [place].  He is a retired Pharmacist, community, Administrator, arts, and Teacher, early years/pre at [employer].       ADVANCED DIRECTIVES: His daughter Ross Morgan in [place] is his HCPOA and his son Ross Morgan is his second HCPOA should Ross Morgan be unavailable.    HEALTH MAINTENANCE: Social History   Tobacco Use  . Smoking status: Former Smoker    Years: 1.00    Types: Cigarettes  . Smokeless tobacco: Never Used  . Tobacco comment: "quit smoking in ~ 1952  Substance Use Topics  . Alcohol use: Yes    Comment: 05/30/2014 "glass of wine ~ once/month"  . Drug use: No     Colonoscopy: non recent, stopped at 86 years old   PSA: none recent, stopped at 86 years old     Allergies  Allergen Reactions  . Hctz [Hydrochlorothiazide] Other (See Comments)    Severe reaction  . Zetia [Ezetimibe] Other (See Comments)    LONG TIME AGO    Current Outpatient Medications  Medication Sig Dispense Refill  . B Complex-C (SUPER B COMPLEX/VITAMIN C PO) Take 1 tablet by mouth 2 (two) times daily.    . Cholecalciferol (VITAMIN D3) 25 MCG (1000 UT) CAPS Take 1 capsule by mouth 2 (two) times daily.    . potassium chloride SA (KLOR-CON) 20 MEQ tablet Take 20 mEq by mouth daily.    . AMBULATORY NON FORMULARY MEDICATION Medication Name: 0.125% nitroglycerin ointment to use pea sized amount per rectum along with recticare 30 g 1  . atorvastatin (LIPITOR) 20 MG tablet Take 20 mg by mouth at bedtime.  6  . Calcium Carbonate-Simethicone (ALKA-SELTZER HEARTBURN + GAS) 750-80 MG CHEW Chew 1-2 each by mouth at bedtime.    . carvedilol (COREG) 3.125 MG tablet Take 3.125 mg by mouth in the morning and at bedtime.    . citalopram (CELEXA) 20 MG tablet Take 1 tablet by mouth at bedtime.    . Coenzyme Q10 (CO Q-10) 200 MG CAPS Take 1 capsule by mouth daily.     . dabigatran  (PRADAXA) 150 MG CAPS capsule Take 150 mg by mouth 2 (two) times daily.    . Multiple Vitamin (MULTIVITAMIN) capsule Take 1 capsule by mouth daily.    . NON FORMULARY Place 1 drop into the left eye at bedtime. [facility] lubricant eye drops left eye only    . olmesartan (BENICAR) 40 MG tablet Take 40 mg by mouth daily.    . sitaGLIPtin-metformin (JANUMET) 50-1000 MG per tablet Take 1 tablet by mouth 2 (two) times daily with a meal.    . tamsulosin (FLOMAX) 0.4 MG CAPS capsule Take 0.4 mg by mouth.    . torsemide (  DEMADEX) 20 MG tablet Take 1 tablet (20 mg total) by mouth 2 (two) times daily. 180 tablet 0   No current facility-administered medications for this visit.    OBJECTIVE:  Vitals:   04/06/20 1205  BP: (!) 156/54  Pulse: 65  Resp: 18  Temp: 99.1 F (37.3 C)  SpO2: 97%     Body mass index is 24.31 kg/m.   Wt Readings from Last 3 Encounters:  04/06/20 169 lb 6.4 oz (76.8 kg)  03/16/20 167 lb 3.2 oz (75.8 kg)  02/22/20 164 lb 8 oz (74.6 kg)   ECOG FS:1 - Symptomatic but completely ambulatory GENERAL: Patient is a well appearing older man in no acute distress HEENT:  Sclerae anicteric.  Mask in place. Neck is supple. Very hard of hearing. NODES:  No cervical, supraclavicular, or axillary lymphadenopathy palpated.  LUNGS:  Clear to auscultation bilaterally.  No wheezes or rhonchi. HEART:  Regular rate and rhythm. No murmur appreciated. ABDOMEN:  Soft, nontender.  Positive, normoactive bowel sounds. No organomegaly palpated. MSK:  No focal spinal tenderness to palpation.[name] EXTREMITIES:  No peripheral edema.   SKIN:  Clear with no obvious rashes or skin changes. No nail dyscrasia. NEURO:  Nonfocal. Well oriented.  Appropriate affect.     LAB RESULTS:  CMP     Component Value Date/Time   NA 138 04/06/2020 1138   NA 140 02/10/2020 1409   K 5.9 (H) 04/06/2020 1138   CL 102 04/06/2020 1138   CO2 24 04/06/2020 1138   GLUCOSE 153 (H) 04/06/2020 1138   BUN 25 (H) 04/06/2020  1138   BUN 32 (H) 02/10/2020 1409   CREATININE 1.21 04/06/2020 1138   CALCIUM 7.1 (L) 04/06/2020 1138   PROT 7.2 04/06/2020 1138   ALBUMIN 3.9 04/06/2020 1138   AST 28 04/06/2020 1138   ALT 22 04/06/2020 1138   ALKPHOS 108 04/06/2020 1138   BILITOT 0.9 04/06/2020 1138   GFRNONAA 53 (L) 04/06/2020 1138   GFRAA >60 04/06/2020 1138    No results found for: TOTALPROTELP, ALBUMINELP, A1GS, A2GS, BETS, BETA2SER, GAMS, MSPIKE, SPEI  No results found for: KPAFRELGTCHN, LAMBDASER, KAPLAMBRATIO  Lab Results  Component Value Date   WBC 6.3 04/06/2020   NEUTROABS 4.4 04/06/2020   HGB 10.2 (L) 04/06/2020   HCT 32.1 (L) 04/06/2020   MCV 103.5 (H) 04/06/2020   PLT 83 (L) 04/06/2020      Chemistry      Component Value Date/Time   NA 138 04/06/2020 1138   NA 140 02/10/2020 1409   K 5.9 (H) 04/06/2020 1138   CL 102 04/06/2020 1138   CO2 24 04/06/2020 1138   BUN 25 (H) 04/06/2020 1138   BUN 32 (H) 02/10/2020 1409   CREATININE 1.21 04/06/2020 1138      Component Value Date/Time   CALCIUM 7.1 (L) 04/06/2020 1138   ALKPHOS 108 04/06/2020 1138   AST 28 04/06/2020 1138   ALT 22 04/06/2020 1138   BILITOT 0.9 04/06/2020 1138       No results found for: LABCA2  No components found for: AVWPVX480  No results for input(s): INR in the last 168 hours.  No results found for: LABCA2  No results found for: XKP537  No results found for: SMO707  No results found for: EML544  No results found for: CA2729  No components found for: HGQUANT  No results found for: CEA1 / No results found for: CEA1   No results found for: AFPTUMOR  No results found for:  Delta  No results found for: PSA1  Appointment on 04/06/2020  Component Date Value Ref Range Status  . WBC Count 04/06/2020 6.3  4.0 - 10.5 K/uL Final  . RBC 04/06/2020 3.10* 4.22 - 5.81 MIL/uL Final  . Hemoglobin 04/06/2020 10.2* 13.0 - 17.0 g/dL Final  . HCT 04/06/2020 32.1* 39.0 - 52.0 % Final  . MCV 04/06/2020 103.5*  80.0 - 100.0 fL Final  . MCH 04/06/2020 32.9  26.0 - 34.0 pg Final  . MCHC 04/06/2020 31.8  30.0 - 36.0 g/dL Final  . RDW 04/06/2020 15.5  11.5 - 15.5 % Final  . Platelet Count 04/06/2020 83* 150 - 400 K/uL Final  . nRBC 04/06/2020 0.0  0.0 - 0.2 % Final  . Neutrophils Relative % 04/06/2020 69  % Final  . Neutro Abs 04/06/2020 4.4  1.7 - 7.7 K/uL Final  . Lymphocytes Relative 04/06/2020 17  % Final  . Lymphs Abs 04/06/2020 1.1  0.7 - 4.0 K/uL Final  . Monocytes Relative 04/06/2020 10  % Final  . Monocytes Absolute 04/06/2020 0.6  0.1 - 1.0 K/uL Final  . Eosinophils Relative 04/06/2020 4  % Final  . Eosinophils Absolute 04/06/2020 0.2  0.0 - 0.5 K/uL Final  . Basophils Relative 04/06/2020 0  % Final  . Basophils Absolute 04/06/2020 0.0  0.0 - 0.1 K/uL Final  . Immature Granulocytes 04/06/2020 0  % Final  . Abs Immature Granulocytes 04/06/2020 0.01  0.00 - 0.07 K/uL Final   Performed at [facility], [place] [address] [place], [place] [address]  . Retic Ct Pct 04/06/2020 1.6  0.4 - 3.1 % Final  . RBC. 04/06/2020 3.15* 4.22 - 5.81 MIL/uL Final  . Retic Count, Absolute 04/06/2020 51.7  19.0 - 186.0 K/uL Final  . Immature Retic Fract 04/06/2020 11.2  2.3 - 15.9 % Final   Performed at [facility], [place] [address] [place], [place] [address]  . Smear Review 04/06/2020 SMEAR STAINED AND AVAILABLE FOR REVIEW   Final   Performed at [facility], [address] [place], [place] [address]  . Sodium 04/06/2020 138  135 - 145 mmol/L Final  . Potassium 04/06/2020 5.9* 3.5 - 5.1 mmol/L Final  . Chloride 04/06/2020 102  98 - 111 mmol/L Final  . CO2 04/06/2020 24  22 - 32 mmol/L Final  . Glucose, Bld 04/06/2020 153* 70 - 99 mg/dL Final   Glucose reference range applies only to samples taken after fasting for at least 8 hours.  . BUN 04/06/2020 25* 8 - 23 mg/dL Final  . Creatinine 04/06/2020 1.21  0.61 - 1.24 mg/dL Final  .  Calcium 04/06/2020 7.1* 8.9 - 10.3 mg/dL Final  . Total Protein 04/06/2020 7.2  6.5 - 8.1 g/dL Final  . Albumin 04/06/2020 3.9  3.5 - 5.0 g/dL Final  . AST 04/06/2020 28  15 - 41 U/L Final  . ALT 04/06/2020 22  0 - 44 U/L Final  . Alkaline Phosphatase 04/06/2020 108  38 - 126 U/L Final  . Total Bilirubin 04/06/2020 0.9  0.3 - 1.2 mg/dL Final  . GFR, Est Non Af Am 04/06/2020 53* >60 mL/min Final  . GFR, Est AFR Am 04/06/2020 >60  >60 mL/min Final  . Anion gap 04/06/2020 12  5 - 15 Final   Performed at [facility], [place] [address] [place], [place] [address]    (this displays the last labs from the last 3 days)  No results found for: TOTALPROTELP, ALBUMINELP, A1GS, A2GS, BETS, BETA2SER, GAMS, MSPIKE, SPEI (this displays SPEP labs)  No results found for: KPAFRELGTCHN, LAMBDASER, KAPLAMBRATIO (kappa/lambda light chains)  No results found for: HGBA, HGBA2QUANT, HGBFQUANT, HGBSQUAN (Hemoglobinopathy evaluation)   Lab Results  Component Value Date   LDH 198 (H) 02/22/2020    Lab Results  Component Value Date   IRON 53 02/22/2020   TIBC 396 02/22/2020   IRONPCTSAT 13 (L) 02/22/2020   (Iron and TIBC)  Lab Results  Component Value Date   FERRITIN 103 02/22/2020    Urinalysis    Component Value Date/Time   COLORURINE YELLOW 04/22/2008 [place] 04/22/2008 1535   LABSPEC 1.013 04/22/2008 1535   PHURINE 8.0 04/22/2008 [place] 04/22/2008 [place] 04/22/2008 [place] 04/22/2008 1535   KETONESUR NEGATIVE 04/22/2008 1535   PROTEINUR NEGATIVE 04/22/2008 1535   UROBILINOGEN 0.2 04/22/2008 1535   NITRITE NEGATIVE 04/22/2008 1535   LEUKOCYTESUR  04/22/2008 1535    NEGATIVE MICROSCOPIC NOT DONE ON URINES WITH NEGATIVE PROTEIN, BLOOD, LEUKOCYTES, NITRITE, OR GLUCOSE <1000 mg/dL.     STUDIES: CUP PACEART REMOTE DEVICE CHECK  Result Date: 03/10/2020 Scheduled remote reviewed. Normal device  function.  Next remote 91 days. Felisa Bonier, RN, MSN     ASSESSMENT: 86 y.o. male referred to [place] with worsening mild anemia and thrombocytopenia on anticoagulation for h/o CVA and a. Fib.    1. Anemia, mild, and likely secondary to chronic disease  2. Thrombocytopenia:   (a) IVIG trial on 04/13/2020  PLAN: Hollice Espy met with myself and Dr. Jana Hakim to review his repeat platlets and plans.  We reviewed that his platelets, anemia and macrocytosis all remain stable.  Dr. Jana Hakim believes that Demian has chronic ITP.    The working plan is to give Hollice Espy IVIG next week, so that we can see if his platelets respond.  If they do respond, then we have the diagnosis of ITP.  If they do not, then we will need to reconvene to talk about doing a bone marrow biopsy, or other further tests.  The purpose of doing this now, is to secure a diagnosis, while Ulysees's platelets are slightly decreased, but not so much that he is at risk for bleeding (less than 50). We reviewed that if his platelets fall below 50, and then we try the IVIG, then we have the risk of it not working if the diagnosis is not ITP and then further diagnostic work up will be delayed by a couple of weeks.    After discussion, Emmons opted to undergo the IVIG.  He will receive this on Thursday, 04/13/2020, then 2 weeks later we will check a CBC.  We will also schedule labs and an office visit every 3 months.    Patient and his neighbor understand the above, and Dr. Jana Hakim gave him his information should his children want to call with any questions.     Total encounter time: 30 minutes*  Wilber Bihari, NP 04/08/20 11:43 AM Medical Oncology and Hematology [facility] [place], [place] [address] Tel. [phone]    Fax. [phone]   ADDENDUM: I met with Javarus to go over his numbers.  His platelets are now persistently below 100,000. Results for JAUAN, WOHL (MRN [id number]) as of 04/11/2020 18:21  Ref.  Range 11/20/2015 06:32 11/22/2015 16:56 11/23/2015 05:17 02/22/2020 13:41 04/06/2020 11:38  Platelets Latest Ref Range: 150 - 400  K/uL 142 (L) 144 (L) 115 (L) 77 (L) 83 (L)   These numbers and themselves are no concern, but if they were suddenly to drop to 40,000 we would have to wonder if we should continue his systemic anticoagulation and that could have disastrous results if discontinued.  The alternative of course would be bleeding.  For that reason I urged him to go ahead and have a trial of therapy with IVIG.  We discussed the possible toxicities side effects and complications of this agent which is generally well-tolerated, and it which is very efficacious.  If his platelet counts rebounded within 2weeks then we know what to do if his platelets indeed keep dropping in the future.  We would not need to stop his anticoagulation in that case  He seems agreeable.  I have entered the appropriate orders.  He will let me know if he has any side effects from the treatment or any bleeding issues that may develop before his next visit.  I personally saw this patient and performed a substantive portion of this encounter with the listed APP documented above.   Chauncey Cruel, MD Medical Oncology and Hematology [facility] [address] [place], [place] [address] Tel. [phone]    Fax. [phone]   *Total Encounter Time as defined by the Centers for Medicare and Medicaid Services includes, in addition to the face-to-face time of a patient visit (documented in the note above) non-face-to-face time: obtaining and reviewing outside history, ordering and reviewing medications, tests or procedures, care coordination (communications with other health care professionals or caregivers) and documentation in the medical record.

## 2020-04-06 ENCOUNTER — Other Ambulatory Visit: Payer: Medicare PPO

## 2020-04-06 ENCOUNTER — Ambulatory Visit: Payer: Medicare PPO | Admitting: Oncology

## 2020-04-06 ENCOUNTER — Inpatient Hospital Stay (HOSPITAL_BASED_OUTPATIENT_CLINIC_OR_DEPARTMENT_OTHER): Payer: Medicare PPO | Admitting: Adult Health

## 2020-04-06 ENCOUNTER — Other Ambulatory Visit: Payer: Self-pay

## 2020-04-06 ENCOUNTER — Ambulatory Visit: Payer: Medicare PPO

## 2020-04-06 ENCOUNTER — Inpatient Hospital Stay: Payer: Medicare PPO | Attending: Adult Health

## 2020-04-06 VITALS — BP 156/54 | HR 65 | Temp 99.1°F | Resp 18 | Ht 70.0 in | Wt 169.4 lb

## 2020-04-06 DIAGNOSIS — D693 Immune thrombocytopenic purpura: Secondary | ICD-10-CM | POA: Diagnosis not present

## 2020-04-06 DIAGNOSIS — E1149 Type 2 diabetes mellitus with other diabetic neurological complication: Secondary | ICD-10-CM | POA: Diagnosis not present

## 2020-04-06 DIAGNOSIS — I4891 Unspecified atrial fibrillation: Secondary | ICD-10-CM | POA: Insufficient documentation

## 2020-04-06 DIAGNOSIS — Z87891 Personal history of nicotine dependence: Secondary | ICD-10-CM | POA: Diagnosis not present

## 2020-04-06 DIAGNOSIS — D649 Anemia, unspecified: Secondary | ICD-10-CM | POA: Diagnosis not present

## 2020-04-06 DIAGNOSIS — E119 Type 2 diabetes mellitus without complications: Secondary | ICD-10-CM | POA: Insufficient documentation

## 2020-04-06 DIAGNOSIS — D696 Thrombocytopenia, unspecified: Secondary | ICD-10-CM

## 2020-04-06 DIAGNOSIS — I10 Essential (primary) hypertension: Secondary | ICD-10-CM | POA: Insufficient documentation

## 2020-04-06 DIAGNOSIS — Z8673 Personal history of transient ischemic attack (TIA), and cerebral infarction without residual deficits: Secondary | ICD-10-CM | POA: Diagnosis not present

## 2020-04-06 DIAGNOSIS — D539 Nutritional anemia, unspecified: Secondary | ICD-10-CM

## 2020-04-06 LAB — CBC WITH DIFFERENTIAL (CANCER CENTER ONLY)
Abs Immature Granulocytes: 0.01 10*3/uL (ref 0.00–0.07)
Basophils Absolute: 0 10*3/uL (ref 0.0–0.1)
Basophils Relative: 0 %
Eosinophils Absolute: 0.2 10*3/uL (ref 0.0–0.5)
Eosinophils Relative: 4 %
HCT: 32.1 % — ABNORMAL LOW (ref 39.0–52.0)
Hemoglobin: 10.2 g/dL — ABNORMAL LOW (ref 13.0–17.0)
Immature Granulocytes: 0 %
Lymphocytes Relative: 17 %
Lymphs Abs: 1.1 10*3/uL (ref 0.7–4.0)
MCH: 32.9 pg (ref 26.0–34.0)
MCHC: 31.8 g/dL (ref 30.0–36.0)
MCV: 103.5 fL — ABNORMAL HIGH (ref 80.0–100.0)
Monocytes Absolute: 0.6 10*3/uL (ref 0.1–1.0)
Monocytes Relative: 10 %
Neutro Abs: 4.4 10*3/uL (ref 1.7–7.7)
Neutrophils Relative %: 69 %
Platelet Count: 83 10*3/uL — ABNORMAL LOW (ref 150–400)
RBC: 3.1 MIL/uL — ABNORMAL LOW (ref 4.22–5.81)
RDW: 15.5 % (ref 11.5–15.5)
WBC Count: 6.3 10*3/uL (ref 4.0–10.5)
nRBC: 0 % (ref 0.0–0.2)

## 2020-04-06 LAB — CMP (CANCER CENTER ONLY)
ALT: 22 U/L (ref 0–44)
AST: 28 U/L (ref 15–41)
Albumin: 3.9 g/dL (ref 3.5–5.0)
Alkaline Phosphatase: 108 U/L (ref 38–126)
Anion gap: 12 (ref 5–15)
BUN: 25 mg/dL — ABNORMAL HIGH (ref 8–23)
CO2: 24 mmol/L (ref 22–32)
Calcium: 7.1 mg/dL — ABNORMAL LOW (ref 8.9–10.3)
Chloride: 102 mmol/L (ref 98–111)
Creatinine: 1.21 mg/dL (ref 0.61–1.24)
GFR, Est AFR Am: >60 mL/min (ref >60)
GFR, Estimated: 53 mL/min — ABNORMAL LOW (ref >60)
Glucose, Bld: 153 mg/dL — ABNORMAL HIGH (ref 70–99)
Potassium: 5.9 mmol/L — ABNORMAL HIGH (ref 3.5–5.1)
Sodium: 138 mmol/L (ref 135–145)
Total Bilirubin: 0.9 mg/dL (ref 0.3–1.2)
Total Protein: 7.2 g/dL (ref 6.5–8.1)

## 2020-04-06 LAB — RETICULOCYTES
Immature Retic Fract: 11.2 % (ref 2.3–15.9)
RBC.: 3.15 MIL/uL — ABNORMAL LOW (ref 4.22–5.81)
Retic Count, Absolute: 51.7 10*3/uL (ref 19.0–186.0)
Retic Ct Pct: 1.6 % (ref 0.4–3.1)

## 2020-04-06 LAB — SAVE SMEAR(SSMR), FOR PROVIDER SLIDE REVIEW

## 2020-04-07 ENCOUNTER — Inpatient Hospital Stay: Payer: Medicare PPO

## 2020-04-07 ENCOUNTER — Telehealth: Payer: Self-pay | Admitting: Adult Health

## 2020-04-07 ENCOUNTER — Telehealth: Payer: Self-pay | Admitting: *Deleted

## 2020-04-07 NOTE — Telephone Encounter (Signed)
No 5/27 los. No changes made to pt's schedule.  

## 2020-04-07 NOTE — Telephone Encounter (Signed)
Per lab review by LCC/NP and noted elevated potassium - request to have pt stop any potassium and contact prescribing MD.  This RN called both numbers on demographic page and obtained VM for both - informing him of elevated potassium and need to stop any current supplementation.  This RN's name and return number given.  Note - this RN did not see any forms of potassium - prescription or OTC on pt's medication list.

## 2020-04-07 NOTE — Telephone Encounter (Signed)
Scheduled appt per 5/28 sch message - pt is aware of appt date and time.  

## 2020-04-08 ENCOUNTER — Encounter: Payer: Self-pay | Admitting: Adult Health

## 2020-04-11 ENCOUNTER — Telehealth: Payer: Self-pay | Admitting: Adult Health

## 2020-04-11 NOTE — Telephone Encounter (Signed)
Scheduled appt per 5/29 sch message - unable to reach pt .mailed letter with appts

## 2020-04-13 ENCOUNTER — Inpatient Hospital Stay: Payer: Medicare PPO | Attending: Adult Health

## 2020-04-13 ENCOUNTER — Other Ambulatory Visit: Payer: Self-pay

## 2020-04-13 VITALS — BP 175/71 | HR 60 | Temp 97.5°F | Resp 17

## 2020-04-13 DIAGNOSIS — D693 Immune thrombocytopenic purpura: Secondary | ICD-10-CM

## 2020-04-13 MED ORDER — ACETAMINOPHEN 325 MG PO TABS
ORAL_TABLET | ORAL | Status: AC
  Filled 2020-04-13: qty 2

## 2020-04-13 MED ORDER — IMMUNE GLOBULIN (HUMAN) 10 GM/100ML IV SOLN
1.0000 g/kg | Freq: Once | INTRAVENOUS | Status: AC
  Administered 2020-04-13: 75 g via INTRAVENOUS
  Filled 2020-04-13: qty 600

## 2020-04-13 MED ORDER — DIPHENHYDRAMINE HCL 25 MG PO CAPS
ORAL_CAPSULE | ORAL | Status: AC
  Filled 2020-04-13: qty 1

## 2020-04-13 MED ORDER — ACETAMINOPHEN 325 MG PO TABS
650.0000 mg | ORAL_TABLET | Freq: Once | ORAL | Status: AC
  Administered 2020-04-13: 650 mg via ORAL

## 2020-04-13 MED ORDER — DIPHENHYDRAMINE HCL 25 MG PO TABS
25.0000 mg | ORAL_TABLET | Freq: Once | ORAL | Status: AC
  Administered 2020-04-13: 25 mg via ORAL
  Filled 2020-04-13: qty 1

## 2020-04-13 MED ORDER — DEXTROSE 5 % IV SOLN
INTRAVENOUS | Status: DC
  Filled 2020-04-13: qty 250

## 2020-04-13 NOTE — Patient Instructions (Signed)
Immune Globulin Injection What is this medicine? IMMUNE GLOBULIN (im MUNE GLOB yoo lin) helps to prevent or reduce the severity of certain infections in patients who are at risk. This medicine is collected from the pooled blood of many donors. It is used to treat immune system problems, thrombocytopenia, and Kawasaki syndrome. This medicine may be used for other purposes; ask your health care provider or pharmacist if you have questions. COMMON BRAND NAME(S): ASCENIV, Baygam, BIVIGAM, Carimune, Carimune NF, cutaquig, Cuvitru, Flebogamma, Flebogamma DIF, GamaSTAN, GamaSTAN S/D, Gamimune N, Gammagard, Gammagard S/D, Gammaked, Gammaplex, Gammar-P IV, Gamunex, Gamunex-C, Hizentra, Iveegam, Iveegam EN, Octagam, Panglobulin, Panglobulin NF, panzyga, Polygam S/D, Privigen, Sandoglobulin, Venoglobulin-S, Vigam, Vivaglobulin, Xembify What should I tell my health care provider before I take this medicine? They need to know if you have any of these conditions:  diabetes  extremely low or no immune antibodies in the blood  heart disease  history of blood clots  hyperprolinemia  infection in the blood, sepsis  kidney disease  recently received or scheduled to receive a vaccination  an unusual or allergic reaction to human immune globulin, albumin, maltose, sucrose, other medicines, foods, dyes, or preservatives  pregnant or trying to get pregnant  breast-feeding How should I use this medicine? This medicine is for injection into a muscle or infusion into a vein or skin. It is usually given by a health care professional in a hospital or clinic setting. In rare cases, some brands of this medicine might be given at home. You will be taught how to give this medicine. Use exactly as directed. Take your medicine at regular intervals. Do not take your medicine more often than directed. Talk to your pediatrician regarding the use of this medicine in children. While this drug may be prescribed for selected  conditions, precautions do apply. Overdosage: If you think you have taken too much of this medicine contact a poison control center or emergency room at once. NOTE: This medicine is only for you. Do not share this medicine with others. What if I miss a dose? It is important not to miss your dose. Call your doctor or health care professional if you are unable to keep an appointment. If you give yourself the medicine and you miss a dose, take it as soon as you can. If it is almost time for your next dose, take only that dose. Do not take double or extra doses. What may interact with this medicine?  aspirin and aspirin-like medicines  cisplatin  cyclosporine  medicines for infection like acyclovir, adefovir, amphotericin B, bacitracin, cidofovir, foscarnet, ganciclovir, gentamicin, pentamidine, vancomycin  NSAIDS, medicines for pain and inflammation, like ibuprofen or naproxen  pamidronate  vaccines  zoledronic acid This list may not describe all possible interactions. Give your health care provider a list of all the medicines, herbs, non-prescription drugs, or dietary supplements you use. Also tell them if you smoke, drink alcohol, or use illegal drugs. Some items may interact with your medicine. What should I watch for while using this medicine? Your condition will be monitored carefully while you are receiving this medicine. This medicine is made from pooled blood donations of many different people. It may be possible to pass an infection in this medicine. However, the donors are screened for infections and all products are tested for HIV and hepatitis. The medicine is treated to kill most or all bacteria and viruses. Talk to your doctor about the risks and benefits of this medicine. Do not have vaccinations for at least   14 days before, or until at least 3 months after receiving this medicine. What side effects may I notice from receiving this medicine? Side effects that you should report  to your doctor or health care professional as soon as possible:  allergic reactions like skin rash, itching or hives, swelling of the face, lips, or tongue  blue colored lips or skin  breathing problems  chest pain or tightness  fever  signs and symptoms of aseptic meningitis such as stiff neck; sensitivity to light; headache; drowsiness; fever; nausea; vomiting; rash  signs and symptoms of a blood clot such as chest pain; shortness of breath; pain, swelling, or warmth in the leg  signs and symptoms of hemolytic anemia such as fast heartbeat; tiredness; dark yellow or brown urine; or yellowing of the eyes or skin  signs and symptoms of kidney injury like trouble passing urine or change in the amount of urine  sudden weight gain  swelling of the ankles, feet, hands Side effects that usually do not require medical attention (report to your doctor or health care professional if they continue or are bothersome):  diarrhea  flushing  headache  increased sweating  joint pain  muscle cramps  muscle pain  nausea  pain, redness, or irritation at site where injected  tiredness This list may not describe all possible side effects. Call your doctor for medical advice about side effects. You may report side effects to FDA at 1-800-FDA-1088. Where should I keep my medicine? Keep out of the reach of children. This drug is usually given in a hospital or clinic and will not be stored at home. In rare cases, some brands of this medicine may be given at home. If you are using this medicine at home, you will be instructed on how to store this medicine. Throw away any unused medicine after the expiration date on the label. NOTE: This sheet is a summary. It may not cover all possible information. If you have questions about this medicine, talk to your doctor, pharmacist, or health care provider.  2020 Elsevier/Gold Standard (2019-06-02 12:51:14)  

## 2020-04-18 ENCOUNTER — Ambulatory Visit (INDEPENDENT_AMBULATORY_CARE_PROVIDER_SITE_OTHER): Payer: Medicare PPO | Admitting: Cardiovascular Disease

## 2020-04-18 ENCOUNTER — Other Ambulatory Visit: Payer: Self-pay

## 2020-04-18 ENCOUNTER — Encounter: Payer: Self-pay | Admitting: Cardiovascular Disease

## 2020-04-18 DIAGNOSIS — I429 Cardiomyopathy, unspecified: Secondary | ICD-10-CM

## 2020-04-18 DIAGNOSIS — Z950 Presence of cardiac pacemaker: Secondary | ICD-10-CM | POA: Diagnosis not present

## 2020-04-18 DIAGNOSIS — R600 Localized edema: Secondary | ICD-10-CM | POA: Diagnosis not present

## 2020-04-18 DIAGNOSIS — E782 Mixed hyperlipidemia: Secondary | ICD-10-CM

## 2020-04-18 DIAGNOSIS — I10 Essential (primary) hypertension: Secondary | ICD-10-CM

## 2020-04-18 NOTE — Progress Notes (Signed)
04-24-2020 Ross Morgan   1928-06-15  [id number]  Primary Physician Marton Redwood, MD Primary Cardiologist: Lorretta Harp MD Lupe Carney, [place]  HPI:  Ross Morgan is a 86 y.o.  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 for evaluation of symptomatic bradycardia.I last saw him in the office3/23/2021.[name] He is accompanied by his friend Rosezella Rumpf .  He has a history of hypertension medically treated as well as hyperlipidemia. There is no other cardiovascular risk factor history. He has had bradycardia in the past. He says his blood pressure at home. He's had recent heart rates in the 30s and 40s with excessive fatigue and dizziness which is orthostatic. He denies chest pain but does have dyspnea on exertion. He had an event monitor that showed significant bradycardia and ultimately was admitted and underwent permanent pacemaker insertion by [name] on 05/31/14. He had a [employer] Jude DDD permanent pacemaker (serial [id number]) inserted. He had a 2-D echocardiogram performed on 05/06/14 which was essentially normal. His CHA2DSVASC score was 9 and he was properly anticoagulated with a novel oral anticoagulant. He feels symptomatically improved since pacemaker insertion. [name] follows his pacemaker as an outpatient.   Hehad a TIA earlier last year andwas seen by Dr. Leonie Man. CT scan was negative of his head. His Eliquis changed to Pradaxa . He isunderwentspeech therapyand had complete resolution of dysarthria but still has some left lower extremity weakness. He does complain of some mild dyspnea on exertion which has not changed in severity. His last 2-D echocardiogram performed 11/20/15 revealed EF of 45-50% with mild aortic stenosis.  Unfortunately, his wife of 62years died on April 24, 2016 she did have Parkinson's disease. [facility]   He has developed worsening lower extremity  edema. He also admits to increasing depression. Dr. Brigitte Pulse has increased his furosemide from 40 mg daily to twice daily. Apparently BNP was elevated as well. Venous Dopplers ruled out DVT. He is mildly short of breath. His last 2D echo performed 11/19/2015 revealed an EF of 45 to 50%.  I obtained a 2D echo on 01/28/2020 that showed a slight decline in LV function from 45 to 50% down to 40% with mild to moderate MR.  He does complain of some orthopnea and has 3+ pitting edema despite doubling his furosemide by Dr. Brigitte Pulse.  He is aware of salt restriction.  I did begin him on some Zaroxolyn when I saw him last which resulted in deterioration of his renal function this was discontinued.  He saw Kerin Ransom back in the office 03/16/2020 who changed his furosemide to torsemide.  His weight has remained stable.  He still has 2+ left 1+ right lower extremity pitting edema.   Current Meds  Medication Sig  . atorvastatin (LIPITOR) 20 MG tablet Take 20 mg by mouth at bedtime.  . B Complex-C (SUPER B COMPLEX/VITAMIN C PO) Take 1 tablet by mouth 2 (two) times daily.  . Calcium Carbonate Antacid (ALKA-SELTZER ANTACID PO) Take 1 tablet by mouth daily.  . Calcium Carbonate-Simethicone (ALKA-SELTZER HEARTBURN + GAS) 750-80 MG CHEW Chew 1-2 each by mouth at bedtime.  . Carboxymethylcellulose Sodium (LUBRICANT EYE DROPS OP) Apply to eye.  . carvedilol (COREG) 3.125 MG tablet Take 3.125 mg by mouth in the morning and at bedtime.  . Cholecalciferol (VITAMIN D3) 25 MCG (1000 UT) CAPS Take 1 capsule by mouth 2 (two) times daily.  . citalopram (CELEXA) 20 MG tablet  Take 1 tablet by mouth at bedtime.  . Coenzyme Q10 (CO Q-10) 200 MG CAPS Take 1 capsule by mouth daily.   . dabigatran (PRADAXA) 150 MG CAPS capsule Take 150 mg by mouth 2 (two) times daily.  . Multiple Vitamin (MULTIVITAMIN) capsule Take 1 capsule by mouth daily.  . NON FORMULARY Place 1 drop into the left eye at bedtime. [employer] lubricant eye drops left eye only   . olmesartan (BENICAR) 40 MG tablet Take 40 mg by mouth daily.  . potassium chloride SA (KLOR-CON) 20 MEQ tablet Take 20 mEq by mouth daily.  . sitaGLIPtin-metformin (JANUMET) 50-1000 MG per tablet Take 1 tablet by mouth 2 (two) times daily with a meal.  . tamsulosin (FLOMAX) 0.4 MG CAPS capsule Take 0.4 mg by mouth.  . torsemide (DEMADEX) 20 MG tablet Take 1 tablet (20 mg total) by mouth 2 (two) times daily.  . [DISCONTINUED] AMBULATORY NON FORMULARY MEDICATION Medication Name: 0.125% nitroglycerin ointment to use pea sized amount per rectum along with recticare     Allergies  Allergen Reactions  . Hctz [Hydrochlorothiazide] Other (See Comments)    Severe reaction  . Zetia [Ezetimibe] Other (See Comments)    LONG TIME AGO    Social History   Socioeconomic History  . Marital status: Married    Spouse name: Not on file  . Number of children: 2  . Years of education: Not on file  . Highest education level: Not on file  Occupational History  . Occupation: retired Biomedical scientist  Tobacco Use  . Smoking status: Former Smoker    Years: 1.00    Types: Cigarettes  . Smokeless tobacco: Never Used  . Tobacco comment: "quit smoking in ~ 1952  Substance and Sexual Activity  . Alcohol use: Yes    Comment: 05/30/2014 "glass of wine ~ once/month"  . Drug use: No  . Sexual activity: Yes  Other Topics Concern  . Not on file  Social History Narrative  . Not on file   Social Determinants of Health   Financial Resource Strain:   . Difficulty of Paying Living Expenses:   Food Insecurity:   . Worried About Programme researcher, broadcasting/film/video in the Last Year:   . Barista in the Last Year:   Transportation Needs:   . Freight forwarder (Medical):   [name] Lack of Transportation (Non-Medical):   Physical Activity:   . Days of Exercise per Week:   . Minutes of Exercise per Session:   Stress:   . Feeling of Stress :   Social Connections:   . Frequency of Communication with Friends and Family:     . Frequency of Social Gatherings with Friends and Family:   . Attends Religious Services:   . Active Member of Clubs or Organizations:   . Attends Banker Meetings:   [name] Marital Status:   Intimate Partner Violence:   . Fear of Current or Ex-Partner:   . Emotionally Abused:   [name] Physically Abused:   . Sexually Abused:      Review of Systems: General: negative for chills, fever, night sweats or weight changes.  Cardiovascular: negative for chest pain, dyspnea on exertion, edema, orthopnea, palpitations, paroxysmal nocturnal dyspnea or shortness of breath Dermatological: negative for rash Respiratory: negative for cough or wheezing Urologic: negative for hematuria Abdominal: negative for nausea, vomiting, diarrhea, bright red blood per rectum, melena, or hematemesis Neurologic: negative for visual changes, syncope, or dizziness All other systems reviewed and are otherwise  negative except as noted above.    Blood pressure 132/60, pulse 82, height 5\' 10"  (1.778 m), weight 169 lb (76.7 kg), SpO2 98 %.  General appearance: alert and no distress Neck: no adenopathy, no carotid bruit, no JVD, supple, symmetrical, trachea midline and thyroid not enlarged, symmetric, no tenderness/mass/nodules Lungs: clear to auscultation bilaterally Heart: regular rate and rhythm, S1, S2 normal, no murmur, click, rub or gallop Extremities: 2+ left, 1+ right lower extremity pitting edema Pulses: 2+ and symmetric Skin: Skin color, texture, turgor normal. No rashes or lesions Neurologic: Alert and oriented X 3, normal strength and tone. Normal symmetric reflexes. Normal coordination and gait  EKG not performed today  ASSESSMENT AND PLAN:   Essential hypertension History of essential hypertension with blood pressure measured today 132/60.  He is on carvedilol and Benicar.  Hyperlipidemia History of hyperlipidemia on statin therapy with lipid profile performed 07/22/2019 revealing total  cholesterol 106, LDL 59 HDL 36.  Pacemaker History of bradycardia with PAF status post permanent transvenous pacemaker insertion by Dr. 09/21/2019 05/31/2014 with a 06/02/2014 DDD permanent pacemaker which he follows.  He is on Pradaxa.  Bilateral lower extremity edema Bilateral lower extremity edema on furosemide in the past, and currently on torsemide.  I did briefly start him on Zaroxolyn which resulted in deterioration of his renal function.  His weight has remained stable.  He does not eat salt.  He has 2+ left and 1+ right lower extremity pitting edema.  Cardiomyopathy (HCC) History of nonischemic cardiomyopathy with an EF in the 40% range with moderate MR by recent 2D echo performed 01/28/2020.  I do not think he is a candidate for mitral valve intervention.      01/30/2020 MD FACP,FACC,FAHA, [facility] 04/18/2020 2:58 PM

## 2020-04-18 NOTE — Assessment & Plan Note (Signed)
History of bradycardia with PAF status post permanent transvenous pacemaker insertion by Dr. Ladona Ridgel 05/31/2014 with a Maxie Better DDD permanent pacemaker which he follows.  He is on Pradaxa.

## 2020-04-18 NOTE — Assessment & Plan Note (Signed)
History of essential hypertension with blood pressure measured today 132/60.  He is on carvedilol and Benicar.

## 2020-04-18 NOTE — Assessment & Plan Note (Signed)
Bilateral lower extremity edema on furosemide in the past, and currently on torsemide.  I did briefly start him on Zaroxolyn which resulted in deterioration of his renal function.  His weight has remained stable.  He does not eat salt.  He has 2+ left and 1+ right lower extremity pitting edema.

## 2020-04-18 NOTE — Patient Instructions (Signed)
Medication Instructions:  The current medical regimen is effective;  continue present plan and medications.  *If you need a refill on your cardiac medications before your next appointment, please call your pharmacy*   Follow-Up: At [facility], you and your health needs are our priority.  As part of our continuing mission to provide you with exceptional heart care, we have created designated Provider Care Teams.  These Care Teams include your primary Cardiologist (physician) and Advanced Practice Providers (APPs -  Physician Assistants and Nurse Practitioners) who all work together to provide you with the care you need, when you need it.  We recommend signing up for the patient portal called "MyChart".  Sign up information is provided on this After Visit Summary.  MyChart is used to connect with patients for Virtual Visits (Telemedicine).  Patients are able to view lab/test results, encounter notes, upcoming appointments, etc.  Non-urgent messages can be sent to your provider as well.   To learn more about what you can do with MyChart, go to [email or web address].    Your next appointment:   3 months with Corine Shelter, PA-C 6 months with Dr.Berry

## 2020-04-18 NOTE — Assessment & Plan Note (Signed)
History of hyperlipidemia on statin therapy with lipid profile performed 07/22/2019 revealing total cholesterol 106, LDL 59 HDL 36.

## 2020-04-18 NOTE — Assessment & Plan Note (Signed)
History of nonischemic cardiomyopathy with an EF in the 40% range with moderate MR by recent 2D echo performed 01/28/2020.  I do not think he is a candidate for mitral valve intervention.

## 2020-04-25 DIAGNOSIS — I739 Peripheral vascular disease, unspecified: Secondary | ICD-10-CM | POA: Diagnosis not present

## 2020-04-25 DIAGNOSIS — M21611 Bunion of right foot: Secondary | ICD-10-CM | POA: Diagnosis not present

## 2020-04-25 DIAGNOSIS — M205X2 Other deformities of toe(s) (acquired), left foot: Secondary | ICD-10-CM | POA: Diagnosis not present

## 2020-04-25 DIAGNOSIS — B351 Tinea unguium: Secondary | ICD-10-CM | POA: Diagnosis not present

## 2020-04-25 DIAGNOSIS — M205X1 Other deformities of toe(s) (acquired), right foot: Secondary | ICD-10-CM | POA: Diagnosis not present

## 2020-04-25 DIAGNOSIS — M21612 Bunion of left foot: Secondary | ICD-10-CM | POA: Diagnosis not present

## 2020-04-27 ENCOUNTER — Other Ambulatory Visit: Payer: Self-pay | Admitting: Adult Health

## 2020-04-27 ENCOUNTER — Inpatient Hospital Stay: Payer: Medicare PPO

## 2020-04-27 ENCOUNTER — Other Ambulatory Visit: Payer: Self-pay

## 2020-04-27 DIAGNOSIS — D693 Immune thrombocytopenic purpura: Secondary | ICD-10-CM | POA: Diagnosis not present

## 2020-04-27 DIAGNOSIS — D696 Thrombocytopenia, unspecified: Secondary | ICD-10-CM

## 2020-04-27 DIAGNOSIS — D539 Nutritional anemia, unspecified: Secondary | ICD-10-CM

## 2020-04-27 LAB — CMP (CANCER CENTER ONLY)
ALT: 14 U/L (ref 0–44)
AST: 21 U/L (ref 15–41)
Albumin: 3.2 g/dL — ABNORMAL LOW (ref 3.5–5.0)
Alkaline Phosphatase: 132 U/L — ABNORMAL HIGH (ref 38–126)
Anion gap: 10 (ref 5–15)
BUN: 28 mg/dL — ABNORMAL HIGH (ref 8–23)
CO2: 29 mmol/L (ref 22–32)
Calcium: 9.1 mg/dL (ref 8.9–10.3)
Chloride: 97 mmol/L — ABNORMAL LOW (ref 98–111)
Creatinine: 1.64 mg/dL — ABNORMAL HIGH (ref 0.61–1.24)
GFR, Est AFR Am: 42 mL/min — ABNORMAL LOW (ref >60)
GFR, Estimated: 37 mL/min — ABNORMAL LOW (ref >60)
Glucose, Bld: 123 mg/dL — ABNORMAL HIGH (ref 70–99)
Potassium: 4.2 mmol/L (ref 3.5–5.1)
Sodium: 136 mmol/L (ref 135–145)
Total Bilirubin: 1 mg/dL (ref 0.3–1.2)
Total Protein: 7.6 g/dL (ref 6.5–8.1)

## 2020-04-27 LAB — CBC WITH DIFFERENTIAL (CANCER CENTER ONLY)
Abs Immature Granulocytes: 0.01 10*3/uL (ref 0.00–0.07)
Basophils Absolute: 0 10*3/uL (ref 0.0–0.1)
Basophils Relative: 0 %
Eosinophils Absolute: 0.1 10*3/uL (ref 0.0–0.5)
Eosinophils Relative: 2 %
HCT: 29.5 % — ABNORMAL LOW (ref 39.0–52.0)
Hemoglobin: 9.4 g/dL — ABNORMAL LOW (ref 13.0–17.0)
Immature Granulocytes: 0 %
Lymphocytes Relative: 21 %
Lymphs Abs: 1.2 10*3/uL (ref 0.7–4.0)
MCH: 32.9 pg (ref 26.0–34.0)
MCHC: 31.9 g/dL (ref 30.0–36.0)
MCV: 103.1 fL — ABNORMAL HIGH (ref 80.0–100.0)
Monocytes Absolute: 0.8 10*3/uL (ref 0.1–1.0)
Monocytes Relative: 14 %
Neutro Abs: 3.7 10*3/uL (ref 1.7–7.7)
Neutrophils Relative %: 63 %
Platelet Count: 87 10*3/uL — ABNORMAL LOW (ref 150–400)
RBC: 2.86 MIL/uL — ABNORMAL LOW (ref 4.22–5.81)
RDW: 14.8 % (ref 11.5–15.5)
WBC Count: 5.8 10*3/uL (ref 4.0–10.5)
nRBC: 0 % (ref 0.0–0.2)

## 2020-04-27 LAB — RETICULOCYTES
Immature Retic Fract: 17 % — ABNORMAL HIGH (ref 2.3–15.9)
RBC.: 2.81 MIL/uL — ABNORMAL LOW (ref 4.22–5.81)
Retic Count, Absolute: 48.3 10*3/uL (ref 19.0–186.0)
Retic Ct Pct: 1.7 % (ref 0.4–3.1)

## 2020-04-27 LAB — SAVE SMEAR(SSMR), FOR PROVIDER SLIDE REVIEW

## 2020-05-05 ENCOUNTER — Ambulatory Visit: Payer: Medicare PPO | Admitting: Cardiovascular Disease

## 2020-05-05 DIAGNOSIS — N432 Other hydrocele: Secondary | ICD-10-CM | POA: Diagnosis not present

## 2020-05-10 ENCOUNTER — Telehealth: Payer: Self-pay | Admitting: Cardiovascular Disease

## 2020-05-10 NOTE — Telephone Encounter (Signed)
The pharmacy has been advised that the Metolazone was no longer on the patient's medication list and had been discontinued.

## 2020-05-10 NOTE — Telephone Encounter (Signed)
error 

## 2020-05-10 NOTE — Telephone Encounter (Signed)
Pt advised to no longer take metolazone but continue with torsemide 20 mg BID. Pt verbalized understanding.

## 2020-05-10 NOTE — Telephone Encounter (Signed)
New Message  Pt c/o medication issue:  1. Name of Medication:  torsemide (DEMADEX) 20 MG tablet Metolazone  2. How are you currently taking this medication (dosage and times per day)? *as directed  3. Are you having a reaction (difficulty breathing--STAT)? n/a  4. What is your medication issue? Pharmacy tech is calling in to make sure patient is supposed to be taking both medications. Please advise.

## 2020-05-10 NOTE — Telephone Encounter (Signed)
Pt called back asking what medication replaced his Metolazone. Please call

## 2020-05-15 ENCOUNTER — Encounter (HOSPITAL_COMMUNITY): Payer: Self-pay | Admitting: Emergency Medicine

## 2020-05-15 ENCOUNTER — Inpatient Hospital Stay (HOSPITAL_COMMUNITY)
Admission: EM | Admit: 2020-05-15 | Discharge: 2020-05-19 | DRG: 291 | Disposition: A | Discharge status: Home / Self Care | Payer: Medicare PPO | Attending: Family Medicine | Admitting: Family Medicine

## 2020-05-15 ENCOUNTER — Other Ambulatory Visit: Payer: Self-pay

## 2020-05-15 DIAGNOSIS — F329 Major depressive disorder, single episode, unspecified: Secondary | ICD-10-CM | POA: Diagnosis present

## 2020-05-15 DIAGNOSIS — I509 Heart failure, unspecified: Secondary | ICD-10-CM

## 2020-05-15 DIAGNOSIS — I480 Paroxysmal atrial fibrillation: Secondary | ICD-10-CM | POA: Diagnosis present

## 2020-05-15 DIAGNOSIS — I6300 Cerebral infarction due to thrombosis of unspecified precerebral artery: Secondary | ICD-10-CM | POA: Diagnosis not present

## 2020-05-15 DIAGNOSIS — Z87891 Personal history of nicotine dependence: Secondary | ICD-10-CM

## 2020-05-15 DIAGNOSIS — D696 Thrombocytopenia, unspecified: Secondary | ICD-10-CM | POA: Diagnosis present

## 2020-05-15 DIAGNOSIS — I130 Hypertensive heart and chronic kidney disease with heart failure and stage 1 through stage 4 chronic kidney disease, or unspecified chronic kidney disease: Principal | ICD-10-CM | POA: Diagnosis present

## 2020-05-15 DIAGNOSIS — Z8673 Personal history of transient ischemic attack (TIA), and cerebral infarction without residual deficits: Secondary | ICD-10-CM

## 2020-05-15 DIAGNOSIS — R600 Localized edema: Secondary | ICD-10-CM | POA: Diagnosis not present

## 2020-05-15 DIAGNOSIS — Z66 Do not resuscitate: Secondary | ICD-10-CM | POA: Diagnosis present

## 2020-05-15 DIAGNOSIS — N1832 Chronic kidney disease, stage 3b: Secondary | ICD-10-CM | POA: Diagnosis present

## 2020-05-15 DIAGNOSIS — Z7901 Long term (current) use of anticoagulants: Secondary | ICD-10-CM

## 2020-05-15 DIAGNOSIS — I5082 Biventricular heart failure: Secondary | ICD-10-CM | POA: Diagnosis present

## 2020-05-15 DIAGNOSIS — N183 Chronic kidney disease, stage 3 unspecified: Secondary | ICD-10-CM | POA: Diagnosis present

## 2020-05-15 DIAGNOSIS — I5023 Acute on chronic systolic (congestive) heart failure: Secondary | ICD-10-CM | POA: Diagnosis present

## 2020-05-15 DIAGNOSIS — Z794 Long term (current) use of insulin: Secondary | ICD-10-CM | POA: Diagnosis not present

## 2020-05-15 DIAGNOSIS — N2889 Other specified disorders of kidney and ureter: Secondary | ICD-10-CM | POA: Diagnosis present

## 2020-05-15 DIAGNOSIS — Z888 Allergy status to other drugs, medicaments and biological substances status: Secondary | ICD-10-CM

## 2020-05-15 DIAGNOSIS — N179 Acute kidney failure, unspecified: Secondary | ICD-10-CM | POA: Diagnosis present

## 2020-05-15 DIAGNOSIS — E1122 Type 2 diabetes mellitus with diabetic chronic kidney disease: Secondary | ICD-10-CM | POA: Diagnosis present

## 2020-05-15 DIAGNOSIS — E1159 Type 2 diabetes mellitus with other circulatory complications: Secondary | ICD-10-CM | POA: Diagnosis not present

## 2020-05-15 DIAGNOSIS — E118 Type 2 diabetes mellitus with unspecified complications: Secondary | ICD-10-CM | POA: Diagnosis present

## 2020-05-15 DIAGNOSIS — I10 Essential (primary) hypertension: Secondary | ICD-10-CM | POA: Diagnosis present

## 2020-05-15 DIAGNOSIS — I4891 Unspecified atrial fibrillation: Secondary | ICD-10-CM | POA: Diagnosis present

## 2020-05-15 DIAGNOSIS — E119 Type 2 diabetes mellitus without complications: Secondary | ICD-10-CM

## 2020-05-15 DIAGNOSIS — I4811 Longstanding persistent atrial fibrillation: Secondary | ICD-10-CM | POA: Diagnosis not present

## 2020-05-15 DIAGNOSIS — S7012XA Contusion of left thigh, initial encounter: Secondary | ICD-10-CM | POA: Diagnosis not present

## 2020-05-15 DIAGNOSIS — Z7984 Long term (current) use of oral hypoglycemic drugs: Secondary | ICD-10-CM | POA: Diagnosis not present

## 2020-05-15 DIAGNOSIS — N5089 Other specified disorders of the male genital organs: Secondary | ICD-10-CM | POA: Diagnosis present

## 2020-05-15 DIAGNOSIS — Z950 Presence of cardiac pacemaker: Secondary | ICD-10-CM | POA: Diagnosis not present

## 2020-05-15 DIAGNOSIS — I083 Combined rheumatic disorders of mitral, aortic and tricuspid valves: Secondary | ICD-10-CM | POA: Diagnosis present

## 2020-05-15 DIAGNOSIS — Z20822 Contact with and (suspected) exposure to covid-19: Secondary | ICD-10-CM | POA: Diagnosis present

## 2020-05-15 DIAGNOSIS — I429 Cardiomyopathy, unspecified: Secondary | ICD-10-CM | POA: Diagnosis present

## 2020-05-15 DIAGNOSIS — E785 Hyperlipidemia, unspecified: Secondary | ICD-10-CM | POA: Diagnosis present

## 2020-05-15 DIAGNOSIS — N400 Enlarged prostate without lower urinary tract symptoms: Secondary | ICD-10-CM | POA: Diagnosis present

## 2020-05-15 DIAGNOSIS — M25552 Pain in left hip: Secondary | ICD-10-CM

## 2020-05-15 DIAGNOSIS — R001 Bradycardia, unspecified: Secondary | ICD-10-CM | POA: Diagnosis present

## 2020-05-15 DIAGNOSIS — M79651 Pain in right thigh: Secondary | ICD-10-CM | POA: Diagnosis not present

## 2020-05-15 DIAGNOSIS — E782 Mixed hyperlipidemia: Secondary | ICD-10-CM | POA: Diagnosis not present

## 2020-05-15 DIAGNOSIS — I50813 Acute on chronic right heart failure: Secondary | ICD-10-CM | POA: Diagnosis not present

## 2020-05-15 DIAGNOSIS — Z79899 Other long term (current) drug therapy: Secondary | ICD-10-CM | POA: Diagnosis not present

## 2020-05-15 DIAGNOSIS — S300XXA Contusion of lower back and pelvis, initial encounter: Secondary | ICD-10-CM | POA: Diagnosis not present

## 2020-05-15 DIAGNOSIS — S79912A Unspecified injury of left hip, initial encounter: Secondary | ICD-10-CM | POA: Diagnosis not present

## 2020-05-15 DIAGNOSIS — I639 Cerebral infarction, unspecified: Secondary | ICD-10-CM | POA: Diagnosis present

## 2020-05-15 DIAGNOSIS — I447 Left bundle-branch block, unspecified: Secondary | ICD-10-CM | POA: Diagnosis present

## 2020-05-15 DIAGNOSIS — D693 Immune thrombocytopenic purpura: Secondary | ICD-10-CM | POA: Diagnosis present

## 2020-05-15 DIAGNOSIS — K5900 Constipation, unspecified: Secondary | ICD-10-CM | POA: Diagnosis not present

## 2020-05-15 DIAGNOSIS — D631 Anemia in chronic kidney disease: Secondary | ICD-10-CM | POA: Diagnosis present

## 2020-05-15 DIAGNOSIS — H9190 Unspecified hearing loss, unspecified ear: Secondary | ICD-10-CM | POA: Diagnosis present

## 2020-05-15 DIAGNOSIS — I272 Pulmonary hypertension, unspecified: Secondary | ICD-10-CM | POA: Diagnosis present

## 2020-05-15 DIAGNOSIS — R609 Edema, unspecified: Secondary | ICD-10-CM

## 2020-05-15 LAB — BASIC METABOLIC PANEL
Anion gap: 12 (ref 5–15)
BUN: 43 mg/dL — ABNORMAL HIGH (ref 8–23)
CO2: 26 mmol/L (ref 22–32)
Calcium: 8.9 mg/dL (ref 8.9–10.3)
Chloride: 97 mmol/L — ABNORMAL LOW (ref 98–111)
Creatinine, Ser: 1.7 mg/dL — ABNORMAL HIGH (ref 0.61–1.24)
GFR calc Af Amer: 41 mL/min — ABNORMAL LOW (ref >60)
GFR calc non Af Amer: 35 mL/min — ABNORMAL LOW (ref >60)
Glucose, Bld: 129 mg/dL — ABNORMAL HIGH (ref 70–99)
Potassium: 4 mmol/L (ref 3.5–5.1)
Sodium: 135 mmol/L (ref 135–145)

## 2020-05-15 LAB — HEMOGLOBIN A1C
Hgb A1c MFr Bld: 7.4 % — ABNORMAL HIGH (ref 4.8–5.6)
Mean Plasma Glucose: 165.68 mg/dL

## 2020-05-15 LAB — CBC WITH DIFFERENTIAL/PLATELET
Abs Immature Granulocytes: 0.01 10*3/uL (ref 0.00–0.07)
Basophils Absolute: 0 10*3/uL (ref 0.0–0.1)
Basophils Relative: 0 %
Eosinophils Absolute: 0.1 10*3/uL (ref 0.0–0.5)
Eosinophils Relative: 2 %
HCT: 29.4 % — ABNORMAL LOW (ref 39.0–52.0)
Hemoglobin: 9.2 g/dL — ABNORMAL LOW (ref 13.0–17.0)
Immature Granulocytes: 0 %
Lymphocytes Relative: 16 %
Lymphs Abs: 1 10*3/uL (ref 0.7–4.0)
MCH: 32.3 pg (ref 26.0–34.0)
MCHC: 31.3 g/dL (ref 30.0–36.0)
MCV: 103.2 fL — ABNORMAL HIGH (ref 80.0–100.0)
Monocytes Absolute: 0.8 10*3/uL (ref 0.1–1.0)
Monocytes Relative: 14 %
Neutro Abs: 4 10*3/uL (ref 1.7–7.7)
Neutrophils Relative %: 68 %
Platelets: 116 10*3/uL — ABNORMAL LOW (ref 150–400)
RBC: 2.85 MIL/uL — ABNORMAL LOW (ref 4.22–5.81)
RDW: 14.9 % (ref 11.5–15.5)
WBC: 5.9 10*3/uL (ref 4.0–10.5)
nRBC: 0 % (ref 0.0–0.2)

## 2020-05-15 LAB — URINALYSIS, ROUTINE W REFLEX MICROSCOPIC
Bilirubin Urine: NEGATIVE
Glucose, UA: NEGATIVE mg/dL
Hgb urine dipstick: NEGATIVE
Ketones, ur: NEGATIVE mg/dL
Leukocytes,Ua: NEGATIVE
Nitrite: NEGATIVE
Protein, ur: NEGATIVE mg/dL
Specific Gravity, Urine: 1.008 (ref 1.005–1.030)
pH: 6 (ref 5.0–8.0)

## 2020-05-15 LAB — TSH: TSH: 1.363 u[IU]/mL (ref 0.350–4.500)

## 2020-05-15 LAB — CBG MONITORING, ED: Glucose-Capillary: 120 mg/dL — ABNORMAL HIGH (ref 70–99)

## 2020-05-15 LAB — GLUCOSE, CAPILLARY
Glucose-Capillary: 128 mg/dL — ABNORMAL HIGH (ref 70–99)
Glucose-Capillary: 175 mg/dL — ABNORMAL HIGH (ref 70–99)

## 2020-05-15 LAB — SARS CORONAVIRUS 2 BY RT PCR (HOSPITAL ORDER, PERFORMED IN ~~LOC~~ HOSPITAL LAB): SARS Coronavirus 2: NEGATIVE

## 2020-05-15 MED ORDER — SITAGLIPTIN PHOS-METFORMIN HCL 50-1000 MG PO TABS: 1.0000 | ORAL_TABLET | Freq: Two times a day (BID) | ORAL | Status: DC

## 2020-05-15 MED ORDER — FUROSEMIDE 10 MG/ML IJ SOLN
60.0000 mg | Freq: Four times a day (QID) | INTRAMUSCULAR | Status: DC
  Administered 2020-05-15 – 2020-05-16 (×4): 60 mg via INTRAVENOUS
  Filled 2020-05-15: qty 6
  Filled 2020-05-15: qty 8
  Filled 2020-05-15 (×2): qty 6

## 2020-05-15 MED ORDER — INSULIN ASPART 100 UNIT/ML ~~LOC~~ SOLN
0.0000 [IU] | Freq: Three times a day (TID) | SUBCUTANEOUS | Status: DC
  Administered 2020-05-16 – 2020-05-18 (×6): 2 [IU] via SUBCUTANEOUS
  Administered 2020-05-18: 8 [IU] via SUBCUTANEOUS
  Administered 2020-05-19: 3 [IU] via SUBCUTANEOUS
  Filled 2020-05-15: qty 0.15

## 2020-05-15 MED ORDER — METOLAZONE 2.5 MG PO TABS
2.5000 mg | ORAL_TABLET | Freq: Once | ORAL | Status: AC
  Administered 2020-05-15: 2.5 mg via ORAL
  Filled 2020-05-15: qty 1

## 2020-05-15 MED ORDER — POLYETHYLENE GLYCOL 3350 17 G PO PACK
17.0000 g | PACK | Freq: Every day | ORAL | Status: DC | PRN
  Administered 2020-05-16: 17 g via ORAL
  Filled 2020-05-15: qty 1

## 2020-05-15 MED ORDER — SODIUM CHLORIDE 0.9 % IV SOLN: 250.0000 mL | INTRAVENOUS | Status: DC | PRN

## 2020-05-15 MED ORDER — DABIGATRAN ETEXILATE MESYLATE 150 MG PO CAPS
150.0000 mg | ORAL_CAPSULE | Freq: Two times a day (BID) | ORAL | Status: DC
  Administered 2020-05-15 – 2020-05-19 (×9): 150 mg via ORAL
  Filled 2020-05-15 (×9): qty 1

## 2020-05-15 MED ORDER — CARVEDILOL 3.125 MG PO TABS
3.1250 mg | ORAL_TABLET | Freq: Two times a day (BID) | ORAL | Status: DC
  Administered 2020-05-15 – 2020-05-19 (×6): 3.125 mg via ORAL
  Filled 2020-05-15 (×8): qty 1

## 2020-05-15 MED ORDER — POTASSIUM CHLORIDE CRYS ER 20 MEQ PO TBCR
40.0000 meq | EXTENDED_RELEASE_TABLET | Freq: Once | ORAL | Status: AC
  Administered 2020-05-15: 40 meq via ORAL
  Filled 2020-05-15: qty 2

## 2020-05-15 MED ORDER — METOLAZONE 2.5 MG PO TABS
2.5000 mg | ORAL_TABLET | Freq: Every day | ORAL | Status: DC
  Administered 2020-05-16: 2.5 mg via ORAL
  Filled 2020-05-15: qty 1

## 2020-05-15 MED ORDER — TAMSULOSIN HCL 0.4 MG PO CAPS
0.4000 mg | ORAL_CAPSULE | Freq: Every day | ORAL | Status: DC
  Administered 2020-05-15 – 2020-05-19 (×5): 0.4 mg via ORAL
  Filled 2020-05-15 (×5): qty 1

## 2020-05-15 MED ORDER — POTASSIUM CHLORIDE CRYS ER 20 MEQ PO TBCR
20.0000 meq | EXTENDED_RELEASE_TABLET | Freq: Every day | ORAL | Status: DC
  Administered 2020-05-16: 20 meq via ORAL
  Filled 2020-05-15: qty 1

## 2020-05-15 MED ORDER — IRBESARTAN 150 MG PO TABS
300.0000 mg | ORAL_TABLET | Freq: Every day | ORAL | Status: DC
  Administered 2020-05-15 – 2020-05-16 (×2): 300 mg via ORAL
  Filled 2020-05-15 (×2): qty 1
  Filled 2020-05-15: qty 2

## 2020-05-15 MED ORDER — SODIUM CHLORIDE 0.9% FLUSH: 3.0000 mL | INTRAVENOUS | Status: DC | PRN

## 2020-05-15 MED ORDER — METOPROLOL TARTRATE 5 MG/5ML IV SOLN: 5.0000 mg | Freq: Four times a day (QID) | INTRAVENOUS | Status: DC | PRN

## 2020-05-15 MED ORDER — LINAGLIPTIN 5 MG PO TABS
5.0000 mg | ORAL_TABLET | Freq: Two times a day (BID) | ORAL | Status: DC
  Administered 2020-05-15 – 2020-05-16 (×2): 5 mg via ORAL
  Filled 2020-05-15 (×3): qty 1

## 2020-05-15 MED ORDER — TRAMADOL HCL 50 MG PO TABS
50.0000 mg | ORAL_TABLET | Freq: Three times a day (TID) | ORAL | Status: DC | PRN
  Administered 2020-05-15 – 2020-05-17 (×6): 50 mg via ORAL
  Filled 2020-05-15 (×6): qty 1

## 2020-05-15 MED ORDER — ACETAMINOPHEN 325 MG PO TABS
650.0000 mg | ORAL_TABLET | Freq: Four times a day (QID) | ORAL | Status: DC | PRN
  Administered 2020-05-15 – 2020-05-18 (×3): 650 mg via ORAL
  Filled 2020-05-15 (×3): qty 2

## 2020-05-15 MED ORDER — METFORMIN HCL 500 MG PO TABS: 1000.0000 mg | ORAL_TABLET | Freq: Two times a day (BID) | ORAL | Status: DC

## 2020-05-15 MED ORDER — ATORVASTATIN CALCIUM 20 MG PO TABS
20.0000 mg | ORAL_TABLET | Freq: Every day | ORAL | Status: DC
  Administered 2020-05-15 – 2020-05-18 (×4): 20 mg via ORAL
  Filled 2020-05-15 (×4): qty 1

## 2020-05-15 MED ORDER — FUROSEMIDE 10 MG/ML IJ SOLN
60.0000 mg | Freq: Once | INTRAMUSCULAR | Status: AC
  Administered 2020-05-15: 60 mg via INTRAVENOUS
  Filled 2020-05-15: qty 8

## 2020-05-15 MED ORDER — CITALOPRAM HYDROBROMIDE 20 MG PO TABS
20.0000 mg | ORAL_TABLET | Freq: Every day | ORAL | Status: DC
  Administered 2020-05-15: 20 mg via ORAL
  Filled 2020-05-15 (×2): qty 1

## 2020-05-15 MED ORDER — SODIUM CHLORIDE 0.9% FLUSH
3.0000 mL | Freq: Two times a day (BID) | INTRAVENOUS | Status: DC
  Administered 2020-05-15 – 2020-05-18 (×8): 3 mL via INTRAVENOUS

## 2020-05-15 NOTE — H&P (Signed)
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 The patient has long history of peripheral edema had recently been switched from furosemide to torsemide with the addition of Zaroxolyn. This had to be stopped due to worsening renal function. His peripheral edema has been bothersome for the past several years. He feels like his left leg is worse he did have lower extremity Doppler study in February 2021, which was negative. Became worse over the last several days and has had resultant scrotal edema. This has made it to where he can no longer sit down and be comfortable. He reports his scrotum are about 3 times the normal size.  ED Course: Patient was unable to get comfortable will go from sitting to standing constantly. He was noted to have mild worsening renal function with creatinine up to 1.7, previously 1.2 in May of this year. He is also anemic with a hemoglobin of 9.2 and a platelet count of 116.  We are asked to admit him for IV diuresis, monitoring of his renal function  Review of Systems: As per HPI otherwise 10 point review of systems negative.   Past Medical History:  Diagnosis Date  . Anal fissure   . Arthritis    "left pointer" (05/30/2014)  . Atrial fibrillation (HCC)   . Bradycardia   . Depression   . Hyperlipidemia   .  Hypertension   . Knee pain 2015   "tore tendon left knee; no OR"  . On continuous oral anticoagulation   . Stroke (HCC)   . Symptomatic bradycardia    status post dual AV permanent transvenous pacemaker insertion  . Type II diabetes mellitus (HCC)     Past Surgical History:  Procedure Laterality Date  . CATARACT EXTRACTION W/ INTRAOCULAR LENS  IMPLANT, BILATERAL Bilateral 2000's  . PACEMAKER INSERTION  05/31/14   STJ dual chamber pacemaker implanted by Dr Ladona Ridgel for symptomatic bradycardia  . PERMANENT PACEMAKER INSERTION N/A 05/31/2014   Procedure: PERMANENT PACEMAKER INSERTION;  Surgeon: Marinus Maw, MD;  [facility] C[facility] LAB;  Service: Cardiovascular;  Laterality: N/A;     reports that he has quit smoking. His smoking use included cigarettes. He quit after 1.00 year of use. He has never used smokeless tobacco. He reports current alcohol use. He reports that he does not use drugs.  Allergies  Allergen Reactions  . Hctz [Hydrochlorothiazide] Other (See Comments)    Severe reaction  . Zetia [Ezetimibe] Other (See Comments)    LONG TIME AGO    Family History  Problem Relation Age of Onset  . Heart attack Neg Hx     Prior to Admission medications   Medication Sig Start Date End Date Taking? Authorizing Provider  atorvastatin (LIPITOR) 20 MG tablet Take 20 mg by mouth at bedtime. 06/22/15   [provider]  B Complex-C (SUPER B COMPLEX/VITAMIN C PO) Take 1 tablet by mouth 2 (two) times daily.  [provider]  Calcium Carbonate Antacid (ALKA-SELTZER ANTACID PO) Take 1 tablet by mouth daily.    [provider]  Calcium Carbonate-Simethicone (ALKA-SELTZER HEARTBURN + GAS) 750-80 MG CHEW Chew 1-2 each by mouth at bedtime.    [provider]  Carboxymethylcellulose Sodium (LUBRICANT EYE DROPS OP) Apply to eye.    [provider]  carvedilol (COREG) 3.125 MG tablet Take 3.125 mg by mouth in the morning and at bedtime. 01/08/20    [provider]  Cholecalciferol (VITAMIN D3) 25 MCG (1000 UT) CAPS Take 1 capsule by mouth 2 (two) times daily.    [provider]  citalopram (CELEXA) 20 MG tablet Take 1 tablet by mouth at bedtime. 04/23/19   [provider]  Coenzyme Q10 (CO Q-10) 200 MG CAPS Take 1 capsule by mouth daily.     [provider]  dabigatran (PRADAXA) 150 MG CAPS capsule Take 150 mg by mouth 2 (two) times daily.    [provider]  Multiple Vitamin (MULTIVITAMIN) capsule Take 1 capsule by mouth daily.    [provider]  NON FORMULARY Place 1 drop into the left eye at bedtime. [employer] lubricant eye drops left eye only    [provider]  olmesartan (BENICAR) 40 MG tablet Take 40 mg by mouth daily.    [provider]  potassium chloride SA (KLOR-CON) 20 MEQ tablet Take 20 mEq by mouth daily.    [provider]  sitaGLIPtin-metformin (JANUMET) 50-1000 MG per tablet Take 1 tablet by mouth 2 (two) times daily with a meal.    [provider]  tamsulosin (FLOMAX) 0.4 MG CAPS capsule Take 0.4 mg by mouth.    [provider]  torsemide (DEMADEX) 20 MG tablet Take 1 tablet (20 mg total) by mouth 2 (two) times daily. 03/16/20 06/14/20  Erlene Quan, PA-C    Physical Exam: Vitals:   05/15/20 9629 05/15/20 5284 05/15/20 0750 05/15/20 0829  BP: 132/67  (!) 148/57 135/61  Pulse: 64     Resp: 18  19   Temp: 97.6 F (36.4 C)  98.5 F (36.9 C)   TempSrc: Oral  Oral   SpO2: 96%  95%   Weight:  76.2 kg    Height:  '5\' 10"'$  (1.778 m)      Constitutional: NAD, calm, appears uncomfortable, appears stated age Eyes: PERRL, lids and conjunctivae normal ENMT: Mucous membranes are moist. Posterior pharynx clear of any exudate or lesions.Normal dentition.  Neck: normal, supple, no masses, no thyromegaly Respiratory: clear to auscultation bilaterally, no wheezing, no crackles. Normal respiratory effort. No accessory muscle use.    Cardiovascular: Regular rate and rhythm, 2/6 systolic murmurs, no rubs or gallops. 3+ pedal edema up to his thighs and lower abdomen, involving the scrotal area.. 2+ pedal pulses. No carotid bruits.  Abdomen: no tenderness, no masses palpated. No hepatosplenomegaly. Bowel sounds positive.  Musculoskeletal: no clubbing / cyanosis. No joint deformity upper and lower extremities. Good ROM, no contractures. Normal muscle tone. Ecchymosis over the sacrum left buttock and left proximal, posterior thigh Skin: no rashes, lesions, ulcers. No induration Neurologic: Grossly intact. Psychiatric: Normal judgment and insight. Alert and oriented x 3. Normal mood.   Labs on Admission: I have personally reviewed following labs and imaging studies  CBC: Recent Labs  Lab 05/15/20 0652  WBC 5.9  NEUTROABS 4.0  HGB 9.2*  HCT 29.4*  MCV 103.2*  PLT 132*   Basic Metabolic Panel: Recent Labs  Lab 05/15/20 [phone]  NA 135  K 4.0  CL 97*  CO2 26  GLUCOSE 129*  BUN 43*  CREATININE 1.70*  CALCIUM 8.9   Urine analysis:    Component Value Date/Time   COLORURINE YELLOW 04/22/2008 1535   APPEARANCEUR CLEAR 04/22/2008 1535   LABSPEC 1.013 04/22/2008 1535   PHURINE 8.0 04/22/2008 1535   GLUCOSEU NEGATIVE 04/22/2008 1535   HGBUR NEGATIVE 04/22/2008 1535   BILIRUBINUR NEGATIVE 04/22/2008 1535   KETONESUR NEGATIVE 04/22/2008 1535   PROTEINUR NEGATIVE 04/22/2008 1535   UROBILINOGEN 0.2 04/22/2008 1535   NITRITE NEGATIVE 04/22/2008 1535   LEUKOCYTESUR  04/22/2008 1535    NEGATIVE MICROSCOPIC NOT DONE ON URINES WITH NEGATIVE PROTEIN, BLOOD, LEUKOCYTES, NITRITE, OR GLUCOSE <1000 mg/dL.    EKG: Independently reviewed. Junctional rhythm with a rate of 62, left atrial enlargement, left ventricular hypertrophy old anterior, inferior infarct appears grossly unchanged from prior  Assessment/Plan Principal Problem:   Scrotal edema Active Problems:   Essential hypertension   Hyperlipidemia   Bradycardia,  sinus   Atrial fibrillation, slow VR and pauses   Type II diabetes mellitus (HCC)   Stroke (cerebrum) (HCC)   BPH (benign prostatic hyperplasia)   Diabetes mellitus with complication (HCC)   Bilateral lower extremity edema   Chronic ITP (idiopathic thrombocytopenia) (HCC)   Chronic anticoagulation   HOH (hard of hearing)   CRI (chronic renal insufficiency), stage 3 (moderate)  Worsening peripheral edema/scrotal edema Will begin Lasix 60 mg every 6 hours for 24 hours. Additional Zaroxolyn given in the ED and 1 more dose scheduled for the morning.  Strict I's and O's Daily weights Check TSH  Cardiomyopathy Last EF was 40%, mild reduction in right ventricular function with moderate mitral regurgitation, mild to moderate aortic regurgitation, left and right atrial enlargement If there is not improvement in his symptoms, consider repeating this echo May need to consider cardiology consult for further medical management.   Chronic renal insufficiency stage IIIb Despite backing off on his diuretics, his renal function has worsened a bit. This will have to be weighed against the benefits of pulling some fluid off of him.  Essential hypertension BP is well controlled on Benicar, carvedilol-continue these  Hyperlipidemia Continue Lipitor  Bradycardia Has permanent pacemaker in place On Pradaxa  Type 2 diabetes On Janumet Check A1c Sliding scale insulin  History of stroke On Pradaxa  BPH Continue Flomax  Chronic ITP Status post IVIG in late April or early May  Anemia of chronic disease He has a normal ferritin with low iron saturation, normal B12 and folate. Has hematology who has planned potential bone marrow biopsy if worsening.   DVT prophylaxis: ZS:MOLMBEM  Code Status: DNR confirmed with patient Family Communication: Patient at bedside, daughter at bedside Disposition Plan: Home Consults called: None Admission status: Inpatient   Donnamae Jude MD [facility]  If 7PM-7AM, please contact night-coverage 05/15/2020, 9:42 AM

## 2020-05-15 NOTE — ED Triage Notes (Signed)
Pt arrived via EMS. Pt reports that he has swelling in his scrotum for the past week. Pt reports that he has no pain or discomfort, just swelling in his scrotum. Home health nurse reports a fall a few days ago. Pt has swelling in his lower legs and feet. Pt reports difficulty ambulating at home.

## 2020-05-15 NOTE — ED Notes (Signed)
Report given to Sharma, RN

## 2020-05-15 NOTE — Progress Notes (Addendum)
NT reports hearing Patient fall at 1950.  Observed Patient sitting on floor near door.  Denied hitting head or new injury. No new pain or injury observed.  Patient had refused fall interventions at shift change and had been standing by counter D/T scrotal swelling and discomfort of sitting or being in bed. 2019 Dtr Misty Stanley updated to above. 2025 M.Katherina Right updated and will come to evaluate when able.  2035 Carola Frost updated and agreeable for low bed, fall mats, bed/chair alarms.  Writer will request a 24 hr sitter or Tele-sitter from supervisor for safety D/T Frequent needs to mobilize and urinate since on Lasix 60 mg IV Q 6hrs.  2050 Supervisor again updated agreeable to Tele-sitter. Does not have a NT for 24 hour supervision.  Suggested a family member may stay if desired.

## 2020-05-15 NOTE — ED Notes (Signed)
Blue top, light green, dark green, and lavender sent to lab.

## 2020-05-15 NOTE — ED Notes (Signed)
Patient has difficulty laying down. Placed a recliner in room to sit on instead.

## 2020-05-15 NOTE — Plan of Care (Signed)
Plan of care discussed.   

## 2020-05-15 NOTE — ED Provider Notes (Signed)
[facility]-EMERGENCY DEPT Provider Note   CSN: [id number] Arrival date & time: 05/15/20  9323     History Chief Complaint  Patient presents with  . Groin Swelling    Ross Morgan is a 86 y.o. male.  HPI   89yM presenting because "my balls blew up." Has chronic LE edema. This has been increasing and now also having progressive scrotal edema. Uncomfortable to the point of being unable to sit. When I entered the room he was standing beside the bed holding onto the rail. Legs feel very heavy and having a hard time getting around. Feel recently and having pain in L buttock. "They told me it was just bruised." Denies acute pain otherwise. No respiratory complaints. Reports compliance with medications.   Past Medical History:  Diagnosis Date  . Anal fissure   . Arthritis    "left pointer" (05/30/2014)  . Atrial fibrillation (HCC)   . Bradycardia   . Depression   . Hyperlipidemia   . Hypertension   . Knee pain 2015   "tore tendon left knee; no OR"  . On continuous oral anticoagulation   . Stroke (HCC)   . Symptomatic bradycardia    status post dual AV permanent transvenous pacemaker insertion  . Type II diabetes mellitus [facility])     Patient Active Problem List   Diagnosis Date Noted  . Cardiomyopathy (HCC) 03/16/2020  . Chronic anticoagulation 03/16/2020  . HOH (hard of hearing) 03/16/2020  . CRI (chronic renal insufficiency), stage 3 (moderate) 03/16/2020  . Chronic ITP (idiopathic thrombocytopenia) (HCC) 02/23/2020  . Thrombocytopenia (HCC) 02/23/2020  . Anemia 02/23/2020  . Bilateral lower extremity edema 01/14/2020  . Left-sided weakness 11/22/2015  . Acute CVA (cerebrovascular accident) (HCC) 11/22/2015  . Stroke (cerebrum) (HCC) 11/19/2015  . Paroxysmal A-fib (HCC) 11/19/2015  . BPH (benign prostatic hyperplasia) 11/19/2015  . Cerebrovascular accident (CVA) (HCC)   . Diabetes mellitus with complication (HCC)   . Pacemaker 09/29/2014  . Type II  diabetes mellitus (HCC)   . Atrial fibrillation, slow VR and pauses 05/30/2014  . Essential hypertension 04/29/2014  . Hyperlipidemia 04/29/2014  . Bradycardia, sinus 04/29/2014    Past Surgical History:  Procedure Laterality Date  . CATARACT EXTRACTION W/ INTRAOCULAR LENS  IMPLANT, BILATERAL Bilateral 2000's  . PACEMAKER INSERTION  05/31/14   STJ dual chamber pacemaker implanted by Dr Ladona Ridgel for symptomatic bradycardia  . PERMANENT PACEMAKER INSERTION N/A 05/31/2014   Procedure: PERMANENT PACEMAKER INSERTION;  Surgeon: Marinus Maw, MD;  Location: [facility] CATH LAB;  Service: Cardiovascular;  Laterality: N/A;       Family History  Problem Relation Age of Onset  . Heart attack Neg Hx     Social History   Tobacco Use  . Smoking status: Former Smoker    Years: 1.00    Types: Cigarettes  . Smokeless tobacco: Never Used  . Tobacco comment: "quit smoking in ~ 1952  Vaping Use  . Vaping Use: Never used  Substance Use Topics  . Alcohol use: Yes    Comment: 05/30/2014 "glass of wine ~ once/month"  . Drug use: No    Home Medications Prior to Admission medications   Medication Sig Start Date End Date Taking? Authorizing Provider  atorvastatin (LIPITOR) 20 MG tablet Take 20 mg by mouth at bedtime. 06/22/15   [provider]  B Complex-C (SUPER B COMPLEX/VITAMIN C PO) Take 1 tablet by mouth 2 (two) times daily.    [provider]  Calcium Carbonate Antacid (ALKA-SELTZER  ANTACID PO) Take 1 tablet by mouth daily.    [provider]  Calcium Carbonate-Simethicone (ALKA-SELTZER HEARTBURN + GAS) 750-80 MG CHEW Chew 1-2 each by mouth at bedtime.    [provider]  Carboxymethylcellulose Sodium (LUBRICANT EYE DROPS OP) Apply to eye.    [provider]  carvedilol (COREG) 3.125 MG tablet Take 3.125 mg by mouth in the morning and at bedtime. 01/08/20   [provider]  Cholecalciferol (VITAMIN D3) 25 MCG (1000 UT) CAPS Take 1 capsule by mouth 2  (two) times daily.    [provider]  citalopram (CELEXA) 20 MG tablet Take 1 tablet by mouth at bedtime. 04/23/19   [provider]  Coenzyme Q10 (CO Q-10) 200 MG CAPS Take 1 capsule by mouth daily.     [provider]  dabigatran (PRADAXA) 150 MG CAPS capsule Take 150 mg by mouth 2 (two) times daily.    [provider]  Multiple Vitamin (MULTIVITAMIN) capsule Take 1 capsule by mouth daily.    [provider]  NON FORMULARY Place 1 drop into the left eye at bedtime. [employer] lubricant eye drops left eye only    [provider]  olmesartan (BENICAR) 40 MG tablet Take 40 mg by mouth daily.    [provider]  potassium chloride SA (KLOR-CON) 20 MEQ tablet Take 20 mEq by mouth daily.    [provider]  sitaGLIPtin-metformin (JANUMET) 50-1000 MG per tablet Take 1 tablet by mouth 2 (two) times daily with a meal.    [provider]  tamsulosin (FLOMAX) 0.4 MG CAPS capsule Take 0.4 mg by mouth.    [provider]  torsemide (DEMADEX) 20 MG tablet Take 1 tablet (20 mg total) by mouth 2 (two) times daily. 03/16/20 06/14/20  Abelino Derrick, PA-C    Allergies    Hctz [hydrochlorothiazide] and Zetia [ezetimibe]  Review of Systems   Review of Systems All systems reviewed and negative, other than as noted in HPI.  Physical Exam Updated Vital Signs BP 132/67   Pulse 64   Temp 97.6 F (36.4 C) (Oral)   Resp 18   Ht 5\' 10"  (1.778 m)   Wt 76.2 kg   SpO2 96%   BMI 24.11 kg/m   Physical Exam Vitals and nursing note reviewed.  Constitutional:      General: He is not in acute distress.    Appearance: He is well-developed.  HENT:     Head: Normocephalic and atraumatic.  Eyes:     General:        Right eye: No discharge.        Left eye: No discharge.     Conjunctiva/sclera: Conjunctivae normal.  Cardiovascular:     Rate and Rhythm: Normal rate and regular rhythm.     Heart sounds: Normal heart sounds. No murmur  heard.  No friction rub. No gallop.   Pulmonary:     Effort: Pulmonary effort is normal. No respiratory distress.     Breath sounds: Normal breath sounds.  Abdominal:     General: There is no distension.     Palpations: Abdomen is soft.     Tenderness: There is no abdominal tenderness.  Musculoskeletal:        General: Signs of injury present. No tenderness.     Cervical back: Neck supple.     Comments: Ecchymosis over sacrum, L buttock and L proximal/posterior thigh. Pitting edema b/l. Scrotal edema.   Skin:    General:  Skin is warm and dry.  Neurological:     Mental Status: He is alert.  Psychiatric:        Behavior: Behavior normal.        Thought Content: Thought content normal.     ED Results / Procedures / Treatments   Labs (all labs ordered are listed, but only abnormal results are displayed) Labs Reviewed  CBC WITH DIFFERENTIAL/PLATELET - Abnormal; Notable for the following components:      Result Value   RBC 2.85 (*)    Hemoglobin 9.2 (*)    HCT 29.4 (*)    MCV 103.2 (*)    Platelets 116 (*)    All other components within normal limits  BASIC METABOLIC PANEL - Abnormal; Notable for the following components:   Chloride 97 (*)    Glucose, Bld 129 (*)    BUN 43 (*)    Creatinine, Ser 1.70 (*)    GFR calc non Af Amer 35 (*)    GFR calc Af Amer 41 (*)    All other components within normal limits  HEMOGLOBIN A1C - Abnormal; Notable for the following components:   Hgb A1c MFr Bld 7.4 (*)    All other components within normal limits  COMPREHENSIVE METABOLIC PANEL - Abnormal; Notable for the following components:   Sodium 134 (*)    Chloride 94 (*)    Glucose, Bld 125 (*)    BUN 44 (*)    Creatinine, Ser 1.52 (*)    Albumin 3.1 (*)    Total Bilirubin 1.5 (*)    GFR calc non Af Amer 40 (*)    GFR calc Af Amer 46 (*)    All other components within normal limits  GLUCOSE, CAPILLARY - Abnormal; Notable for the following components:   Glucose-Capillary 128 (*)     All other components within normal limits  GLUCOSE, CAPILLARY - Abnormal; Notable for the following components:   Glucose-Capillary 175 (*)    All other components within normal limits  CBG MONITORING, ED - Abnormal; Notable for the following components:   Glucose-Capillary 120 (*)    All other components within normal limits  SARS CORONAVIRUS 2 BY RT PCR (HOSPITAL ORDER, PERFORMED IN [facility])  URINALYSIS, ROUTINE W REFLEX MICROSCOPIC  TSH    EKG None  Radiology No results found.  Procedures Procedures (including critical care time)  Medications Ordered in ED Medications - No data to display  ED Course  I have reviewed the triage vital signs and the nursing notes.  Pertinent labs & imaging results that were available during my care of the patient were reviewed by me and considered in my medical decision making (see chart for details).    MDM Rules/Calculators/A&P                          89yM with progressive edema to point of significant scrotal swelling and into lower abdomen. Difficulty walking because of heaviness in legs and discomfort in scrotum. Recently fell and has significant bruising to L buttock/thigh. He is on pradaxa. I doubt underlying fracture but pain is likely hindering mobility even more.   Final Clinical Impression(s) / ED Diagnoses Final diagnoses:  Scrotal swelling  Peripheral edema    Rx / DC Orders ED Discharge Orders    None       Raeford Razor, MD 05/16/20 [phone]

## 2020-05-16 ENCOUNTER — Encounter (HOSPITAL_COMMUNITY): Payer: Self-pay | Admitting: Internal Medicine

## 2020-05-16 DIAGNOSIS — E118 Type 2 diabetes mellitus with unspecified complications: Secondary | ICD-10-CM

## 2020-05-16 DIAGNOSIS — D693 Immune thrombocytopenic purpura: Secondary | ICD-10-CM

## 2020-05-16 DIAGNOSIS — I480 Paroxysmal atrial fibrillation: Secondary | ICD-10-CM | POA: Diagnosis present

## 2020-05-16 DIAGNOSIS — Z794 Long term (current) use of insulin: Secondary | ICD-10-CM

## 2020-05-16 DIAGNOSIS — R600 Localized edema: Secondary | ICD-10-CM | POA: Diagnosis not present

## 2020-05-16 DIAGNOSIS — F329 Major depressive disorder, single episode, unspecified: Secondary | ICD-10-CM | POA: Diagnosis present

## 2020-05-16 DIAGNOSIS — N5089 Other specified disorders of the male genital organs: Secondary | ICD-10-CM

## 2020-05-16 DIAGNOSIS — N183 Chronic kidney disease, stage 3 unspecified: Secondary | ICD-10-CM | POA: Diagnosis not present

## 2020-05-16 DIAGNOSIS — I429 Cardiomyopathy, unspecified: Secondary | ICD-10-CM | POA: Diagnosis present

## 2020-05-16 DIAGNOSIS — Z7901 Long term (current) use of anticoagulants: Secondary | ICD-10-CM | POA: Diagnosis not present

## 2020-05-16 DIAGNOSIS — Z888 Allergy status to other drugs, medicaments and biological substances status: Secondary | ICD-10-CM | POA: Diagnosis not present

## 2020-05-16 DIAGNOSIS — I5023 Acute on chronic systolic (congestive) heart failure: Secondary | ICD-10-CM

## 2020-05-16 DIAGNOSIS — E782 Mixed hyperlipidemia: Secondary | ICD-10-CM

## 2020-05-16 DIAGNOSIS — I130 Hypertensive heart and chronic kidney disease with heart failure and stage 1 through stage 4 chronic kidney disease, or unspecified chronic kidney disease: Secondary | ICD-10-CM | POA: Diagnosis present

## 2020-05-16 DIAGNOSIS — Z20822 Contact with and (suspected) exposure to covid-19: Secondary | ICD-10-CM | POA: Diagnosis present

## 2020-05-16 DIAGNOSIS — H9190 Unspecified hearing loss, unspecified ear: Secondary | ICD-10-CM | POA: Diagnosis present

## 2020-05-16 DIAGNOSIS — Z8673 Personal history of transient ischemic attack (TIA), and cerebral infarction without residual deficits: Secondary | ICD-10-CM | POA: Diagnosis not present

## 2020-05-16 DIAGNOSIS — N400 Enlarged prostate without lower urinary tract symptoms: Secondary | ICD-10-CM

## 2020-05-16 DIAGNOSIS — I509 Heart failure, unspecified: Secondary | ICD-10-CM

## 2020-05-16 DIAGNOSIS — I083 Combined rheumatic disorders of mitral, aortic and tricuspid valves: Secondary | ICD-10-CM | POA: Diagnosis present

## 2020-05-16 DIAGNOSIS — Z7984 Long term (current) use of oral hypoglycemic drugs: Secondary | ICD-10-CM | POA: Diagnosis not present

## 2020-05-16 DIAGNOSIS — Z79899 Other long term (current) drug therapy: Secondary | ICD-10-CM | POA: Diagnosis not present

## 2020-05-16 DIAGNOSIS — N179 Acute kidney failure, unspecified: Secondary | ICD-10-CM | POA: Diagnosis present

## 2020-05-16 DIAGNOSIS — E1122 Type 2 diabetes mellitus with diabetic chronic kidney disease: Secondary | ICD-10-CM | POA: Diagnosis present

## 2020-05-16 DIAGNOSIS — N1832 Chronic kidney disease, stage 3b: Secondary | ICD-10-CM | POA: Diagnosis present

## 2020-05-16 DIAGNOSIS — I5082 Biventricular heart failure: Secondary | ICD-10-CM | POA: Diagnosis present

## 2020-05-16 DIAGNOSIS — I10 Essential (primary) hypertension: Secondary | ICD-10-CM

## 2020-05-16 DIAGNOSIS — Z66 Do not resuscitate: Secondary | ICD-10-CM | POA: Diagnosis present

## 2020-05-16 DIAGNOSIS — I4811 Longstanding persistent atrial fibrillation: Secondary | ICD-10-CM | POA: Diagnosis not present

## 2020-05-16 DIAGNOSIS — E785 Hyperlipidemia, unspecified: Secondary | ICD-10-CM | POA: Diagnosis present

## 2020-05-16 DIAGNOSIS — Z87891 Personal history of nicotine dependence: Secondary | ICD-10-CM | POA: Diagnosis not present

## 2020-05-16 DIAGNOSIS — Z950 Presence of cardiac pacemaker: Secondary | ICD-10-CM | POA: Diagnosis not present

## 2020-05-16 DIAGNOSIS — D631 Anemia in chronic kidney disease: Secondary | ICD-10-CM | POA: Diagnosis present

## 2020-05-16 DIAGNOSIS — R001 Bradycardia, unspecified: Secondary | ICD-10-CM | POA: Diagnosis present

## 2020-05-16 DIAGNOSIS — E1159 Type 2 diabetes mellitus with other circulatory complications: Secondary | ICD-10-CM

## 2020-05-16 HISTORY — DX: Acute on chronic systolic (congestive) heart failure: I50.23

## 2020-05-16 LAB — COMPREHENSIVE METABOLIC PANEL
ALT: 20 U/L (ref 0–44)
AST: 30 U/L (ref 15–41)
Albumin: 3.1 g/dL — ABNORMAL LOW (ref 3.5–5.0)
Alkaline Phosphatase: 119 U/L (ref 38–126)
Anion gap: 14 (ref 5–15)
BUN: 44 mg/dL — ABNORMAL HIGH (ref 8–23)
CO2: 26 mmol/L (ref 22–32)
Calcium: 8.9 mg/dL (ref 8.9–10.3)
Chloride: 94 mmol/L — ABNORMAL LOW (ref 98–111)
Creatinine, Ser: 1.52 mg/dL — ABNORMAL HIGH (ref 0.61–1.24)
GFR calc Af Amer: 46 mL/min — ABNORMAL LOW (ref >60)
GFR calc non Af Amer: 40 mL/min — ABNORMAL LOW (ref >60)
Glucose, Bld: 125 mg/dL — ABNORMAL HIGH (ref 70–99)
Potassium: 3.9 mmol/L (ref 3.5–5.1)
Sodium: 134 mmol/L — ABNORMAL LOW (ref 135–145)
Total Bilirubin: 1.5 mg/dL — ABNORMAL HIGH (ref 0.3–1.2)
Total Protein: 7.5 g/dL (ref 6.5–8.1)

## 2020-05-16 LAB — GLUCOSE, CAPILLARY
Glucose-Capillary: 142 mg/dL — ABNORMAL HIGH (ref 70–99)
Glucose-Capillary: 132 mg/dL — ABNORMAL HIGH (ref 70–99)
Glucose-Capillary: 137 mg/dL — ABNORMAL HIGH (ref 70–99)
Glucose-Capillary: 89 mg/dL (ref 70–99)

## 2020-05-16 MED ORDER — POLYETHYLENE GLYCOL 3350 17 G PO PACK
17.0000 g | PACK | Freq: Every day | ORAL | Status: DC
Start: 2020-05-17 — End: 2020-05-16

## 2020-05-16 MED ORDER — VENLAFAXINE HCL ER 37.5 MG PO CP24: 37.5000 mg | ORAL_CAPSULE | Freq: Every day | ORAL | Status: DC

## 2020-05-16 MED ORDER — FUROSEMIDE 10 MG/ML IJ SOLN
60.0000 mg | Freq: Two times a day (BID) | INTRAMUSCULAR | Status: DC
  Administered 2020-05-16 – 2020-05-18 (×4): 60 mg via INTRAVENOUS
  Filled 2020-05-16 (×4): qty 6

## 2020-05-16 MED ORDER — POLYETHYLENE GLYCOL 3350 17 G PO PACK
17.0000 g | PACK | Freq: Every day | ORAL | Status: DC
  Administered 2020-05-17 – 2020-05-19 (×3): 17 g via ORAL
  Filled 2020-05-16 (×4): qty 1

## 2020-05-16 NOTE — Care Management Obs Status (Signed)
MEDICARE OBSERVATION STATUS NOTIFICATION   Patient Details  Name: Ross Morgan MRN: [id number] Date of Birth: 24-Sep-1928   Medicare Observation Status Notification Given:  Hart Robinsons, LCSW 05/16/2020, 9:37 AM

## 2020-05-16 NOTE — Care Management CC44 (Signed)
Condition Code 44 Documentation Completed  Patient Details  Name: Ross Morgan MRN: [id number] Date of Birth: 06/14/1928   Condition Code 44 given:  Yes Patient signature on Condition Code 44 notice:  Yes Documentation of 2 MD's agreement:  Yes Code 44 added to claim:  Yes    Nohelani Benning, LCSW 05/16/2020, 9:37 AM

## 2020-05-16 NOTE — Evaluation (Signed)
Occupational Therapy Evaluation Patient Details Name: Ross Morgan MRN: [id number] DOB: Oct 14, 1928 Today's Date: 05/16/2020    History of Present Illness 86 year old male with past medical history for hypertension, dyslipidemia, type 2 diabetes mellitus, atrial fibrillation, history of CVA, systolic congestive heart failure, chronic kidney disease stage IIIb and BPH who presents with worsening peripheral edema.  As an outpatient he was changed from furosemide to torsemide and Zaroxolyn, but he had worsening renal function and diuretic therapy was held.  Over the last several days his edema has been severe, positive scrotal edema and he is not longer able to speak or stay comfortable.   Clinical Impression   Pt admitted with the above. Pt currently with functional limitations due to the deficits listed below (see OT Problem List).  Pt will benefit from skilled OT to increase their safety and independence with ADL and functional mobility for ADL to facilitate discharge to venue listed below.   Pt did have fall in hospital 7/5.  Pt very uncomfortable in supine- more comfortable in sitting and standing. RN aware     Follow Up Recommendations  Home health OT;Supervision/Assistance - 24 hour (will need 24/7 A.  Will arrange with [facility])    Equipment Recommendations  None recommended by OT    Recommendations for Other Services       Precautions / Restrictions Precautions Precautions: Fall Precaution Comments: pain in R leg      Mobility Bed Mobility Overal bed mobility: Needs Assistance Bed Mobility: Supine to Sit     Supine to sit: Min assist        Transfers Overall transfer level: Needs assistance Equipment used: Rolling walker (2 wheeled) Transfers: Sit to/from Stand Sit to Stand: Mod assist              Balance Overall balance assessment: Needs assistance Sitting-balance support: Bilateral upper extremity supported Sitting balance-Leahy Scale: Poor                                      ADL either performed or assessed with clinical judgement   ADL Overall ADL's : Needs assistance/impaired Eating/Feeding: Set up;Sitting   Grooming: Wash/dry hands;Sitting   Upper Body Bathing: Set up;Sitting   Lower Body Bathing: Sit to/from stand;Cueing for safety;Cueing for sequencing;Maximal assistance   Upper Body Dressing : Minimal assistance;Sitting       Toilet Transfer: Minimal assistance;Cueing for sequencing;Cueing for safety;BSC;Moderate assistance   Toileting- Clothing Manipulation and Hygiene: Moderate assistance;Sit to/from stand;Cueing for safety;Cueing for sequencing       Functional mobility during ADLs: Moderate assistance General ADL Comments: Pt with pain RLE.  RN brought pain meds. Pt more comfortable in standing . Daughter present. RN aware of pt more comfortable standing and sitting.     Vision Patient Visual Report: No change from baseline       Perception     Praxis      Pertinent Vitals/Pain Pain Assessment: Faces Pain Score: 10-Worst pain ever Pain Location: RLE Pain Descriptors / Indicators: Grimacing;Discomfort Pain Intervention(s): Limited activity within patient's tolerance;Repositioned;Patient requesting pain meds-RN notified;Monitored during session     Hand Dominance     Extremity/Trunk Assessment Upper Extremity Assessment Upper Extremity Assessment: Generalized weakness           Communication Communication Communication: HOH   Cognition Arousal/Alertness: Awake/alert Behavior During Therapy: WFL for tasks assessed/performed Overall Cognitive Status: Within Functional Limits for tasks assessed  Home Living Family/patient expects to be discharged to:: Private residence Living Arrangements: Alone   Type of Home: House Home Access: Level entry     Home Layout: One level     Bathroom Shower/Tub: Retail banker: Handicapped height         Additional Comments: pt lives at [employer] and daugther is calling  to arrange 24/7 A      Prior Functioning/Environment Level of Independence: Independent                 OT Problem List: Decreased strength;Impaired balance (sitting and/or standing);Decreased activity tolerance;Decreased safety awareness;Decreased knowledge of use of DME or AE      OT Treatment/Interventions: Self-care/ADL training;Patient/family education;Therapeutic activities;DME and/or AE instruction    OT Goals(Current goals can be found in the care plan section) Acute Rehab OT Goals Patient Stated Goal: back to [facility] OT Goal Formulation: With patient Time For Goal Achievement: 05/23/20 Potential to Achieve Goals: Good  OT Frequency: Min 2X/week    AM-PAC OT "6 Clicks" Daily Activity     Outcome Measure Help from another person eating meals?: A Little Help from another person taking care of personal grooming?: A Little Help from another person toileting, which includes using toliet, bedpan, or urinal?: A Lot Help from another person bathing (including washing, rinsing, drying)?: A Lot Help from another person to put on and taking off regular upper body clothing?: A Little Help from another person to put on and taking off regular lower body clothing?: A Lot 6 Click Score: 15   End of Session Equipment Utilized During Treatment: Rolling walker Nurse Communication: Mobility status  Activity Tolerance: Patient tolerated treatment well Patient left: in bed;Other (comment) (sitting EOB)  OT Visit Diagnosis: Unsteadiness on feet (R26.81);Other abnormalities of gait and mobility (R26.89);Muscle weakness (generalized) (M62.81)                Time: 1515-1600 OT Time Calculation (min): 45 min Charges:  OT General Charges $OT Visit: 1 Visit OT Evaluation $OT Eval Moderate Complexity: 1 Mod OT Treatments $Self Care/Home Management : 8-22  mins  Lise Auer, OT Acute Rehabilitation Services [phone] Office- [phone]     Taleigha Pinson, Karin Golden D 05/16/2020, 4:21 PM

## 2020-05-16 NOTE — Progress Notes (Signed)
PROGRESS NOTE    Ross Morgan  EYC:[id number] DOB: 1928/10/30 DOA: 05/15/2020 PCP: [name], MD    Brief Narrative:  Patient was admitted to the hospital with a working diagnosis of acute decompensation of chronic systolic heart failure.   86 year old male with past medical history for hypertension, dyslipidemia, type 2 diabetes mellitus, atrial fibrillation, history of CVA, systolic congestive heart failure, chronic kidney disease stage IIIb and BPH who presents with worsening peripheral edema.  As an outpatient he was changed from furosemide to torsemide and Zaroxolyn, but he had worsening renal function and diuretic therapy was held.  Over the last several days his edema has been severe, positive scrotal edema and he is not longer able to speak or stay comfortable.  On his initial physical examination blood pressure 132/67, heart rate 64, respiratory rate 18, temperature 97.6, oxygen saturation 96%.  His lungs are clear to auscultation bilaterally, heart S1-S2, present, rhythmic, abdomen soft, +3+ lower extremity edema up to his thighs lower abdomen.  Positive scrotal edema. Sodium 135, potassium 4.0, chloride 97, bicarb 26, glucose 129, BUN 43, creatinine 1.70, white count 5.9, hemoglobin 9.2, hematocrit 49.4, platelets 116.  SARS COVID-19 negative.  Urine analysis negative for infection.  His EKG was 62 bpm, left axis deviation, left bundle branch block, a sensed, V paced, V5-V6 ST depression T wave inversion (LBBB).    Assessment & Plan:   Principal Problem:   Acute on chronic systolic CHF (congestive heart failure) (HCC) Active Problems:   Essential hypertension   Hyperlipidemia   Bradycardia, sinus   Atrial fibrillation, slow VR and pauses   Type II diabetes mellitus (HCC)   Stroke (cerebrum) (HCC)   BPH (benign prostatic hyperplasia)   Diabetes mellitus with complication (HCC)   Bilateral lower extremity edema   Chronic ITP (idiopathic thrombocytopenia) (HCC)   Chronic  anticoagulation   HOH (hard of hearing)   CRI (chronic renal insufficiency), stage 3 (moderate)   Scrotal edema   1. Acute on chronic systolic heart failure exacerbation. Patient continue to be very edematous, positive hypervolemia. His urine output over last 24 H has been 3,125 ml. Systolic blood pressure 144 mmHg.   Old records personally reviewed echocardiogram from 01/2020 with worsening LV systolic function EF 40%, with global hypokinesis, reduced RV systolic function, moderate pulmonary HTN, with moderate mitral regurgitation and mild aortic stenosis.   Patient has predominantly right heart failure symptoms, will continue with aggressive diuresis with IV furosemide 60 mg IV q12 H, close follow up on urine output and hemodynamics. Hold on metolazone for now.   Will add telemetry monitoring.   2. AKI on CKD stage 3b/ hypokalemia. Renal function with serum cr at 1,52 with K at 3,9 and serum bicarbonate at 26. Urine output 3,125 ml.   Will continue diuresis with loop diuretic and follow up on renal function in am, check Mg. Continue K correction target K at 4 and Mg at 2.   3. HTN. Continue blood pressure control with carvedilol, will hold on irbesartan for now, while patient under aggressive diuresis.  4. T2DM / dyslipidemia. Her glucose has been well controlled, fasting glucose 125, continue with insulin sliding scale for glucose cover and monitoring. Patient is tolerating po well.   Continue with atorvastatin.   5. Atrial fibrillation. Patient on a sensed and V paced rhythm, will continue anticoagulation with dabigatran and rate control with carvedilol.   6. Hx of CVA. Continue anticoagulation, will consult PT and OT.   7. Chronic anemia and  ITP (sp IVG in 04/21 and 05/21), hgb stable at 9,2 and Hct at 29,4. Plt 116.   Will continue close follow up of cell count.   8. BPH. No signs of urinary retention. Continue with tamsulosin,    Patient continue to be at high risk for  worsening heart failure and renal failure/ AKI.   Status is: Observation  The patient will require care spanning > 2 midnights and should be moved to inpatient because: IV treatments appropriate due to intensity of illness or inability to take PO  Dispo: The patient is from: Home              Anticipated d/c is to: Home              Anticipated d/c date is: 3 days              Patient currently is not medically stable to d/c.   DVT prophylaxis: dabigatran   Code Status:   full  Family Communication:  No family at the bedside      Subjective: Patient continue to have significant edema, scrotum and lower extremities, not back to his baseline, very weak and deconditioned   Objective: Vitals:   05/15/20 2000 05/15/20 2138 05/16/20 0500 05/16/20 0519  BP: 120/60 (!) 133/55  (!) 141/57  Pulse: 60 60  68  Resp: 16 (!) 23  20  Temp: 98.1 F (36.7 C) 98.1 F (36.7 C)    TempSrc: Oral Oral  Oral  SpO2: 100% 92%  97%  Weight:   79.5 kg   Height:        Intake/Output Summary (Last 24 hours) at 05/16/2020 1338 Last data filed at 05/16/2020 1238 Gross per 24 hour  Intake 820 ml  Output 4025 ml  Net -3205 ml   Filed Weights   05/15/20 0635 05/16/20 0500  Weight: 76.2 kg 79.5 kg    Examination:   General: deconditioned  Neurology: Awake and alert, non focal  E ENT: no pallor, no icterus, oral mucosa moist Cardiovascular: positive JVD. S1-S2 present, rhythmic, no gallops, or rubs, positive systolic murmur left sternal border 3/6 . +++ pitting bilatera lower extremity edema. Severe pitting scrotal edema,  Pulmonary: positive breath sounds bilaterally, no wheezing, rhonchi or rales. Gastrointestinal. Abdomen with, no organomegaly, non tender, no rebound or guarding Skin. No rashes Musculoskeletal: no joint deformities     Data Reviewed: I have personally reviewed following labs and imaging studies  CBC: Recent Labs  Lab 05/15/20 0652  WBC 5.9  NEUTROABS 4.0  HGB 9.2*    HCT 29.4*  MCV 103.2*  PLT 116*   Basic Metabolic Panel: Recent Labs  Lab 05/15/20 0652 05/16/20 0316  NA 135 134*  K 4.0 3.9  CL 97* 94*  CO2 26 26  GLUCOSE 129* 125*  BUN 43* 44*  CREATININE 1.70* 1.52*  CALCIUM 8.9 8.9   GFR: Estimated Creatinine Clearance: 34 mL/min (A) (by C-G formula based on SCr of 1.52 mg/dL (H)). Liver Function Tests: Recent Labs  Lab 05/16/20 0316  AST 30  ALT 20  ALKPHOS 119  BILITOT 1.5*  PROT 7.5  ALBUMIN 3.1*   No results for input(s): LIPASE, AMYLASE in the last 168 hours. No results for input(s): AMMONIA in the last 168 hours. Coagulation Profile: No results for input(s): INR, PROTIME in the last 168 hours. Cardiac Enzymes: No results for input(s): CKTOTAL, CKMB, CKMBINDEX, TROPONINI in the last 168 hours. BNP (last 3 results) No results for input(s):  PROBNP in the last 8760 hours. HbA1C: Recent Labs    05/15/20 0652  HGBA1C 7.4*   CBG: Recent Labs  Lab 05/15/20 1224 05/15/20 1703 05/15/20 2141 05/16/20 0746 05/16/20 1215  GLUCAP 120* 128* 175* 142* 132*   Lipid Profile: No results for input(s): CHOL, HDL, LDLCALC, TRIG, CHOLHDL, LDLDIRECT in the last 72 hours. Thyroid Function Tests: Recent Labs    05/15/20 1142  TSH 1.363   Anemia Panel: No results for input(s): VITAMINB12, FOLATE, FERRITIN, TIBC, IRON, RETICCTPCT in the last 72 hours.    Radiology Studies: I have reviewed all of the imaging during this hospital visit personally     Scheduled Meds: . atorvastatin  20 mg Oral QHS  . carvedilol  3.125 mg Oral BID WC  . citalopram  20 mg Oral QHS  . dabigatran  150 mg Oral BID  . furosemide  60 mg Intravenous Q6H  . insulin aspart  0-15 Units Subcutaneous TID WC  . irbesartan  300 mg Oral Daily  . linagliptin  5 mg Oral BID WC  . metolazone  2.5 mg Oral Daily  . potassium chloride SA  20 mEq Oral Daily  . sodium chloride flush  3 mL Intravenous Q12H  . tamsulosin  0.4 mg Oral Daily   Continuous  Infusions: . sodium chloride       LOS: 1 day        Rebeckah Masih Annett Gula, MD

## 2020-05-17 DIAGNOSIS — H9190 Unspecified hearing loss, unspecified ear: Secondary | ICD-10-CM

## 2020-05-17 DIAGNOSIS — Z7901 Long term (current) use of anticoagulants: Secondary | ICD-10-CM

## 2020-05-17 DIAGNOSIS — R001 Bradycardia, unspecified: Secondary | ICD-10-CM

## 2020-05-17 DIAGNOSIS — I50813 Acute on chronic right heart failure: Secondary | ICD-10-CM

## 2020-05-17 LAB — BASIC METABOLIC PANEL
Anion gap: 16 — ABNORMAL HIGH (ref 5–15)
BUN: 38 mg/dL — ABNORMAL HIGH (ref 8–23)
CO2: 26 mmol/L (ref 22–32)
Calcium: 9 mg/dL (ref 8.9–10.3)
Chloride: 91 mmol/L — ABNORMAL LOW (ref 98–111)
Creatinine, Ser: 1.48 mg/dL — ABNORMAL HIGH (ref 0.61–1.24)
GFR calc Af Amer: 48 mL/min — ABNORMAL LOW (ref >60)
GFR calc non Af Amer: 41 mL/min — ABNORMAL LOW (ref >60)
Glucose, Bld: 136 mg/dL — ABNORMAL HIGH (ref 70–99)
Potassium: 3.6 mmol/L (ref 3.5–5.1)
Sodium: 133 mmol/L — ABNORMAL LOW (ref 135–145)

## 2020-05-17 LAB — GLUCOSE, CAPILLARY
Glucose-Capillary: 120 mg/dL — ABNORMAL HIGH (ref 70–99)
Glucose-Capillary: 126 mg/dL — ABNORMAL HIGH (ref 70–99)
Glucose-Capillary: 146 mg/dL — ABNORMAL HIGH (ref 70–99)
Glucose-Capillary: 198 mg/dL — ABNORMAL HIGH (ref 70–99)

## 2020-05-17 MED ORDER — VENLAFAXINE HCL ER 37.5 MG PO CP24
37.5000 mg | ORAL_CAPSULE | Freq: Every day | ORAL | Status: DC
  Administered 2020-05-17 – 2020-05-19 (×3): 37.5 mg via ORAL
  Filled 2020-05-17 (×3): qty 1

## 2020-05-17 MED ORDER — POTASSIUM CHLORIDE CRYS ER 20 MEQ PO TBCR
40.0000 meq | EXTENDED_RELEASE_TABLET | Freq: Once | ORAL | Status: AC
  Administered 2020-05-17: 40 meq via ORAL
  Filled 2020-05-17: qty 2

## 2020-05-17 MED ORDER — OXYCODONE-ACETAMINOPHEN 5-325 MG PO TABS
1.0000 | ORAL_TABLET | ORAL | Status: DC | PRN
  Administered 2020-05-17 – 2020-05-19 (×5): 1 via ORAL
  Filled 2020-05-17 (×5): qty 1

## 2020-05-17 MED ORDER — LORAZEPAM 2 MG/ML IJ SOLN
0.5000 mg | Freq: Once | INTRAMUSCULAR | Status: AC
  Administered 2020-05-17: 0.5 mg via INTRAVENOUS
  Filled 2020-05-17: qty 1

## 2020-05-17 MED ORDER — OXYCODONE HCL 5 MG PO TABS
2.5000 mg | ORAL_TABLET | ORAL | Status: DC | PRN
  Administered 2020-05-17: 5 mg via ORAL
  Filled 2020-05-17 (×2): qty 1

## 2020-05-17 NOTE — Progress Notes (Signed)
PROGRESS NOTE  Ross Morgan  SLH:[id number] DOB: Aug 14, 1928 DOA: 05/15/2020 PCP: Ross Clan, MD  Outpatient Specialists: Cardiology, Dr. Allyson Morgan Brief Narrative: Ross Morgan is an 86 y.o. male with a history of chronic HFrEF, stage IIIb CKD, AFib, T2DM, HTN, HLD, CVA, ITP and BPH who presented from [facility] with progressively worsening leg and scrotal edema despite changing furosemide to torsemide and metolazone which caused AKI. On presentation he was grossly volume overloaded with clear lungs. Lasix has been given with improved symptoms, though he remains overloaded. Cardiology is consulted.  Assessment & Plan: Principal Problem:   Acute on chronic systolic CHF (congestive heart failure) (HCC) Active Problems:   Essential hypertension   Hyperlipidemia   Bradycardia, sinus   Atrial fibrillation, slow VR and pauses   Type II diabetes mellitus (HCC)   Stroke (cerebrum) (HCC)   BPH (benign prostatic hyperplasia)   Diabetes mellitus with complication (HCC)   Bilateral lower extremity edema   Chronic ITP (idiopathic thrombocytopenia) (HCC)   Chronic anticoagulation   HOH (hard of hearing)   CRI (chronic renal insufficiency), stage 3 (moderate)   Scrotal edema   Heart failure (HCC)  Acute on chronic combined and biventricular CHF with valvular heart disease: Predominantly right-sided symptoms with clear pulmonary exam. Echo March 2021 w/LVEF 40%, global hypokinesis, indeterminate diastolic function, RV function reduced with elevated PASP, severe biatrial enlargement, mild-mod MR, severe TR, mild AS and mild-mod AR.  - Appreciate cardiology consultation. Diuresis thus far has been effective with improvement in renal congestion. Will monitor I/O, daily weights, and BMP. Continue lasix 60mg  IV BID (4.4L UOP/24hrs, weight down 175lbs > 157lbd, unclear EDW). Not requiring metolazone.  - Guideline directed therapy including beta blocker is continued. ARB being held with renal impairment and  need for diuresis.   AKI on stage IIIb CKD:  - Monitor renal function closely with aggressive diuresis. Anticipate continued improvement as renovascular congestion is relieved.  - Avoid NSAIDs  Paroxysmal atrial fibrillation, history of symptomatic bradycardia s/p PPM 2015 [employer] DDD permanent pacemaker (serial [id number]) inserted:  - Continue coreg and dabigatran  T2DM:  - Continue SSI  Anemia of chronic disease and ITP: - Stable blood counts, will continue monitoring.    HLD:  - continue statin  HTN:  - Continue BP medications as above.   History of CVA: Eliquis changed to pradaxa following TIA admission. - Continue anticoagulation and statin  BPH: No evidence of urinary retention at this time.  - Continue flomax, condom catheter.  DVT prophylaxis: Dabigatran Code Status: DNR Family Communication: Daughter at bedside Disposition Plan:  Status is: Inpatient  Remains inpatient appropriate because:Hemodynamically unstable, Persistent severe electrolyte disturbances and Inpatient level of care appropriate due to severity of illness  Dispo: The patient is from: [facility]              Anticipated d/c is to: [facility] w/maximal HH              Anticipated d/c date is: 2 days              Patient currently is not medically stable to d/c.  Consultants:   Cardiology, Dr. 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 Has intermittent pain in the left leg which has been present for a while, no new pain or pain in lower legs. Denies shortness of  breath and current or recent chest pain.   Objective: Vitals:   05/17/20 0519 05/17/20 0822 05/17/20 1005 05/17/20 1112  BP: (!) 111/50 (!) 129/46 (!) 101/43 (!) 110/54  Pulse: (!) 57 74 64 60  Resp: 20 19 19    Temp: 98.1 F (36.7  C) 98.1 F (36.7 C) 98.3 F (36.8 C)   TempSrc: Oral Oral Axillary   SpO2: 93% 99% 98%   Weight:      Height:        Intake/Output Summary (Last 24 hours) at 05/17/2020 1455 Last data filed at 05/17/2020 1300 Gross per 24 hour  Intake 837 ml  Output 3125 ml  Net -2288 ml   Filed Weights   05/15/20 0635 05/16/20 0500 05/17/20 0500  Weight: 76.2 kg 79.5 kg 71.5 kg    Gen: Elderly in no distress, not interested in conversation. Pulm: Non-labored breathing room air. Clear to auscultation bilaterally.  CV: Regular paced. II/VI systolic murmur at left sternal border without rub, or gallop. No significant JVD, 2+ pitting dependent edema in legs involving scrotum severely.  GI: Abdomen soft, non-tender, non-distended, with normoactive bowel sounds. No organomegaly or masses felt. Ext: Warm, no deformities Skin: No rashes, lesions or ulcers Neuro: Alert and oriented. He refuses hearing aids but can hear loud voice with right ear. At his mental baseline per daughter.No focal neurological deficits. Psych: Judgement and insight appear normal. Mood & affect appropriate.   Data Reviewed: I have personally reviewed following labs and imaging studies  CBC: Recent Labs  Lab 05/15/20 0652  WBC 5.9  NEUTROABS 4.0  HGB 9.2*  HCT 29.4*  MCV 103.2*  PLT 116*   Basic Metabolic Panel: Recent Labs  Lab 05/15/20 0652 05/16/20 0316 05/17/20 0420  NA 135 134* 133*  K 4.0 3.9 3.6  CL 97* 94* 91*  CO2 26 26 26   GLUCOSE 129* 125* 136*  BUN 43* 44* 38*  CREATININE 1.70* 1.52* 1.48*  CALCIUM 8.9 8.9 9.0   GFR: Estimated Creatinine Clearance: 34.2 mL/min (A) (by C-G formula based on SCr of 1.48 mg/dL (H)). Liver Function Tests: Recent Labs  Lab 05/16/20 0316  AST 30  ALT 20  ALKPHOS 119  BILITOT 1.5*  PROT 7.5  ALBUMIN 3.1*   No results for input(s): LIPASE, AMYLASE in the last 168 hours. No results for input(s): AMMONIA in the last 168 hours. Coagulation Profile: No results for  input(s): INR, PROTIME in the last 168 hours. Cardiac Enzymes: No results for input(s): CKTOTAL, CKMB, CKMBINDEX, TROPONINI in the last 168 hours. BNP (last 3 results) No results for input(s): PROBNP in the last 8760 hours. HbA1C: Recent Labs    05/15/20 0652  HGBA1C 7.4*   CBG: Recent Labs  Lab 05/16/20 1215 05/16/20 1657 05/16/20 1947 05/17/20 0818 05/17/20 1109  GLUCAP 132* 137* 89 120* 126*   Lipid Profile: No results for input(s): CHOL, HDL, LDLCALC, TRIG, CHOLHDL, LDLDIRECT in the last 72 hours. Thyroid Function Tests: Recent Labs    05/15/20 1142  TSH 1.363   Anemia Panel: No results for input(s): VITAMINB12, FOLATE, FERRITIN, TIBC, IRON, RETICCTPCT in the last 72 hours. Urine analysis:    Component Value Date/Time   COLORURINE YELLOW 05/15/2020 1019   APPEARANCEUR CLEAR 05/15/2020 1019   LABSPEC 1.008 05/15/2020 1019   PHURINE 6.0 05/15/2020 1019   GLUCOSEU NEGATIVE 05/15/2020 1019   HGBUR NEGATIVE 05/15/2020 1019   BILIRUBINUR NEGATIVE 05/15/2020 1019   KETONESUR NEGATIVE 05/15/2020 1019   PROTEINUR NEGATIVE 05/15/2020 1019  UROBILINOGEN 0.2 04/22/2008 1535   NITRITE NEGATIVE 05/15/2020 1019   LEUKOCYTESUR NEGATIVE 05/15/2020 1019   Recent Results (from the past 240 hour(s))  SARS Coronavirus 2 by RT PCR (hospital order, performed in [facility] hospital lab) Nasopharyngeal Nasopharyngeal Swab     Status: None   Collection Time: 05/15/20  7:47 AM   Specimen: Nasopharyngeal Swab  Result Value Ref Range Status   SARS Coronavirus 2 NEGATIVE NEGATIVE Final    Comment: (NOTE) SARS-CoV-2 target nucleic acids are NOT DETECTED.  The SARS-CoV-2 RNA is generally detectable in upper and lower respiratory specimens during the acute phase of infection. The lowest concentration of SARS-CoV-2 viral copies this assay can detect is 250 copies / mL. A negative result does not preclude SARS-CoV-2 infection and should not be used as the sole basis for treatment or  other patient management decisions.  A negative result may occur with improper specimen collection / handling, submission of specimen other than nasopharyngeal swab, presence of viral mutation(s) within the areas targeted by this assay, and inadequate number of viral copies (<250 copies / mL). A negative result must be combined with clinical observations, patient history, and epidemiological information.  Fact Sheet for Patients:   BoilerBrush.com.cy  Fact Sheet for Healthcare Providers: https://pope.com/  This test is not yet approved or  cleared by the [place] FDA and has been authorized for detection and/or diagnosis of SARS-CoV-2 by FDA under an Emergency Use Authorization (EUA).  This EUA will remain in effect (meaning this test can be used) for the duration of the COVID-19 declaration under Section 564(b)(1) of the Act, 21 U.S.C. section 360bbb-3(b)(1), unless the authorization is terminated or revoked sooner.  Performed at [facility], [address] [place], [place] [address]       Radiology Studies: No results found.  Scheduled Meds:  atorvastatin  20 mg Oral QHS   carvedilol  3.125 mg Oral BID WC   dabigatran  150 mg Oral BID   furosemide  60 mg Intravenous Q12H   insulin aspart  0-15 Units Subcutaneous TID WC   polyethylene glycol  17 g Oral Daily   potassium chloride  40 mEq Oral Once   sodium chloride flush  3 mL Intravenous Q12H   tamsulosin  0.4 mg Oral Daily   venlafaxine XR  37.5 mg Oral Q breakfast   Continuous Infusions:  sodium chloride       LOS: 2 days   Time spent: 25 minutes.  Tyrone Nine, MD [facility] [email or web address] 05/17/2020, 2:55 PM

## 2020-05-17 NOTE — Consult Note (Addendum)
Cardiology Consultation:   Patient ID: Ross Morgan; [id number]; 03/25/1928   Admit date: 05/15/2020 Date of Consult: 05/17/2020  Primary Care Provider: [name], MD Primary Cardiologist: Nanetta Batty, MD Primary Electrophysiologist:  None   Patient Profile:   Ross Morgan is a 86 y.o. male with a PMH of chronic combined CHF, paroxysmal atrial fibrillation on pradaxa, symptomatic bradycardia s/p PPM, moderate mitral regurgitation, HTN, HLD, DM type 2, CKD stage 3, and TIA, who is being seen today for the evaluation of acute on chronic CHF at the request of Dr. Jarvis Newcomer.  History of Present Illness:   Mr. Lemmerman presented to [facility] ED with complaints of severe scrotal edema making it uncomfortable to sit. He reports this started ~1 week ago and progressively worsened prompting him to present to the ED for further evaluation.   He was last evaluated by cardiology at an outpatient visit with Dr. Allyson Sabal 04/18/20, at which time he had stable weights with 2+ LLE and 1+ RLE edema on torsemide. No medication changes occurred and he was recommended to follow-up in 3 months. He has been on metolazone in the past, however this was discontinued due to worsening renal function. His last echo 01/2020 showed EF 40% (down from 45-50% in 2017), global hypokinesis with mild to moderate LV dilation, indeterminate LV diastolic function, mildly reduced RV systolic function, moderately elevated PA pressures, severe biatrial enlargement, mild-moderate MR, severe TR, mild AS, and mild to moderate AI. His last device interrogation 02/2020 showed normal function. Reportedly has a non-ischemic cardiomyopathy, however I do not see any ischemic evaluation in Epic.  At the time of this evaluation he reports improvement in his scrotal edema and is not able to sit comfortably. He lives in an independent living facility. He is responsible for preparing his own breakfast and lunch. Daughter reports he eats a sausage biscuit every  morning but this has not changed in recent weeks/months. He denies any significant change in dietary habits recently. He reports the swelling came on fairly quickly. Shoes wouldn't fit and he was unable to sit down comfortably due to scrotal swelling. No complaints of chest pain. He has some chronic mild DOE which is unchanged in recent weeks/months. No complaints of palpitations, orthopnea, PND, dizziness, lightheadedness, or syncope. He reports compliance with his medications.   Hospital course:  VSS. Labs notable for K 4.0>3.6, Cr. 1.6>1.48, Hgb 9.2, PLT 116, A1C 7.4, TSH wnl. EKG with demand pacing with underlying atrial fibrillation with rate 62 bpm, chronic anterolateral TWI; no significant change from previous. Patient was admitted to medicine and started on lasix 60mg  TID>BID. He received metolazone 2.5mg  x 2 doses as well. UOP is net -4.0L in the past 24 hours and -6.6L this admission. Weight is down to 157lbs from 175lbs yesterday. Cardiology asked to evaluate.   Past Medical History:  Diagnosis Date  . Acute on chronic systolic CHF (congestive heart failure) (HCC) 05/16/2020  . Anal fissure   . Arthritis    "left pointer" (05/30/2014)  . Atrial fibrillation (HCC)   . Bradycardia   . Depression   . Hyperlipidemia   . Hypertension   . Knee pain 2015   "tore tendon left knee; no OR"  . On continuous oral anticoagulation   . Stroke (HCC)   . Symptomatic bradycardia    status post dual AV permanent transvenous pacemaker insertion  . Type II diabetes mellitus (HCC)     Past Surgical History:  Procedure Laterality Date  . CATARACT EXTRACTION W/ INTRAOCULAR  LENS  IMPLANT, BILATERAL Bilateral 2000's  . PACEMAKER INSERTION  05/31/14   STJ dual chamber pacemaker implanted by Dr Ladona Ridgel for symptomatic bradycardia  . PERMANENT PACEMAKER INSERTION N/A 05/31/2014   Procedure: PERMANENT PACEMAKER INSERTION;  Surgeon: Marinus Maw, MD;  Location[facility] CATH LAB;  Service: Cardiovascular;   Laterality: N/A;     Home Medications:  Prior to Admission medications   Medication Sig Start Date End Date Taking? Authorizing Provider  atorvastatin (LIPITOR) 20 MG tablet Take 20 mg by mouth at bedtime. 06/22/15  Yes [provider]  B Complex-C (SUPER B COMPLEX/VITAMIN C PO) Take 1 tablet by mouth 2 (two) times daily.   Yes [provider]  Calcium Carbonate-Simethicone (ALKA-SELTZER HEARTBURN + GAS) 750-80 MG CHEW Chew 1-2 each by mouth at bedtime.   Yes [provider]  Carboxymethylcellulose Sodium (LUBRICANT EYE DROPS OP) Apply 1 drop to eye daily as needed (dry eye).    Yes [provider]  carvedilol (COREG) 3.125 MG tablet Take 3.125 mg by mouth in the morning and at bedtime. 01/08/20  Yes [provider]  Cholecalciferol (VITAMIN D3) 25 MCG (1000 UT) CAPS Take 1 capsule by mouth 2 (two) times daily.   Yes [provider]  Coenzyme Q10 (CO Q-10) 200 MG CAPS Take 1 capsule by mouth daily.    Yes [provider]  dabigatran (PRADAXA) 150 MG CAPS capsule Take 150 mg by mouth 2 (two) times daily.   Yes [provider]  Lidocaine, Anorectal, (RECTICARE EX) Apply 1 application topically daily as needed (rectal pain).   Yes [provider]  Multiple Vitamin (MULTIVITAMIN) capsule Take 1 capsule by mouth daily.   Yes [provider]  olmesartan (BENICAR) 40 MG tablet Take 40 mg by mouth daily.   Yes [provider]  potassium chloride SA (KLOR-CON) 20 MEQ tablet Take 20 mEq by mouth daily.   Yes [provider]  sitaGLIPtin-metformin (JANUMET) 50-1000 MG per tablet Take 1 tablet by mouth 2 (two) times daily with a meal.   Yes [provider]  tamsulosin (FLOMAX) 0.4 MG CAPS capsule Take 0.4 mg by mouth at bedtime.    Yes [provider]  torsemide (DEMADEX) 20 MG tablet Take 1 tablet (20 mg total) by mouth 2 (two) times daily. 03/16/20 06/14/20 Yes Kilroy, Luke K, PA-C    venlafaxine (EFFEXOR) 37.5 MG tablet Take 37.5 mg by mouth daily.    Yes [provider]    Inpatient Medications: Scheduled Meds: . atorvastatin  20 mg Oral QHS  . carvedilol  3.125 mg Oral BID WC  . dabigatran  150 mg Oral BID  . furosemide  60 mg Intravenous Q12H  . insulin aspart  0-15 Units Subcutaneous TID WC  . polyethylene glycol  17 g Oral Daily  . sodium chloride flush  3 mL Intravenous Q12H  . tamsulosin  0.4 mg Oral Daily  . venlafaxine XR  37.5 mg Oral Q breakfast   Continuous Infusions: . sodium chloride     PRN Meds: sodium chloride, acetaminophen, metoprolol tartrate, sodium chloride flush, traMADol  Allergies:    Allergies  Allergen Reactions  . Hctz [Hydrochlorothiazide] Other (See Comments)    Severe reaction  . Zetia [Ezetimibe] Other (See Comments)    LONG TIME AGO    Social History:   Social History   Socioeconomic History  . Marital status: Married    Spouse name: Not on file  . Number of children: 2  .  Years of education: Not on file  . Highest education level: Not on file  Occupational History  . Occupation: retired Biomedical scientist  Tobacco Use  . Smoking status: Former Smoker    Years: 1.00    Types: Cigarettes  . Smokeless tobacco: Never Used  . Tobacco comment: "quit smoking in ~ 1952  Vaping Use  . Vaping Use: Never used  Substance and Sexual Activity  . Alcohol use: Yes    Comment: 05/30/2014 "glass of wine ~ once/month"  . Drug use: No  . Sexual activity: Yes  Other Topics Concern  . Not on file  Social History Narrative  . Not on file   Social Determinants of Health   Financial Resource Strain:   . Difficulty of Paying Living Expenses:   Food Insecurity:   . Worried About Programme researcher, broadcasting/film/video in the Last Year:   . Barista in the Last Year:   Transportation Needs:   . Freight forwarder (Medical):   [name] Lack of Transportation (Non-Medical):   Physical Activity:   . Days of Exercise per Week:   .  Minutes of Exercise per Session:   Stress:   . Feeling of Stress :   Social Connections:   . Frequency of Communication with Friends and Family:   . Frequency of Social Gatherings with Friends and Family:   . Attends Religious Services:   . Active Member of Clubs or Organizations:   . Attends Banker Meetings:   [name] Marital Status:   Intimate Partner Violence:   . Fear of Current or Ex-Partner:   . Emotionally Abused:   [name] Physically Abused:   . Sexually Abused:     Family History:    Family History  Problem Relation Age of Onset  . Heart attack Neg Hx      ROS:  Please see the history of present illness.  ROS  All other ROS reviewed and negative.     Physical Exam/Data:   Vitals:   05/17/20 0519 05/17/20 0822 05/17/20 1005 05/17/20 1112  BP: (!) 111/50 (!) 129/46 (!) 101/43 (!) 110/54  Pulse: (!) 57 74 64 60  Resp: Temp: 98.1 F (36.7 C) 98.1 F (36.7 C) 98.3 F (36.8 C)   TempSrc: Oral Oral Axillary   SpO2: 93% 99% 98%   Weight:      Height:        Intake/Output Summary (Last 24 hours) at 05/17/2020 1127 Last data filed at 05/17/2020 1034 Gross per 24 hour  Intake 697 ml  Output 4425 ml  Net -3728 ml   Filed Weights   05/15/20 0635 05/16/20 0500 05/17/20 0500  Weight: 76.2 kg 79.5 kg 71.5 kg   Body mass index is 22.62 kg/m.  General:  Elderly gentleman sitting in bedside chair in no acute distress; falls asleep while I am talking with his daughter.  HEENT: sclera anicteric  Neck: no JVD Vascular: No carotid bruits; distal pulses 2+ bilaterally Cardiac:  normal S1, S2; RRR; +murmur, no rubs or gallops  Lungs:  clear to auscultation bilaterally, no wheezing, rhonchi or rales  Abd: NABS, soft, nontender, no hepatomegaly Ext: 2+ LE pitting edema Musculoskeletal:  No deformities, BUE and BLE strength normal and equal Skin: warm and dry  Neuro:  CNs 2-12 intact, no focal abnormalities noted Psych:  Normal affect   EKG:  The EKG was  personally reviewed and demonstrates:  demand pacing with underlying atrial fibrillation with  rate 62 bpm, chronic anterolateral TWI; no significant change from previous.  Telemetry:  Telemetry was personally reviewed and demonstrates:  V paced rhythm  Relevant CV Studies: Echocardiogram 01/2020: 1. Left ventricular ejection fraction, by estimation, is 40%%. The left  ventricle has moderately decreased function. The left ventricle  demonstrates global hypokinesis. The left ventricular internal cavity size  was mildly to moderately dilated. Left  ventricular diastolic parameters are indeterminate.  2. Right ventricular systolic function is mildly reduced. The right  ventricular size is normal. mildly increased right ventricular wall  thickness. There is moderately elevated pulmonary artery systolic  pressure.  3. Left atrial size was severely dilated.  4. Right atrial size was severely dilated.  5. The mitral valve is normal in structure. Mild to moderate mitral valve  regurgitation.  6. Tricuspid valve regurgitation is severe.  7. AV is thickened, calcified with restricted motion. Peak and mean  gradients through the AV ae 18 and 10 mm Hg respecitvely. AVA calculated  at 1.8 cm2. LVO/AV VTI ratio is 0.53 consistent with mild AS.[name]. Aortic  valve regurgitation is mild to moderate.  8. The inferior vena cava is dilated in size with <50% respiratory  variability, suggesting right atrial pressure of 15 mmHg.   Comparison(s): The left ventricular function is worsened. 11/20/15 EF  45-50%.   Laboratory Data:  Chemistry Recent Labs  Lab 05/15/20 0652 05/16/20 0316 05/17/20 0420  NA 135 134* 133*  K 4.0 3.9 3.6  CL 97* 94* 91*  CO2 26 26 26   GLUCOSE 129* 125* 136*  BUN 43* 44* 38*  CREATININE 1.70* 1.52* 1.48*  CALCIUM 8.9 8.9 9.0  GFRNONAA 35* 40* 41*  GFRAA 41* 46* 48*  ANIONGAP 12 14 16*    Recent Labs  Lab 05/16/20 0316  PROT 7.5  ALBUMIN 3.1*  AST 30  ALT 20    ALKPHOS 119  BILITOT 1.5*   Hematology Recent Labs  Lab 05/15/20 0652  WBC 5.9  RBC 2.85*  HGB 9.2*  HCT 29.4*  MCV 103.2*  MCH 32.3  MCHC 31.3  RDW 14.9  PLT 116*   Cardiac EnzymesNo results for input(s): TROPONINI in the last 168 hours. No results for input(s): TROPIPOC in the last 168 hours.  BNPNo results for input(s): BNP, PROBNP in the last 168 hours.  DDimer No results for input(s): DDIMER in the last 168 hours.  Radiology/Studies:  No results found.  Assessment and Plan:   1. Acute on chronic combined CHF: patient presented with significant scrotal edema and LE edema. He was started on lasix 60mg  TID>BID. He received metolazone 2.5mg  x 2 doses as well. He is diuresing well with UOP is net -4.0L in the past 24 hours and -6.6L this admission. Weight is down to 157lbs from 175lbs yesterday. Cr was 1.7 on admission, down to 1.48 now. Unclear what prompted his exacerbation as he has not changed his diet and reports medication compliance. Echo 01/2020 showed EF 40% (down from 45-50% in 2017). Home irbesartan is on hold to allow room in Cr for diuresis.  - Continue IV lasix 60mg  BID for now - Will add compression stockings today.  - Continue carvedilol - Hopeful to restart home ARB if Cr stabilizes.  - Continue to monitor strict I&Os and daily weights - Continue to monitor electrolytes closely and replete to maintain K >4, Mg >2  2. Paroxysmal atrial fibrillation: V paced rhythm on telemetry. No complaints of palpitations. No complaints of bleeding on pradaxa. Rates well controlled -  Continue pradaxa for stroke ppx - Continue carvedilol for rate control  3. Valvulopathy: moderate MR and severe TR on echo 01/2020. Felt to be a poor candidate for repair per Dr. Allyson Sabal. Likely contributing to #1.  - Continue diuresis as above.   4. HTN: BP stable - Continue carvedilol and lasix  5. HLD: No recent lipids - Continue atorvastatin  6. DM type 2: A1C 7.4 - Continue management  per primary team  7. CKD stage 3: Cr improved to 1.4 today from 1.7 on admission. Home irbesartan on hold to allow room for diuresis - Continue to monitor closely  8. Anemia/thrombocytopenia: recently underwent IVIG for management of ITP. Hgb/PLT's stable this admission. No complaints of bleeding - Continue to monitor closely   For questions or updates, please contact [facility] Please consult [email or web address] for contact info under Cardiology/STEMI.   Signed, Beatriz Stallion, PA-C  05/17/2020 11:27 AM [phone]  I have seen and examined the patient along with Beatriz Stallion, PA-C.  I have reviewed the chart, notes and new data.  I agree with PA/NP's note.  Key new complaints: scrotal edema better, no dyspnea. Pain L hip (preceds this admission) Key examination changes: mild JVD 5 cm, clear lungs, still w substantial pedal edema (3+ bilat), but has wrinkling in the pretibial area Key new findings / data: creat better w diuresis  PLAN: Cause of exacerbation is not evident: he eats sausage daily, but this is not new. He has lost 18 lb and I estimate he still has about 8 lb to lose. Predominantly R heart failure that occurred gradually. With careful weight monitoring, we should be able to detect and treat future events before they cause severe symptoms. Critical to monitor daily weights. His daughter states that they are arranging daily HH visits, which could ensure compliance with weight monitoring. Continue IV diuretics at least another 24 h.  Thurmon Fair, MD, [facility] [phone] 05/17/2020, 1:12 PM

## 2020-05-17 NOTE — Evaluation (Signed)
Physical Therapy Evaluation Patient Details Name: Ross Morgan MRN: [id number] DOB: 1928/07/28 Today's Date: 05/17/2020   History of Present Illness  86 year old male with past medical history for hypertension, dyslipidemia, type 2 diabetes mellitus, atrial fibrillation, history of CVA, systolic congestive heart failure, chronic kidney disease , BPH who presents with worsening peripheral edema.  Clinical Impression  The patient is very pleasant and able to ambulate in room. Condom cath was not intact and given Lasix so limited ambulation with incontinence. The patient plans to return to [facility] with 24/7 caregivers. Pt admitted with above diagnosis.  Pt currently with functional limitations due to the deficits listed below (see PT Problem List). Pt will benefit from skilled PT to increase their independence and safety with mobility to allow discharge to the venue listed below.       Follow Up Recommendations Home health PT    Equipment Recommendations  None recommended by PT    Recommendations for Other Services       Precautions / Restrictions Precautions Precautions: Fall Precaution Comments: pain in R leg      Mobility  Bed Mobility               General bed mobility comments: in recliner  Transfers Overall transfer level: Needs assistance Equipment used: Rolling walker (2 wheeled) Transfers: Sit to/from Stand Sit to Stand: Mod assist            Ambulation/Gait Ambulation/Gait assistance: Min assist Gait Distance (Feet): 20 Feet Assistive device: Rolling walker (2 wheeled) Gait Pattern/deviations: Step-through pattern;Drifts right/left;Trunk flexed Gait velocity: decr   General Gait Details: patient required some assistance to manage RW  Stairs            Wheelchair Mobility    Modified Rankin (Stroke Patients Only)       Balance Overall balance assessment: Needs assistance Sitting-balance support: Bilateral upper extremity  supported Sitting balance-Leahy Scale: Fair     Standing balance support: Bilateral upper extremity supported Standing balance-Leahy Scale: Poor                               Pertinent Vitals/Pain      Home Living Family/patient expects to be discharged to:: Private residence Living Arrangements: Alone Available Help at Discharge: Family;Available 24 hours/day Type of Home: Apartment Home Access: Level entry         Additional Comments: pt lives at [facility] and daugther is calling  to arrange 24/7 A    Prior Function                 Hand Dominance        Extremity/Trunk Assessment   Upper Extremity Assessment Upper Extremity Assessment: Generalized weakness    Lower Extremity Assessment Lower Extremity Assessment: Generalized weakness    Cervical / Trunk Assessment Cervical / Trunk Assessment: Kyphotic  Communication      Cognition                                              General Comments      Exercises     Assessment/Plan    PT Assessment Patient needs continued PT services  PT Problem List Decreased strength;Decreased balance;Decreased knowledge of precautions;Decreased range of motion;Decreased mobility;Decreased activity tolerance;Decreased safety awareness       PT Treatment Interventions DME  instruction;Therapeutic activities;Gait training;Functional mobility training;Therapeutic exercise;Patient/family education    PT Goals (Current goals can be found in the Care Plan section)  Acute Rehab PT Goals Patient Stated Goal: back to [facility] PT Goal Formulation: With patient/family Time For Goal Achievement: 05/31/20    Frequency Min 3X/week   Barriers to discharge        Co-evaluation               AM-PAC PT "6 Clicks" Mobility  Outcome Measure Help needed turning from your back to your side while in a flat bed without using bedrails?: A Lot Help needed moving from lying on your back  to sitting on the side of a flat bed without using bedrails?: A Lot Help needed moving to and from a bed to a chair (including a wheelchair)?: A Lot Help needed standing up from a chair using your arms (e.g., wheelchair or bedside chair)?: A Lot Help needed to walk in hospital room?: A Lot Help needed climbing 3-5 steps with a railing? : A Lot 6 Click Score: 12    End of Session Equipment Utilized During Treatment: Gait belt Activity Tolerance: Patient tolerated treatment well Patient left: in chair;with call bell/phone within reach;with nursing/sitter in room;with family/visitor present Nurse Communication: Mobility status PT Visit Diagnosis: Unsteadiness on feet (R26.81);Difficulty in walking, not elsewhere classified (R26.2)    Time: 1100-1136 PT Time Calculation (min) (ACUTE ONLY): 36 min   Charges:   PT Evaluation $PT Eval Low Complexity: 1 Low PT Treatments $Gait Training: 8-22 mins        Blanchard Kelch PT Acute Rehabilitation Services Pager [phone] Office [phone]   Rada Hay 05/17/2020, 1:15 PM

## 2020-05-18 ENCOUNTER — Inpatient Hospital Stay (HOSPITAL_COMMUNITY): Payer: Medicare PPO

## 2020-05-18 ENCOUNTER — Telehealth: Payer: Self-pay | Admitting: Cardiovascular Disease

## 2020-05-18 LAB — GLUCOSE, CAPILLARY
Glucose-Capillary: 122 mg/dL — ABNORMAL HIGH (ref 70–99)
Glucose-Capillary: 269 mg/dL — ABNORMAL HIGH (ref 70–99)
Glucose-Capillary: 113 mg/dL — ABNORMAL HIGH (ref 70–99)
Glucose-Capillary: 260 mg/dL — ABNORMAL HIGH (ref 70–99)

## 2020-05-18 LAB — CBC
HCT: 28.7 % — ABNORMAL LOW (ref 39.0–52.0)
Hemoglobin: 9.3 g/dL — ABNORMAL LOW (ref 13.0–17.0)
MCH: 32.5 pg (ref 26.0–34.0)
MCHC: 32.4 g/dL (ref 30.0–36.0)
MCV: 100.3 fL — ABNORMAL HIGH (ref 80.0–100.0)
Platelets: 137 10*3/uL — ABNORMAL LOW (ref 150–400)
RBC: 2.86 MIL/uL — ABNORMAL LOW (ref 4.22–5.81)
RDW: 14.6 % (ref 11.5–15.5)
WBC: 5.9 10*3/uL (ref 4.0–10.5)
nRBC: 0 % (ref 0.0–0.2)

## 2020-05-18 LAB — RENAL FUNCTION PANEL
Albumin: 3.1 g/dL — ABNORMAL LOW (ref 3.5–5.0)
Anion gap: 11 (ref 5–15)
BUN: 38 mg/dL — ABNORMAL HIGH (ref 8–23)
CO2: 32 mmol/L (ref 22–32)
Calcium: 8.8 mg/dL — ABNORMAL LOW (ref 8.9–10.3)
Chloride: 92 mmol/L — ABNORMAL LOW (ref 98–111)
Creatinine, Ser: 1.42 mg/dL — ABNORMAL HIGH (ref 0.61–1.24)
GFR calc Af Amer: 50 mL/min — ABNORMAL LOW (ref >60)
GFR calc non Af Amer: 43 mL/min — ABNORMAL LOW (ref >60)
Glucose, Bld: 153 mg/dL — ABNORMAL HIGH (ref 70–99)
Phosphorus: 3.5 mg/dL (ref 2.5–4.6)
Potassium: 3.5 mmol/L (ref 3.5–5.1)
Sodium: 135 mmol/L (ref 135–145)

## 2020-05-18 MED ORDER — SENNOSIDES-DOCUSATE SODIUM 8.6-50 MG PO TABS
1.0000 | ORAL_TABLET | Freq: Every day | ORAL | Status: DC
  Administered 2020-05-18: 1 via ORAL
  Filled 2020-05-18: qty 1

## 2020-05-18 MED ORDER — POTASSIUM CHLORIDE CRYS ER 20 MEQ PO TBCR
60.0000 meq | EXTENDED_RELEASE_TABLET | Freq: Once | ORAL | Status: AC
  Administered 2020-05-18: 60 meq via ORAL
  Filled 2020-05-18: qty 3

## 2020-05-18 MED ORDER — TORSEMIDE 20 MG PO TABS
40.0000 mg | ORAL_TABLET | Freq: Two times a day (BID) | ORAL | Status: DC
  Administered 2020-05-18 – 2020-05-19 (×2): 40 mg via ORAL
  Filled 2020-05-18 (×2): qty 2

## 2020-05-18 NOTE — Telephone Encounter (Signed)
Patient currently admitted

## 2020-05-18 NOTE — Progress Notes (Signed)
Patient c/o of severe left leg pain.  Patient received Tramadol previously.  Dr. Jarvis Newcomer notified via text page.

## 2020-05-18 NOTE — Discharge Instructions (Signed)

## 2020-05-18 NOTE — Telephone Encounter (Signed)
Patient has TOC appt scheduled on 7/13 with Azalee Course.

## 2020-05-18 NOTE — Care Management Important Message (Signed)
Important Message  Patient Details IM Letter given to Ezekiel Ina RN Case Manager to present to the Patient Name: Ross Morgan MRN: [id number] Date of Birth: January 12, 1928   Medicare Important Message Given:  Yes     Caren Macadam 05/18/2020, 12:44 PM

## 2020-05-18 NOTE — Progress Notes (Addendum)
Progress Note  Patient Name: Ross Morgan Date of Encounter: 05/18/2020  Primary Cardiologist: Nanetta Batty, MD   Subjective   Feeling improved this morning. Swelling continues to go down. Still having right leg pain. XR ordered by primary team. No complaints of chest pain or SOB. We discussed the importance of maintaining a low sodium diet and checking weights daily in an effort to stay ahead of volume overload.   Inpatient Medications    Scheduled Meds: . atorvastatin  20 mg Oral QHS  . carvedilol  3.125 mg Oral BID WC  . dabigatran  150 mg Oral BID  . furosemide  60 mg Intravenous Q12H  . insulin aspart  0-15 Units Subcutaneous TID WC  . polyethylene glycol  17 g Oral Daily  . sodium chloride flush  3 mL Intravenous Q12H  . tamsulosin  0.4 mg Oral Daily  . venlafaxine XR  37.5 mg Oral Q breakfast   Continuous Infusions: . sodium chloride     PRN Meds: sodium chloride, acetaminophen, metoprolol tartrate, oxyCODONE-acetaminophen, sodium chloride flush   Vital Signs    Vitals:   05/18/20 0539 05/18/20 1004 05/18/20 1115 05/18/20 1153  BP: (!) 130/53 (!) 111/47 (!) 118/47   Pulse: 64 66 64   Resp: 17 16    Temp: 98 F (36.7 C) 99.2 F (37.3 C) 99.1 F (37.3 C)   TempSrc: Oral Oral Oral   SpO2: 95% 97% 92%   Weight:    73.5 kg  Height:        Intake/Output Summary (Last 24 hours) at 05/18/2020 1206 Last data filed at 05/18/2020 1052 Gross per 24 hour  Intake 600 ml  Output 1550 ml  Net -950 ml   Filed Weights   05/16/20 0500 05/17/20 0500 05/18/20 1153  Weight: 79.5 kg 71.5 kg 73.5 kg    Telemetry    V-paced - Personally Reviewed  ECG    No new tracings - Personally Reviewed  Physical Exam   GEN: No acute distress.   Neck: No JVD, no carotid bruits Cardiac: RRR, +murmur, no rubs or gallops.  Respiratory: Clear to auscultation bilaterally, no wheezes/ rales/ rhonchi GI: NABS, Soft, nontender, non-distended  MS: 1+ LE edema; No  deformity. Neuro:  Nonfocal, moving all extremities spontaneously Psych: Normal affect   Labs    Chemistry Recent Labs  Lab 05/16/20 0316 05/17/20 0420 05/18/20 0431  NA 134* 133* 135  K 3.9 3.6 3.5  CL 94* 91* 92*  CO2 26 26 32  GLUCOSE 125* 136* 153*  BUN 44* 38* 38*  CREATININE 1.52* 1.48* 1.42*  CALCIUM 8.9 9.0 8.8*  PROT 7.5  --   --   ALBUMIN 3.1*  --  3.1*  AST 30  --   --   ALT 20  --   --   ALKPHOS 119  --   --   BILITOT 1.5*  --   --   GFRNONAA 40* 41* 43*  GFRAA 46* 48* 50*  ANIONGAP 14 16* 11     Hematology Recent Labs  Lab 05/15/20 0652 05/18/20 0431  WBC 5.9 5.9  RBC 2.85* 2.86*  HGB 9.2* 9.3*  HCT 29.4* 28.7*  MCV 103.2* 100.3*  MCH 32.3 32.5  MCHC 31.3 32.4  RDW 14.9 14.6  PLT 116* 137*    Cardiac EnzymesNo results for input(s): TROPONINI in the last 168 hours. No results for input(s): TROPIPOC in the last 168 hours.   BNPNo results for input(s): BNP, PROBNP in the  last 168 hours.   DDimer No results for input(s): DDIMER in the last 168 hours.   Radiology    No results found.  Cardiac Studies   Echocardiogram 01/2020: 1. Left ventricular ejection fraction, by estimation, is 40%%. The left  ventricle has moderately decreased function. The left ventricle  demonstrates global hypokinesis. The left ventricular internal cavity size  was mildly to moderately dilated. Left  ventricular diastolic parameters are indeterminate.  2. Right ventricular systolic function is mildly reduced. The right  ventricular size is normal. mildly increased right ventricular wall  thickness. There is moderately elevated pulmonary artery systolic  pressure.  3. Left atrial size was severely dilated.  4. Right atrial size was severely dilated.  5. The mitral valve is normal in structure. Mild to moderate mitral valve  regurgitation.  6. Tricuspid valve regurgitation is severe.  7. AV is thickened, calcified with restricted motion. Peak and mean   gradients through the AV ae 18 and 10 mm Hg respecitvely. AVA calculated  at 1.8 cm2. LVO/AV VTI ratio is 0.53 consistent with mild AS.[name] Aortic  valve regurgitation is mild to moderate.  8. The inferior vena cava is dilated in size with <50% respiratory  variability, suggesting right atrial pressure of 15 mmHg.   Comparison(s): The left ventricular function is worsened. 11/20/15 EF  45-50%.   Patient Profile     86 y.o. male with a PMH of chronic combined CHF, paroxysmal atrial fibrillation on pradaxa, symptomatic bradycardia s/p PPM, moderate mitral regurgitation, HTN, HLD, DM type 2, CKD stage 3, and TIA, who is being followed by cardiology for the evaluation of acute on chronic CHF   Assessment & Plan    1. Acute on chronic combined CHF: patient presented with significant scrotal edema and LE edema. He was started on lasix 60mg  TID>BID. He received metolazone 2.5mg  x 2 doses as well. He is diuresing well with UOP is net -1.0L in the past 24 hours (not including several unmeasured urine output occurrences) and -7.6L this admission. Weight is 162lbs today, up from 157lbs yesterday and 175lbs 7/6. Cr was 1.7 on admission, down to 1.42 today. Unclear what prompted his exacerbation as he has not changed his diet and reports medication compliance. Echo 01/2020 showed EF 40% (down from 45-50% in 2017). Home irbesartan is on hold to allow room in Cr for diuresis.  - Continue IV lasix 60mg  BID for now - anticipate transition to po tomorrow - Continue compression stockings.  - Continue carvedilol - Hopeful to restart home ARB if Cr stabilizes.  - Continue to monitor strict I&Os and daily weights - Continue to monitor electrolytes closely and replete to maintain K >4, Mg >2 - will give K 60 mEq today  2. Paroxysmal atrial fibrillation: V paced rhythm on telemetry. No complaints of palpitations. No complaints of bleeding on pradaxa. Rates well controlled - Continue pradaxa for stroke ppx - Continue  carvedilol for rate control  3. Valvulopathy: moderate MR and severe TR on echo 01/2020. Felt to be a poor candidate for repair per Dr. . Likely contributing to #1.  - Continue diuresis as above.   4. HTN: BP stable - Continue carvedilol and lasix  5. HLD: No recent lipids - Continue atorvastatin  6. DM type 2: A1C 7.4 - Continue management per primary team  7. CKD stage 3: Cr improved to 1.42 today from 1.7 on admission. Home irbesartan on hold to allow room for diuresis - Continue to monitor closely  8. Anemia/thrombocytopenia:  recently underwent IVIG for management of ITP. Hgb/PLT's stable this admission. No complaints of bleeding - Continue to monitor closely  For questions or updates, please contact [facility] Please consult [email or web address] for contact info under Cardiology/STEMI.      Signed, Beatriz Stallion, PA-C  05/18/2020, 12:06 PM   [phone]   I have seen and examined the patient along with Beatriz Stallion, PA-C.  I have reviewed the chart, notes and new data.  I agree with PA/NP's note.  Key new complaints: edema improving, no orthopnea, excellent UO Key examination changes: JVP 6 cm, 1-2+ edema limited to ankles and feet, clear lungs Key new findings / data: improved platelet count; creat slowly improving despite diuresis  PLAN: Switch to po diuretics and anticipate DC in 24 h. Restart ARB tomorrow. Daily weight monitoring and meticulous avoidance of high salt foods will be critical to avoid re-admission. He will have home health to assist with meds and weight monitoring. Especially important to evaluate after IVIG due to large plasma volume expansion. Improved platelet count.  Thurmon Fair, MD, [facility] [phone] 05/18/2020, 2:59 PM

## 2020-05-18 NOTE — Progress Notes (Signed)
Occupational Therapy Treatment Patient Details Name: Ross Morgan MRN: [id number] DOB: October 27, 1928 Today's Date: 05/18/2020    History of present illness -year-old male with past medical history for hypertension, dyslipidemia, type 2 diabetes mellitus, atrial fibrillation, history of CVA, systolic congestive heart failure, chronic kidney disease , BPH who presents with worsening peripheral edema.   OT comments  Patient demonstrates improving mobility - min assist for bed transfer and ambulation in room and hallway with RW. Patient able to stand at sink and perform grooming and perform standing toileting with min assist for steadying. Cont POC   Follow Up Recommendations  Home health OT;Supervision/Assistance - 24 hour    Equipment Recommendations  None recommended by OT    Recommendations for Other Services      Precautions / Restrictions Precautions Precautions: Fall Precaution Comments: pain in R leg Restrictions Weight Bearing Restrictions: No       Mobility Bed Mobility   Bed Mobility: Supine to Sit     Supine to sit: Min assist;HOB elevated        Transfers   Equipment used: Rolling walker (2 wheeled) Transfers: Sit to/from Stand Sit to Stand: Min assist Stand pivot transfers: Min assist       General transfer comment: Min assist for all transfers. Ambulated to bathroom with Rw and then in hall approx 50 feet.    Balance Overall balance assessment: Needs assistance Sitting-balance support: No upper extremity supported;Feet supported Sitting balance-Leahy Scale: Fair     Standing balance support: Bilateral upper extremity supported Standing balance-Leahy Scale: Fair                             ADL either performed or assessed with clinical judgement   ADL       Grooming: Oral care;Wash/dry hands;Min guard;Standing Grooming Details (indicate cue type and reason): Stood at sink to perform grooming with min guard                  Toilet Transfer: Total assistance Toilet Transfer Details (indicate cue type and reason): Total assist to donn socks x 2. Patinet wouldn't attempt reporting its difficuty with the tight socks Toileting- Clothing Manipulation and Hygiene: Minimal assistance;Cueing for safety Toileting - Clothing Manipulation Details (indicate cue type and reason): min assist for steadying during toileting. cues for safety and to use grab bar.     Functional mobility during ADLs: Minimal assistance;Rolling walker General ADL Comments: Min assist to correct mild balance losses during ambulation with RW.     Vision       Perception     Praxis      Cognition Arousal/Alertness: Awake/alert Behavior During Therapy: WFL for tasks assessed/performed Overall Cognitive Status: Within Functional Limits for tasks assessed                                          Exercises     Shoulder Instructions       General Comments      Pertinent Vitals/ Pain       Pain Assessment: Faces Faces Pain Scale: Hurts a little bit Pain Location: RLE Pain Descriptors / Indicators: Grimacing;Discomfort Pain Intervention(s): Monitored during session  Home Living  Prior Functioning/Environment              Frequency  Min 2X/week        Progress Toward Goals  OT Goals(current goals can now be found in the care plan section)     Acute Rehab OT Goals Patient Stated Goal: back to [facility] OT Goal Formulation: With patient Time For Goal Achievement: 05/23/20 Potential to Achieve Goals: Good  Plan      Co-evaluation                 AM-PAC OT "6 Clicks" Daily Activity     Outcome Measure   Help from another person eating meals?: A Little Help from another person taking care of personal grooming?: A Little Help from another person toileting, which includes using toliet, bedpan, or urinal?: A Little Help from another  person bathing (including washing, rinsing, drying)?: A Lot Help from another person to put on and taking off regular upper body clothing?: A Little Help from another person to put on and taking off regular lower body clothing?: Total 6 Click Score: 15    End of Session Equipment Utilized During Treatment: Rolling walker;Gait belt  OT Visit Diagnosis: Unsteadiness on feet (R26.81);Other abnormalities of gait and mobility (R26.89);Muscle weakness (generalized) (M62.81)   Activity Tolerance Patient tolerated treatment well   Patient Left in chair;with call bell/phone within reach;with chair alarm set;with family/visitor present   Nurse Communication  (okay to see per RN. Communicated via board)        Time: 1610-9604 OT Time Calculation (min): 33 min  Charges: OT General Charges $OT Visit: 1 Visit OT Treatments $Self Care/Home Management : 8-22 mins $Therapeutic Activity: 8-22 mins  Zailen Albarran, OTR/L Acute Care Rehab Services  Office [phone] Pager: [phone]    Kelli Churn 05/18/2020, 11:18 AM

## 2020-05-18 NOTE — Progress Notes (Signed)
PROGRESS NOTE  Diaz Crago  CWC:[id number] DOB: 1927-11-28 DOA: 05/15/2020 PCP: [name], MD  Outpatient Specialists: Cardiology, Dr. Allyson Sabal Brief Narrative: Nikolaus Pienta is an 86 y.o. male with a history of chronic HFrEF, stage IIIb CKD, AFib, T2DM, HTN, HLD, CVA, ITP and BPH who presented from [facility] with progressively worsening leg and scrotal edema despite changing furosemide to torsemide and metolazone which caused AKI. On presentation he was grossly volume overloaded with clear lungs. Lasix has been given with improved symptoms, though he remains overloaded. Cardiology was consulted for further recommendations.   Assessment & Plan: Principal Problem:   Acute on chronic systolic CHF (congestive heart failure) (HCC) Active Problems:   Essential hypertension   Hyperlipidemia   Bradycardia, sinus   Atrial fibrillation, slow VR and pauses   Type II diabetes mellitus (HCC)   Stroke (cerebrum) (HCC)   BPH (benign prostatic hyperplasia)   Diabetes mellitus with complication (HCC)   Bilateral lower extremity edema   Chronic ITP (idiopathic thrombocytopenia) (HCC)   Chronic anticoagulation   HOH (hard of hearing)   CRI (chronic renal insufficiency), stage 3 (moderate)   Scrotal edema   Heart failure (HCC)  Acute on chronic combined and biventricular CHF with valvular heart disease: Predominantly right-sided symptoms with clear pulmonary exam. Echo March 2021 w/LVEF 40%, global hypokinesis, indeterminate diastolic function, RV function reduced with elevated PASP, severe biatrial enlargement, mild-mod MR, severe TR, mild AS and mild-mod AR.  - Appreciate cardiology consultation. Diuresis thus far has been effective with improvement in renal congestion. Will monitor I/O, daily weights, and BMP. Continue lasix 60mg  IV (3.5L UOP/24hrs, weight down 175lbs > 157 > 162lbs, unclear EDW). This degree of diuresis requires monitoring of metabolic panel daily. Plan to transition to po 7/9 and  discharge if stable.  - Guideline directed therapy including beta blocker is continued. ARB being held with renal impairment and need for diuresis. Can likely restart at discharge.  AKI on stage IIIb CKD: continues improvement with diuresis.  - Monitor renal function closely with aggressive diuresis. Anticipate continued improvement as renovascular congestion is relieved.  - Avoid NSAIDs  Left leg pain: Exam reassuring with point tenderness overlaying lateral upper leg without visible or palpable abnormality. Possibly some spasm of IT band which may be related to hemiparesis following stroke. Muscle cramping due to aggressive diuresis also possible. DVT previously ruled out by U/S.  - Heating pad, OOB as this helps pain.  - Antispasmodic may be of benefit, though will try other treatments before using potentially sedating medications.  - Check XR to r/o hip pathology with distally referred pain.   Paroxysmal atrial fibrillation, history of symptomatic bradycardia s/p PPM 2015 [employer] DDD permanent pacemaker (serial [id number]) inserted:  - Continue coreg and dabigatran  T2DM:  - Continue SSI  Anemia of chronic disease and ITP: - Platelet count improving significantly s/p IVIG.   HLD:  - continue statin  HTN:  - Continue BP medications as above.   History of CVA: Eliquis changed to pradaxa following TIA admission. - Continue anticoagulation and statin  BPH: No evidence of urinary retention at this time.  - Continue flomax, condom catheter.  DVT prophylaxis: Dabigatran Code Status: DNR Family Communication: Daughter at bedside Disposition Plan:  Status is: Inpatient  Remains inpatient appropriate because:Hemodynamically unstable, Persistent severe electrolyte disturbances and Inpatient level of care appropriate due to severity of illness. Patient's mobility is adversely affected by degree of sctroal and lower extremity swelling. With additional diuresis, this is expected to  improve  and decrease risk of immobility leading to functional decline and possible readmission.  Dispo: The patient is from: [facility]              Anticipated d/c is to: [facility] w/maximal HH              Anticipated d/c date is: 2 days              Patient currently is not medically stable to d/c.  Consultants:   Cardiology, Dr. Royann Shivers  Procedures:   Echocardiogram  Antimicrobials:  None   Subjective: Dr. Tresa Moore reports significant urine out put but worsening pain in the left thigh that is intermittent, improved with ambulation, not improved with tylenol at all. No chest pain. He wants to get up but is more unsteady with swollen lower extremities and has significant scrotal pain due to compression with ambulation.  Objective: Vitals:   05/18/20 1004 05/18/20 1115 05/18/20 1153 05/18/20 1705  BP: (!) 111/47 (!) 118/47  (!) 98/48  Pulse: 66 64    Resp: 16     Temp: 99.2 F (37.3 C) 99.1 F (37.3 C)    TempSrc: Oral Oral    SpO2: 97% 92%    Weight:   73.5 kg   Height:        Intake/Output Summary (Last 24 hours) at 05/18/2020 1844 Last data filed at 05/18/2020 1706 Gross per 24 hour  Intake 360 ml  Output 1100 ml  Net -740 ml   Filed Weights   05/16/20 0500 05/17/20 0500 05/18/20 1153  Weight: 79.5 kg 71.5 kg 73.5 kg   Gen: 86 y.o. male in no distress Pulm: Nonlabored breathing room air. Clear. CV: Irreg, II/VI systolic murmur, rub, or gallop. JVP ~6, 1+ pitting LE edema improved with TED hose. Scrotal swelling improved from prior exam, but remains severe.  GI: Abdomen soft, non-tender, non-distended, with normoactive bowel sounds.  Ext: Warm, no deformities. LLE with point tenderness at origin and insertion of IT band without erythema, induration, fluctuance. Knee unremarkable. No pain with hip ROM. Skin: No new rashes, lesions or ulcers on visualized skin. Neuro: Alert and oriented. No focal neurological deficits. Psych: Judgement and insight appear fair. Mood  euthymic & affect congruent. Behavior is appropriate.    Data Reviewed: I have personally reviewed following labs and imaging studies  CBC: Recent Labs  Lab 05/15/20 0652 05/18/20 0431  WBC 5.9 5.9  NEUTROABS 4.0  --   HGB 9.2* 9.3*  HCT 29.4* 28.7*  MCV 103.2* 100.3*  PLT 116* 137*   Basic Metabolic Panel: Recent Labs  Lab 05/15/20 0652 05/16/20 0316 05/17/20 0420 05/18/20 0431  NA 135 134* 133* 135  K 4.0 3.9 3.6 3.5  CL 97* 94* 91* 92*  CO2 26 26 26  32  GLUCOSE 129* 125* 136* 153*  BUN 43* 44* 38* 38*  CREATININE 1.70* 1.52* 1.48* 1.42*  CALCIUM 8.9 8.9 9.0 8.8*  PHOS  --   --   --  3.5   GFR: Estimated Creatinine Clearance: 36.4 mL/min (A) (by C-G formula based on SCr of 1.42 mg/dL (H)). Liver Function Tests: Recent Labs  Lab 05/16/20 0316 05/18/20 0431  AST 30  --   ALT 20  --   ALKPHOS 119  --   BILITOT 1.5*  --   PROT 7.5  --   ALBUMIN 3.1* 3.1*   No results for input(s): LIPASE, AMYLASE in the last 168 hours. No results for input(s): AMMONIA in the last  168 hours. Coagulation Profile: No results for input(s): INR, PROTIME in the last 168 hours. Cardiac Enzymes: No results for input(s): CKTOTAL, CKMB, CKMBINDEX, TROPONINI in the last 168 hours. BNP (last 3 results) No results for input(s): PROBNP in the last 8760 hours. HbA1C: No results for input(s): HGBA1C in the last 72 hours. CBG: Recent Labs  Lab 05/17/20 1647 05/17/20 2136 05/18/20 0721 05/18/20 1149 05/18/20 1623  GLUCAP 146* 198* 122* 269* 113*   Lipid Profile: No results for input(s): CHOL, HDL, LDLCALC, TRIG, CHOLHDL, LDLDIRECT in the last 72 hours. Thyroid Function Tests: No results for input(s): TSH, T4TOTAL, FREET4, T3FREE, THYROIDAB in the last 72 hours. Anemia Panel: No results for input(s): VITAMINB12, FOLATE, FERRITIN, TIBC, IRON, RETICCTPCT in the last 72 hours. Urine analysis:    Component Value Date/Time   COLORURINE YELLOW 05/15/2020 1019   APPEARANCEUR CLEAR  05/15/2020 1019   LABSPEC 1.008 05/15/2020 1019   PHURINE 6.0 05/15/2020 1019   GLUCOSEU NEGATIVE 05/15/2020 1019   HGBUR NEGATIVE 05/15/2020 1019   BILIRUBINUR NEGATIVE 05/15/2020 1019   KETONESUR NEGATIVE 05/15/2020 1019   PROTEINUR NEGATIVE 05/15/2020 1019   UROBILINOGEN 0.2 04/22/2008 1535   NITRITE NEGATIVE 05/15/2020 1019   LEUKOCYTESUR NEGATIVE 05/15/2020 1019   Recent Results (from the past 240 hour(s))  SARS Coronavirus 2 by RT PCR (hospital order, [facility]) Nasopharyngeal Nasopharyngeal Swab     Status: None   Collection Time: 05/15/20  7:47 AM   Specimen: Nasopharyngeal Swab  Result Value Ref Range Status   SARS Coronavirus 2 NEGATIVE NEGATIVE Final    Comment: (NOTE) SARS-CoV-2 target nucleic acids are NOT DETECTED.  The SARS-CoV-2 RNA is generally detectable in upper and lower respiratory specimens during the acute phase of infection. The lowest concentration of SARS-CoV-2 viral copies this assay can detect is 250 copies / mL. A negative result does not preclude SARS-CoV-2 infection and should not be used as the sole basis for treatment or other patient management decisions.  A negative result may occur with improper specimen collection / handling, submission of specimen other than nasopharyngeal swab, presence of viral mutation(s) within the areas targeted by this assay, and inadequate number of viral copies (<250 copies / mL). A negative result must be combined with clinical observations, patient history, and epidemiological information.  Fact Sheet for Patients:   BoilerBrush.com.cyhttps://www.fda.gov/media/136312/download  Fact Sheet for Healthcare Providers: https://pope.com/https://www.fda.gov/media/136313/download  This test is not yet approved or  cleared by the [place] FDA and has been authorized for detection and/or diagnosis of SARS-CoV-2 by FDA under an Emergency Use Authorization (EUA).  This EUA will remain in effect (meaning this test can be used)  for the duration of the COVID-19 declaration under Section 564(b)(1) of the Act, 21 U.S.C. section 360bbb-3(b)(1), unless the authorization is terminated or revoked sooner.  Performed at [facility], [address] [place], [place] [address]       Radiology Studies: DG HIP UNILAT WITH PELVIS 2-3 VIEWS LEFT  Result Date: 05/18/2020 CLINICAL DATA:  Left hip pain history of multiple fall EXAM: DG HIP (WITH OR WITHOUT PELVIS) 2-3V LEFT COMPARISON:  None. FINDINGS: There is no evidence of hip fracture or dislocation. There is no evidence of arthropathy or other focal bone abnormality. IMPRESSION: Negative. Electronically Signed   By: Jasmine PangKim  Fujinaga M.D.   On: 05/18/2020 15:44    Scheduled Meds: . atorvastatin  20 mg Oral QHS  . carvedilol  3.125 mg Oral BID WC  . dabigatran  150 mg Oral BID  . insulin aspart  0-15 Units Subcutaneous TID WC  . polyethylene glycol  17 g Oral Daily  . senna-docusate  1 tablet Oral QHS  . sodium chloride flush  3 mL Intravenous Q12H  . tamsulosin  0.4 mg Oral Daily  . torsemide  40 mg Oral BID  . venlafaxine XR  37.5 mg Oral Q breakfast   Continuous Infusions: . sodium chloride       LOS: 3 days   Time spent: 35 minutes.  Tyrone Nine, MD [facility] [email or web address] 05/18/2020, 6:44 PM

## 2020-05-19 DIAGNOSIS — I6300 Cerebral infarction due to thrombosis of unspecified precerebral artery: Secondary | ICD-10-CM

## 2020-05-19 LAB — RENAL FUNCTION PANEL
Albumin: 2.9 g/dL — ABNORMAL LOW (ref 3.5–5.0)
Anion gap: 12 (ref 5–15)
BUN: 34 mg/dL — ABNORMAL HIGH (ref 8–23)
CO2: 30 mmol/L (ref 22–32)
Calcium: 8.7 mg/dL — ABNORMAL LOW (ref 8.9–10.3)
Chloride: 94 mmol/L — ABNORMAL LOW (ref 98–111)
Creatinine, Ser: 1.47 mg/dL — ABNORMAL HIGH (ref 0.61–1.24)
GFR calc Af Amer: 48 mL/min — ABNORMAL LOW (ref >60)
GFR calc non Af Amer: 42 mL/min — ABNORMAL LOW (ref >60)
Glucose, Bld: 157 mg/dL — ABNORMAL HIGH (ref 70–99)
Phosphorus: 3.2 mg/dL (ref 2.5–4.6)
Potassium: 4.2 mmol/L (ref 3.5–5.1)
Sodium: 136 mmol/L (ref 135–145)

## 2020-05-19 LAB — GLUCOSE, CAPILLARY: Glucose-Capillary: 154 mg/dL — ABNORMAL HIGH (ref 70–99)

## 2020-05-19 MED ORDER — SITAGLIPTIN PHOS-METFORMIN HCL 50-1000 MG PO TABS: 0.5000 | ORAL_TABLET | Freq: Two times a day (BID) | ORAL | 0 refills | Status: DC

## 2020-05-19 MED ORDER — CYCLOBENZAPRINE HCL 5 MG PO TABS: 2.5000 mg | ORAL_TABLET | Freq: Three times a day (TID) | ORAL | 0 refills | Status: DC | PRN

## 2020-05-19 MED ORDER — BISACODYL 10 MG RE SUPP
10.0000 mg | Freq: Once | RECTAL | Status: AC
  Administered 2020-05-19: 10 mg via RECTAL
  Filled 2020-05-19: qty 1

## 2020-05-19 MED ORDER — TORSEMIDE 20 MG PO TABS
40.0000 mg | ORAL_TABLET | Freq: Two times a day (BID) | ORAL | 0 refills | Status: DC
Start: 2020-05-19 — End: 2020-05-24

## 2020-05-19 NOTE — TOC Progression Note (Signed)
Transition of Care [facility]) - Progression Note    Patient Details  Name: Ross Morgan MRN: [id number] Date of Birth: Jan 14, 1928  [facility]) CM/SW Contact  Geni Bers, RN Phone Number: 05/19/2020, 10:06 AM  Clinical Narrative:    Spoke with pt and daughter at bedside. Daughter has everything set for 24/7 care for pt.         Expected Discharge Plan and Services                                                 Social Determinants of Health (SDOH) Interventions    Readmission Risk Interventions No flowsheet data found.

## 2020-05-19 NOTE — Progress Notes (Signed)
Physical Therapy Treatment Patient Details Name: Ross Morgan MRN: [id number] DOB: [birth year] Today's Date: 05/19/2020    History of Present Illness -year-old male with past medical history for hypertension, dyslipidemia, type 2 diabetes mellitus, atrial fibrillation, history of CVA, systolic congestive heart failure, chronic kidney disease , BPH who presents with worsening peripheral edema.    PT Comments    Assisted OOB to amb in hallway.    Follow Up Recommendations  Home health PT     Equipment Recommendations  None recommended by PT    Recommendations for Other Services       Precautions / Restrictions Precautions Precautions: Fall Precaution Comments: pain in R leg    Mobility  Bed Mobility Overal bed mobility: Needs Assistance Bed Mobility: Supine to Sit     Supine to sit: Min guard;Min assist     General bed mobility comments: increased time and assist with upper body  Transfers Overall transfer level: Needs assistance Equipment used: Rolling walker (2 wheeled) Transfers: Sit to/from Stand Sit to Stand: Min guard;Supervision Stand pivot transfers: Supervision;Min guard       General transfer comment: assisted from elevated bed as well as toilet with <25% VC's on safety with turns.  Good cognition with use of hands to steady self.  Ambulation/Gait Ambulation/Gait assistance: Min guard;Min assist Gait Distance (Feet): 75 Feet Assistive device: Rolling walker (2 wheeled) Gait Pattern/deviations: Step-through pattern;Drifts right/left;Trunk flexed Gait velocity: decr   General Gait Details: patient required some assistance to manage RW but tolerated an increased distance   Stairs             Wheelchair Mobility    Modified Rankin (Stroke Patients Only)       Balance                                            Cognition Arousal/Alertness: Awake/alert Behavior During Therapy: WFL for tasks assessed/performed Overall  Cognitive Status: Within Functional Limits for tasks assessed                                 General Comments: AxO x 4 very pleasant      Exercises      General Comments        Pertinent Vitals/Pain Pain Assessment: Faces Faces Pain Scale: Hurts a little bit Pain Location: RLE Pain Descriptors / Indicators: Tightness Pain Intervention(s): Monitored during session;Repositioned    Home Living                      Prior Function            PT Goals (current goals can now be found in the care plan section) Progress towards PT goals: Progressing toward goals    Frequency    Min 3X/week      PT Plan Current plan remains appropriate    Co-evaluation              AM-PAC PT "6 Clicks" Mobility   Outcome Measure  Help needed turning from your back to your side while in a flat bed without using bedrails?: A Little Help needed moving from lying on your back to sitting on the side of a flat bed without using bedrails?: A Little Help needed moving to and from a bed to a chair (including  a wheelchair)?: A Little Help needed standing up from a chair using your arms (e.g., wheelchair or bedside chair)?: A Little Help needed to walk in hospital room?: A Little Help needed climbing 3-5 steps with a railing? : A Little 6 Click Score: 18    End of Session Equipment Utilized During Treatment: Gait belt Activity Tolerance: Patient tolerated treatment well Patient left: Other (comment) (in bathroom with NT in room) Nurse Communication: Mobility status PT Visit Diagnosis: Unsteadiness on feet (R26.81);Difficulty in walking, not elsewhere classified (R26.2)     Time: 5366-4403 PT Time Calculation (min) (ACUTE ONLY): 18 min  Charges:  $Gait Training: 8-22 mins                     Felecia Shelling  PTA Acute  Rehabilitation Services Pager      [phone] Office      [phone]

## 2020-05-19 NOTE — Discharge Summary (Signed)
Physician Discharge Summary  Ross Morgan RCB:[id number] DOB: April 16, 1928 DOA: 05/15/2020  PCP: [name], MD  Admit date: 05/15/2020 Discharge date: 05/19/2020  [facility] Disposition: [facility] with maximal/24hr home care   Recommendations for Outpatient Follow-up:  1. Follow up with PCP in 1-2 weeks 2. Follow up with cardiology as scheduled 7/13. Increased torsemide to 40mg  po BID at discharge, continuing other medications as below. Last weight before DC was 162lbs.  3. Follow up renal function. Decreased janumet to 25-500mg  BID due to CrCl 19ml/min. If worsens or alternative agent would be appropriate, recommend stopping/substituting.  4. Follow up left leg pain. See below for full details.  Home Health: PT, 24-hr supervision arranged Equipment/Devices: None new Discharge Condition: Stable CODE STATUS: DNR Diet recommendation: Heart healthy, carb-modified  Brief/Interim Summary: Ross Morgan is an 86 y.o. male with a history of chronic HFrEF, stage IIIb CKD, AFib, T2DM, HTN, HLD, CVA, ITP and BPH who presented from [facility] with progressively worsening leg and scrotal edema despite changing furosemide to torsemide and metolazone which caused AKI. On presentation he was grossly volume overloaded with clear lungs. Lasix has been given with improved symptoms, though he remains overloaded. Cardiology was consulted for further recommendations. With continued lasix administration, volume overload improved greatly.   Discharge Diagnoses:  Principal Problem:   Acute on chronic systolic CHF (congestive heart failure) (HCC) Active Problems:   Essential hypertension   Hyperlipidemia   Bradycardia, sinus   Atrial fibrillation, slow VR and pauses   Type II diabetes mellitus (HCC)   Stroke (cerebrum) (HCC)   BPH (benign prostatic hyperplasia)   Diabetes mellitus with complication (HCC)   Bilateral lower extremity edema   Chronic ITP (idiopathic thrombocytopenia)  (HCC)   Chronic anticoagulation   HOH (hard of hearing)   CRI (chronic renal insufficiency), stage 3 (moderate)   Scrotal edema   Heart failure (HCC)  Acute on chronic combined and biventricular CHF with valvular heart disease: Predominantly right-sided symptoms with clear pulmonary exam. Echo March 2021 w/LVEF 40%, global hypokinesis, indeterminate diastolic function, RV function reduced with elevated PASP, severe biatrial enlargement, mild-mod MR, severe TR, mild AS and mild-mod AR.  - Appreciate cardiology consultation.  Medication Recommendations:  Plan to resume his Olmesartan at the 7/13 f/u visit if BP allows - carvedilol 3.125 mg twice daily - torsemide 40 mg twice daily (AM and early PM) - KCl 20 mEq twice daily - atorvastatin 20 mg daily - dabigatran 150 mg twice daily Other recommendations (labs, testing, etc):  Recheck BMET next week. Meticulous daily weight monitoring. Call for weight gain > 3lb in 24h or more than 5 lb from today's weight (as recorded on arrival on his home scale) Follow up as an outpatient:  7/13 8/13, [facility]  AKI on stage IIIb CKD: continues improvement with diuresis.  - Monitor renal function closely with aggressive diuresis. Anticipate continued improvement as renovascular congestion is relieved.  - Avoid NSAIDs  Left leg pain: Exam reassuring with point tenderness overlaying lateral upper leg without visible or palpable abnormality. Possibly some spasm of IT band which may be related to hemiparesis following stroke. Muscle cramping due to aggressive diuresis also possible. DVT previously ruled out by U/S.  - Heating pad, OOB as this helps pain.  - Antispasmodic may be of benefit, though will try other treatments before using potentially sedating medications.  - Check XR to r/o hip pathology with distally referred pain.   Paroxysmal atrial fibrillation, history of symptomatic bradycardia s/p  PPM 2015 [employer] DDD permanent pacemaker  (serial [id number]) inserted:  - Continue coreg and dabigatran  T2DM:  - Continue SSI  Anemia of chronic disease and ITP: - Platelet count improving significantly s/p IVIG.   HLD:  - Continue statin  HTN:  - Continue BP medications as above.   History of CVA: Eliquis changed to pradaxa following [facility] admission. - Continue anticoagulation and statin  BPH: No evidence of urinary retention at this time.  - Continue flomax, condom catheter.   Discharge Instructions Discharge Instructions    (HEART FAILURE PATIENTS) Call MD:  Anytime you have any of the following symptoms: 1) 3 pound weight gain in 24 hours or 5 pounds in 1 week 2) shortness of breath, with or without a dry hacking cough 3) swelling in the hands, feet or stomach 4) if you have to sleep on extra pillows at night in order to breathe.   Complete by: As directed    Call MD for:  difficulty breathing, headache or visual disturbances   Complete by: As directed    Call MD for:  persistant nausea and vomiting   Complete by: As directed    Call MD for:  temperature >100.4   Complete by: As directed    Diet - low sodium heart healthy   Complete by: As directed    Discharge instructions   Complete by: As directed    Continue medications as you were with the following exceptions:  - Increase torsemide to 40mg  by mouth twice daily - Decrease janumet to HALF a tablet twice daily (due to kidney impairment, this is the maximum recommended dose)  - Follow up with your PCP and continue PT for leg pain and improving mobility. - Follow up has been scheduled for your with cardiology on Tuesday. - If your swelling worsens or you have decreased urine output or inability to take fluids, seek medical attention right away.   Increase activity slowly   Complete by: As directed      Allergies as of 05/19/2020      Reactions   Hctz [hydrochlorothiazide] Other (See Comments)   Severe reaction   Zetia [ezetimibe] Other (See Comments)    LONG TIME AGO      Medication List    TAKE these medications   Alka-Seltzer Heartburn + Gas 750-80 MG Chew Generic drug: Calcium Carbonate-Simethicone Chew 1-2 each by mouth at bedtime.   atorvastatin 20 MG tablet Commonly known as: LIPITOR Take 20 mg by mouth at bedtime.   carvedilol 3.125 MG tablet Commonly known as: COREG Take 3.125 mg by mouth in the morning and at bedtime.   Co Q-10 200 MG Caps Take 1 capsule by mouth daily.   dabigatran 150 MG Caps capsule Commonly known as: PRADAXA Take 150 mg by mouth 2 (two) times daily.   LUBRICANT EYE DROPS OP Apply 1 drop to eye daily as needed (dry eye).   multivitamin capsule Take 1 capsule by mouth daily.   olmesartan 40 MG tablet Commonly known as: BENICAR Take 40 mg by mouth daily.   potassium chloride SA 20 MEQ tablet Commonly known as: KLOR-CON Take 20 mEq by mouth daily.   RECTICARE EX Apply 1 application topically daily as needed (rectal pain).   sitaGLIPtin-metformin 50-1000 MG tablet Commonly known as: JANUMET Take 0.5 tablets by mouth 2 (two) times daily with a meal. What changed: how much to take   SUPER B COMPLEX/VITAMIN C PO Take 1 tablet by mouth 2 (two) times  daily.   tamsulosin 0.4 MG Caps capsule Commonly known as: FLOMAX Take 0.4 mg by mouth at bedtime.   torsemide 20 MG tablet Commonly known as: DEMADEX Take 2 tablets (40 mg total) by mouth 2 (two) times daily. What changed: how much to take   venlafaxine 37.5 MG tablet Commonly known as: EFFEXOR Take 37.5 mg by mouth daily.   Vitamin D3 25 MCG (1000 UT) Caps Take 1 capsule by mouth 2 (two) times daily.       Follow-up Information    Meng, Hao, [place] Follow up on 05/23/2020.   Specialties: Cardiology, Radiology Why: Please arrive 15 minutes early for your 10:15aAzalee Coursem post-hospital cardiology follow-up appointment.  Contact information: [address] [place] [address] [phone]        Martha ClanShaw, William, MD.  Schedule an appointment as soon as possible for a visit.   Specialty: Internal Medicine Contact information: [address] [place] [address] [phone]              Allergies  Allergen Reactions  . Hctz [Hydrochlorothiazide] Other (See Comments)    Severe reaction  . Zetia [Ezetimibe] Other (See Comments)    LONG TIME AGO    Consultations:  Cardiology  Procedures/Studies: DG HIP UNILAT WITH PELVIS 2-3 VIEWS LEFT  Result Date: 05/18/2020 CLINICAL DATA:  Left hip pain history of multiple fall EXAM: DG HIP (WITH OR WITHOUT PELVIS) 2-3V LEFT COMPARISON:  None. FINDINGS: There is no evidence of hip fracture or dislocation. There is no evidence of arthropathy or other focal bone abnormality. IMPRESSION: Negative. Electronically Signed   By: Jasmine PangKim  Fujinaga M.D.   On: 05/18/2020 15:44     Subjective: Feels well, more alert, less sluggish. Swelling is significantly improved.   Discharge Exam: Vitals:   05/18/20 2152 05/19/20 0559  BP: 138/62 (!) 138/50  Pulse: 64 71  Resp:  (!) 23  Temp: 98.2 F (36.8 C) 98.9 F (37.2 C)  SpO2: 94% (!) 87%   General: Pt is alert, awake, not in acute distress Cardiovascular: RRR, S1/S2 +, no rubs, no gallops Respiratory: CTA bilaterally, no wheezing, no rhonchi Abdominal: Soft, NT, ND, bowel sounds + Extremities: 1+, much improved edema, no cyanosis  Labs: BNP (last 3 results) No results for input(s): BNP in the last 8760 hours. Basic Metabolic Panel: Recent Labs  Lab 05/15/20 0652 05/16/20 0316 05/17/20 0420 05/18/20 0431 05/19/20 0412  NA 135 134* 133* 135 136  K 4.0 3.9 3.6 3.5 4.2  CL 97* 94* 91* 92* 94*  CO2 26 26 26  32 30  GLUCOSE 129* 125* 136* 153* 157*  BUN 43* 44* 38* 38* 34*  CREATININE 1.70* 1.52* 1.48* 1.42* 1.47*  CALCIUM 8.9 8.9 9.0 8.8* 8.7*  PHOS  --   --   --  3.5 3.2   Liver Function Tests: Recent Labs  Lab 05/16/20 0316 05/18/20 0431 05/19/20 0412  AST 30  --   --   ALT 20  --   --    ALKPHOS 119  --   --   BILITOT 1.5*  --   --   PROT 7.5  --   --   ALBUMIN 3.1* 3.1* 2.9*   No results for input(s): LIPASE, AMYLASE in the last 168 hours. No results for input(s): AMMONIA in the last 168 hours. CBC: Recent Labs  Lab 05/15/20 0652 05/18/20 0431  WBC 5.9 5.9  NEUTROABS 4.0  --   HGB 9.2* 9.3*  HCT 29.4* 28.7*  MCV  103.2* 100.3*  PLT 116* 137*   Cardiac Enzymes: No results for input(s): CKTOTAL, CKMB, CKMBINDEX, TROPONINI in the last 168 hours. BNP: Invalid input(s): POCBNP CBG: Recent Labs  Lab 05/18/20 0721 05/18/20 1149 05/18/20 1623 05/18/20 2155 05/19/20 0715  GLUCAP 122* 269* 113* 260* 154*   D-Dimer No results for input(s): DDIMER in the last 72 hours. Hgb A1c No results for input(s): HGBA1C in the last 72 hours. Lipid Profile No results for input(s): CHOL, HDL, LDLCALC, TRIG, CHOLHDL, LDLDIRECT in the last 72 hours. Thyroid function studies No results for input(s): TSH, T4TOTAL, T3FREE, THYROIDAB in the last 72 hours.  Invalid input(s): FREET3 Anemia work up No results for input(s): VITAMINB12, FOLATE, FERRITIN, TIBC, IRON, RETICCTPCT in the last 72 hours. Urinalysis    Component Value Date/Time   COLORURINE YELLOW 05/15/2020 1019   APPEARANCEUR CLEAR 05/15/2020 1019   LABSPEC 1.008 05/15/2020 1019   PHURINE 6.0 05/15/2020 1019   GLUCOSEU NEGATIVE 05/15/2020 1019   HGBUR NEGATIVE 05/15/2020 1019   BILIRUBINUR NEGATIVE 05/15/2020 1019   KETONESUR NEGATIVE 05/15/2020 1019   PROTEINUR NEGATIVE 05/15/2020 1019   UROBILINOGEN 0.2 04/22/2008 1535   NITRITE NEGATIVE 05/15/2020 1019   LEUKOCYTESUR NEGATIVE 05/15/2020 1019    Microbiology Recent Results (from the past 240 hour(s))  SARS Coronavirus 2 by RT PCR (hospital order, performed in [facility] hospital lab) Nasopharyngeal Nasopharyngeal Swab     Status: None   Collection Time: 05/15/20  7:47 AM   Specimen: Nasopharyngeal Swab  Result Value Ref Range Status   SARS Coronavirus  2 NEGATIVE NEGATIVE Final    Comment: (NOTE) SARS-CoV-2 target nucleic acids are NOT DETECTED.  The SARS-CoV-2 RNA is generally detectable in upper and lower respiratory specimens during the acute phase of infection. The lowest concentration of SARS-CoV-2 viral copies this assay can detect is 250 copies / mL. A negative result does not preclude SARS-CoV-2 infection and should not be used as the sole basis for treatment or other patient management decisions.  A negative result may occur with improper specimen collection / handling, submission of specimen other than nasopharyngeal swab, presence of viral mutation(s) within the areas targeted by this assay, and inadequate number of viral copies (<250 copies / mL). A negative result must be combined with clinical observations, patient history, and epidemiological information.  Fact Sheet for Patients:   BoilerBrush.com.cy  Fact Sheet for Healthcare Providers: https://pope.com/  This test is not yet approved or  cleared by the [place] FDA and has been authorized for detection and/or diagnosis of SARS-CoV-2 by FDA under an Emergency Use Authorization (EUA).  This EUA will remain in effect (meaning this test can be used) for the duration of the COVID-19 declaration under Section 564(b)(1) of the Act, 21 U.S.C. section 360bbb-3(b)(1), unless the authorization is terminated or revoked sooner.  Performed at [facility], [address], [place], [place] [address]     Time coordinating discharge: Approximately 40 minutes  Tyrone Nine, MD  [facility] 05/19/2020, 10:27 AM

## 2020-05-19 NOTE — Plan of Care (Signed)
Resolved

## 2020-05-19 NOTE — Progress Notes (Addendum)
Progress Note  Patient Name: Ross Morgan Date of Encounter: 05/19/2020  Primary Cardiologist: Nanetta Batty, MD   Subjective   Primary complaints today are continue right leg pain and constipation. Swelling has improved significantly. No complaints of chest pain, SOB, or palpitations  Inpatient Medications    Scheduled Meds: . atorvastatin  20 mg Oral QHS  . carvedilol  3.125 mg Oral BID WC  . dabigatran  150 mg Oral BID  . insulin aspart  0-15 Units Subcutaneous TID WC  . polyethylene glycol  17 g Oral Daily  . senna-docusate  1 tablet Oral QHS  . sodium chloride flush  3 mL Intravenous Q12H  . tamsulosin  0.4 mg Oral Daily  . torsemide  40 mg Oral BID  . venlafaxine XR  37.5 mg Oral Q breakfast   Continuous Infusions: . sodium chloride     PRN Meds: sodium chloride, acetaminophen, metoprolol tartrate, oxyCODONE-acetaminophen, sodium chloride flush   Vital Signs    Vitals:   05/18/20 1153 05/18/20 1705 05/18/20 2152 05/19/20 0559  BP:  (!) 98/48 138/62 (!) 138/50  Pulse:   64 71  Resp:    (!) 23  Temp:   98.2 F (36.8 C) 98.9 F (37.2 C)  TempSrc:   Oral Oral  SpO2:   94% (!) 87%  Weight: 73.5 kg     Height:        Intake/Output Summary (Last 24 hours) at 05/19/2020 0839 Last data filed at 05/19/2020 0600 Gross per 24 hour  Intake 540 ml  Output 1740 ml  Net -1200 ml   Filed Weights   05/16/20 0500 05/17/20 0500 05/18/20 1153  Weight: 79.5 kg 71.5 kg 73.5 kg    Telemetry    V-paced - Personally Reviewed  ECG    No new tracings - Personally Reviewed  Physical Exam   GEN: No acute distress.   Neck: No JVD, no carotid bruits Cardiac: RRR, +murmur, no rubs or gallops.  Respiratory: Clear to auscultation bilaterally, no wheezes/ rales/ rhonchi GI: NABS, Soft, nontender, non-distended  MS: trace LE edema; No deformity. TTP of right thigh Neuro:  Nonfocal, moving all extremities spontaneously Psych: Normal affect   Labs    Chemistry Recent  Labs  Lab 05/16/20 0316 05/16/20 0316 05/17/20 0420 05/18/20 0431 05/19/20 0412  NA 134*   < > 133* 135 136  K 3.9   < > 3.6 3.5 4.2  CL 94*   < > 91* 92* 94*  CO2 26   < > 26 32 30  GLUCOSE 125*   < > 136* 153* 157*  BUN 44*   < > 38* 38* 34*  CREATININE 1.52*   < > 1.48* 1.42* 1.47*  CALCIUM 8.9   < > 9.0 8.8* 8.7*  PROT 7.5  --   --   --   --   ALBUMIN 3.1*  --   --  3.1* 2.9*  AST 30  --   --   --   --   ALT 20  --   --   --   --   ALKPHOS 119  --   --   --   --   BILITOT 1.5*  --   --   --   --   GFRNONAA 40*   < > 41* 43* 42*  GFRAA 46*   < > 48* 50* 48*  ANIONGAP 14   < > 16* 11 12   < > = values in this  interval not displayed.     Hematology Recent Labs  Lab 05/15/20 0652 05/18/20 0431  WBC 5.9 5.9  RBC 2.85* 2.86*  HGB 9.2* 9.3*  HCT 29.4* 28.7*  MCV 103.2* 100.3*  MCH 32.3 32.5  MCHC 31.3 32.4  RDW 14.9 14.6  PLT 116* 137*    Cardiac EnzymesNo results for input(s): TROPONINI in the last 168 hours. No results for input(s): TROPIPOC in the last 168 hours.   BNPNo results for input(s): BNP, PROBNP in the last 168 hours.   DDimer No results for input(s): DDIMER in the last 168 hours.   Radiology    DG HIP UNILAT WITH PELVIS 2-3 VIEWS LEFT  Result Date: 05/18/2020 CLINICAL DATA:  Left hip pain history of multiple fall EXAM: DG HIP (WITH OR WITHOUT PELVIS) 2-3V LEFT COMPARISON:  None. FINDINGS: There is no evidence of hip fracture or dislocation. There is no evidence of arthropathy or other focal bone abnormality. IMPRESSION: Negative. Electronically Signed   By: Jasmine Pang M.D.   On: 05/18/2020 15:44    Cardiac Studies   Echocardiogram 01/2020: 1. Left ventricular ejection fraction, by estimation, is 40%%. The left  ventricle has moderately decreased function. The left ventricle  demonstrates global hypokinesis. The left ventricular internal cavity size  was mildly to moderately dilated. Left  ventricular diastolic parameters are indeterminate.    2. Right ventricular systolic function is mildly reduced. The right  ventricular size is normal. mildly increased right ventricular wall  thickness. There is moderately elevated pulmonary artery systolic  pressure.  3. Left atrial size was severely dilated.  4. Right atrial size was severely dilated.  5. The mitral valve is normal in structure. Mild to moderate mitral valve  regurgitation.  6. Tricuspid valve regurgitation is severe.  7. AV is thickened, calcified with restricted motion. Peak and mean  gradients through the AV ae 18 and 10 mm Hg respecitvely. AVA calculated  at 1.8 cm2. LVO/AV VTI ratio is 0.53 consistent with mild AS.[name] Aortic  valve regurgitation is mild to moderate.  8. The inferior vena cava is dilated in size with <50% respiratory  variability, suggesting right atrial pressure of 15 mmHg.   Comparison(s): The left ventricular function is worsened. 11/20/15 EF  45-50%.   Patient Profile     86 y.o.malewith a PMH of chronic combined CHF, paroxysmal atrial fibrillation on pradaxa, symptomatic bradycardia s/p PPM, moderate mitral regurgitation, HTN, HLD, DM type 2, CKD stage 3, and TIA,who is being followed by cardiology for the evaluation of acute on chronic CHF  Assessment & Plan    1. Acute on chronic combined MEQ:ASTMHDQ presented with significant scrotal edema and LE edema.He wasstarted on IV lasix 60mg  TID>BID with transition to po torsemide 40mg  BID yesterday evening. He is diuresing well withUOP is net -1.2L in the past 24 hours and -8.8L this admission. Weight was 162lbs yesterday, no trend for today yet; down from 175lbs 7/6. Cr stable at 1.47 today. Perhaps recent IVIG triggered exacerbation. Echo 01/2020 showed EF 40% (down from 45-50% in 2017). Home irbesartan is on hold to allow room in Cr for diuresis. LE edema has improved significantly this admission. - Continue torsemide 40mg  BID - Continue compression stockings.  - Continue carvedilol -  Hopeful to restart home ARB at discharge  - Continue to monitor strict I&Os and daily weights - Continue to monitor electrolytes closely and replete to maintain K >4, Mg >2 - will give K 60 mEq today - Importance of a low  sodium diet and monitoring of daily weights at home was reviewed. - Will arrange outpatient follow-up for next week for close monitoring  2. Paroxysmal atrial fibrillation:V paced rhythm on telemetry. No complaints of palpitations. No complaints of bleeding on pradaxa. Rates well controlled - Continue pradaxa for stroke ppx - Continue carvedilol for rate control  3. Valvulopathy:moderate MR and severe TR on echo 01/2020. Felt to be a poor candidate for repair per Dr. Allyson Sabal. Likely contributing to #1.  - Continue diuresis as above.   4. HTN:BP stable - Continue carvedilol and lasix  5. HLD:No recent lipids - Continue atorvastatin  6. DM type 2:A1C 7.4 - Continue management per primary team  7. CKD stage 3:Cr stable at 1.47 today from 1.7 on admission. Home irbesartan on hold to allow room for diuresis - Continue to monitor closely  8. Anemia/thrombocytopenia:recently underwent IVIG for management of ITP. Hgb/PLT's stable this admission. No complaints of bleeding - Continue to monitor closely  9. Constipation: no recent BM despite daily miralax and addition of stool softeners yesterday.  - Will add suppository today.   10. Right thigh pain: unclear etiology. XR negative for acute findings. Unlikely DVT given pradaxa use.  - Will defer further work-up to primary team.   For questions or updates, please contact [facility] Please consult [email or web address] for contact info under Cardiology/STEMI.      Signed, Beatriz Stallion, PA-C  05/19/2020, 8:39 AM   [phone]  I have seen and examined the patient along with Beatriz Stallion, PA-C .  I have reviewed the chart, notes and new data.  I agree with PA/NP's note.  Key new complaints: markedly  improved edema, denies angina or dyspnea Key examination changes: trace ankle swelling. No weight today. BP occasionally low normal range Key new findings / data: creat 1.47 probably baseline  PLAN: [facility] will sign off.   Medication Recommendations:  Plan to resume his Olmesartan at the 7/13 f/u visit if BP allows - carvedilol 3.125 mg twice daily - torsemide 40 mg twice daily (AM and early PM) - KCl 20 mEq twice daily - atorvastatin 20 mg daily - dabigatran 150 mg twice daily Other recommendations (labs, testing, etc):  Recheck BMET next week. Meticulous daily weight monitoring. Call for weight gain > 3lb in 24h or more than 5 lb from today's weight (as recorded on arrival on his home scale) Follow up as an outpatient:  7/13 Azalee Course, [facility] office   Thurmon Fair, MD, [facility] [phone] 05/19/2020, 11:51 AM

## 2020-05-22 ENCOUNTER — Other Ambulatory Visit: Payer: Self-pay | Admitting: Oncology

## 2020-05-22 NOTE — Progress Notes (Unsigned)
Mr. Barbara Cower called today and contacted our scheduler Ms. Harle Stanford requesting to cancel all appointments here including all lab appointments.

## 2020-05-22 NOTE — Telephone Encounter (Signed)
Patient contacted regarding discharge from [facility] on 05/19/20.  Patient understands to follow up with provider Azalee Course, PA on 05/23/20 at 10:15 am  at [facility]. Patient understands discharge instructions? yes Patient understands medications and regiment? yes Patient understands to bring all medications to this visit? yes

## 2020-05-23 ENCOUNTER — Ambulatory Visit (INDEPENDENT_AMBULATORY_CARE_PROVIDER_SITE_OTHER): Payer: Medicare PPO | Admitting: Physician Assistant

## 2020-05-23 ENCOUNTER — Other Ambulatory Visit: Payer: Self-pay

## 2020-05-23 ENCOUNTER — Telehealth: Payer: Self-pay | Admitting: Physician Assistant

## 2020-05-23 ENCOUNTER — Encounter: Payer: Self-pay | Admitting: Physician Assistant

## 2020-05-23 VITALS — BP 107/51 | HR 64 | Temp 97.7°F | Ht 70.0 in | Wt 156.6 lb

## 2020-05-23 DIAGNOSIS — E119 Type 2 diabetes mellitus without complications: Secondary | ICD-10-CM | POA: Diagnosis not present

## 2020-05-23 DIAGNOSIS — I480 Paroxysmal atrial fibrillation: Secondary | ICD-10-CM | POA: Diagnosis not present

## 2020-05-23 DIAGNOSIS — Z950 Presence of cardiac pacemaker: Secondary | ICD-10-CM

## 2020-05-23 DIAGNOSIS — N183 Chronic kidney disease, stage 3 unspecified: Secondary | ICD-10-CM | POA: Diagnosis not present

## 2020-05-23 DIAGNOSIS — I5022 Chronic systolic (congestive) heart failure: Secondary | ICD-10-CM | POA: Diagnosis not present

## 2020-05-23 DIAGNOSIS — Z79899 Other long term (current) drug therapy: Secondary | ICD-10-CM

## 2020-05-23 DIAGNOSIS — E785 Hyperlipidemia, unspecified: Secondary | ICD-10-CM

## 2020-05-23 DIAGNOSIS — I10 Essential (primary) hypertension: Secondary | ICD-10-CM

## 2020-05-23 DIAGNOSIS — Z8673 Personal history of transient ischemic attack (TIA), and cerebral infarction without residual deficits: Secondary | ICD-10-CM

## 2020-05-23 LAB — BASIC METABOLIC PANEL
BUN/Creatinine Ratio: 24 (ref 10–24)
BUN: 43 mg/dL — ABNORMAL HIGH (ref 8–27)
CO2: 28 mmol/L (ref 20–29)
Calcium: 9.2 mg/dL (ref 8.6–10.2)
Chloride: 93 mmol/L — ABNORMAL LOW (ref 96–106)
Creatinine, Ser: 1.79 mg/dL — ABNORMAL HIGH (ref 0.76–1.27)
GFR calc Af Amer: 38 mL/min/{1.73_m2} — ABNORMAL LOW (ref >59)
GFR calc non Af Amer: 33 mL/min/{1.73_m2} — ABNORMAL LOW (ref >59)
Glucose: 151 mg/dL — ABNORMAL HIGH (ref 65–99)
Potassium: 4.2 mmol/L (ref 3.5–5.2)
Sodium: 134 mmol/L (ref 134–144)

## 2020-05-23 NOTE — Telephone Encounter (Signed)
Called nurse at [facility]-  Gave instructions from visit today. No other questions or concerns.

## 2020-05-23 NOTE — Telephone Encounter (Signed)
New Message  Herbert Seta from [facility] is calling for clarification on patients Potassium   Please call

## 2020-05-23 NOTE — Patient Instructions (Addendum)
Medication Instructions:   INCREASE Potassium 20 meq to 2 times daily  *If you need a refill on your cardiac medications before your next appointment, please call your pharmacy*  Lab Work: Your physician recommends that you return for lab work TODAY and again in 2 WEEKS- Can be drawn by the facility in 2 weeks fax results to our office [phone] Attn Azalee Course, PA-C/Hosteen Kienast A., CMA:  BMET  If you have labs (blood work) drawn today and your tests are completely normal, you will receive your results only by: [name] MyChart Message (if you have MyChart) OR . A paper copy in the mail If you have any lab test that is abnormal or we need to change your treatment, we will call you to review the results.  Testing/Procedures: NONE ordered at this time of appointment   Follow-Up: At [facility], you and your health needs are our priority.  As part of our continuing mission to provide you with exceptional heart care, we have created designated Provider Care Teams.  These Care Teams include your primary Cardiologist (physician) and Advanced Practice Providers (APPs -  Physician Assistants and Nurse Practitioners) who all work together to provide you with the care you need, when you need it.  Your next appointment:   AS SCHEDULED-07/13/2020 @ 3:15 PM   The format for your next appointment:   In Person  Provider:   Corine Shelter, PA-C  Other Instructions

## 2020-05-23 NOTE — Progress Notes (Signed)
Cardiology Office Note:    Date:  05/25/2020   ID:  Ross Morgan, DOB 1927/11/17, MRN [id number]  PCP:  Ross Clan, MD  [facility] Cardiologist:  Ross Batty, MD  [facility] Electrophysiologist:  None   Referring MD: Ross Clan, MD   Chief Complaint  Patient presents with  . Follow-up    History of Present Illness:    Ross Morgan is a 86 y.o. male with a hx of hypertension, hyperlipidemia, PAF on Pradaxa, CKD stage III, symptomatic bradycardia s/p PPM in July 2015, DM2 and history of CVA.  Echocardiogram performed in June 2015 was normal.  EF dropped to 45 to 50% by repeat echocardiogram in 2017.  Most recent echocardiogram obtained in March 2021 showed EF 40% with mild to moderate MR and severe TR.  When she was seen back in the cardiology office, he had orthopnea and 3+ pitting edema despite doubling of his torsemide.  He was started on Zaroxolyn which resulted in deterioration of his renal function.  Lasix was later switched to torsemide.  More recently, patient was admitted in early July with acute on chronic combined CHF.  When he went to the [facility], ED, he had severe scrotal edema which made it very uncomfortable for him to sit.  His admission weight was 175 pounds.  He tolerated IV diuresis well.  He was eventually discharged with dry weight of 162 pounds.  He was discharged on torsemide 40 mg twice daily.  Patient presents to cardiology office accompanied by his son-in-law.  He is extremely hard of hearing and he require his son-in-law to keep track of what is being said in the cardiology office.  Since discharge, he is doing well without significant lower extremity edema, orthopnea or PND.  He gets around with a wheelchair and occasionally walks around as well however walking is extremely difficult for him.  He appears to be euvolemic on exam although his weight is actually up 1 pound lower than his discharge dry weight.  I recommend a basic metabolic panel to  make sure he is not been dehydrated.  He currently stays at [facility] independent living facility.  For some reason instead of twice a day potassium chloride supplement, he was only discharged on daily dosing instead.  I recommended resuming twice a day dosing given the high dose of diuretic he is taking.  He will need a basic metabolic panel today and also in 2 weeks to keep track of the renal function and the electrolyte.  Otherwise he has a follow-up in 6 weeks with Ross Morgan.  Past Medical History:  Diagnosis Date  . Acute on chronic systolic CHF (congestive heart failure) (HCC) 05/16/2020  . Anal fissure   . Arthritis    "left pointer" (05/30/2014)  . Atrial fibrillation (HCC)   . Bradycardia   . Depression   . Hyperlipidemia   . Hypertension   . Knee pain 2015   "tore tendon left knee; no OR"  . On continuous oral anticoagulation   . Stroke (HCC)   . Symptomatic bradycardia    status post dual AV permanent transvenous pacemaker insertion  . Type II diabetes mellitus (HCC)     Past Surgical History:  Procedure Laterality Date  . CATARACT EXTRACTION W/ INTRAOCULAR LENS  IMPLANT, BILATERAL Bilateral 2000's  . PACEMAKER INSERTION  05/31/14   STJ dual chamber pacemaker implanted by Dr Ladona Ridgel for symptomatic bradycardia  . PERMANENT PACEMAKER INSERTION N/A 05/31/2014   Procedure: PERMANENT PACEMAKER INSERTION;  Surgeon:  Marinus Maw, MD;  Location: [facility] CATH LAB;  Service: Cardiovascular;  Laterality: N/A;    Current Medications: Current Meds  Medication Sig  . atorvastatin (LIPITOR) 20 MG tablet Take 20 mg by mouth at bedtime.  . B Complex-C (SUPER B COMPLEX/VITAMIN C PO) Take 1 tablet by mouth 2 (two) times daily.  . Calcium Carbonate-Simethicone (ALKA-SELTZER HEARTBURN + GAS) 750-80 MG CHEW Chew 1-2 each by mouth at bedtime.  . Carboxymethylcellulose Sodium (LUBRICANT EYE DROPS OP) Apply 1 drop to eye daily as needed (dry eye).   . carvedilol (COREG) 3.125 MG tablet Take 3.125  mg by mouth in the morning and at bedtime.  . Cholecalciferol (VITAMIN D3) 25 MCG (1000 UT) CAPS Take 1 capsule by mouth 2 (two) times daily.  . Coenzyme Q10 (CO Q-10) 200 MG CAPS Take 1 capsule by mouth daily.   . cyclobenzaprine (FLEXERIL) 5 MG tablet Take 0.5-1 tablets (2.5-5 mg total) by mouth 3 (three) times daily as needed for muscle spasms.  . dabigatran (PRADAXA) 150 MG CAPS capsule Take 150 mg by mouth 2 (two) times daily.  . Lidocaine, Anorectal, (RECTICARE EX) Apply 1 application topically daily as needed (rectal pain).  . Multiple Vitamin (MULTIVITAMIN) capsule Take 1 capsule by mouth daily.  [name] olmesartan (BENICAR) 40 MG tablet Take 40 mg by mouth daily.  . potassium chloride SA (KLOR-CON) 20 MEQ tablet Take 20 mEq by mouth 2 (two) times daily.   . sitaGLIPtin-metformin (JANUMET) 50-1000 MG tablet Take 0.5 tablets by mouth 2 (two) times daily with a meal.  . tamsulosin (FLOMAX) 0.4 MG CAPS capsule Take 0.4 mg by mouth at bedtime.   [name] venlafaxine (EFFEXOR) 37.5 MG tablet Take 37.5 mg by mouth daily.   . [DISCONTINUED] torsemide (DEMADEX) 20 MG tablet Take 2 tablets (40 mg total) by mouth 2 (two) times daily. (Patient taking differently: Take by mouth 2 (two) times daily. Take 40 mg in the AM and 20 mg in the PM)     Allergies:   Hctz [hydrochlorothiazide] and Zetia [ezetimibe]   Social History   Socioeconomic History  . Marital status: Married    Spouse name: Not on file  . Number of children: 2  . Years of education: Not on file  . Highest education level: Not on file  Occupational History  . Occupation: retired Biomedical scientist  Tobacco Use  . Smoking status: Former Smoker    Years: 1.00    Types: Cigarettes  . Smokeless tobacco: Never Used  . Tobacco comment: "quit smoking in ~ 1952  Vaping Use  . Vaping Use: Never used  Substance and Sexual Activity  . Alcohol use: Yes    Comment: 05/30/2014 "glass of wine ~ once/month"  . Drug use: No  . Sexual activity: Yes  Other  Topics Concern  . Not on file  Social History Narrative  . Not on file   Social Determinants of Health   Financial Resource Strain:   . Difficulty of Paying Living Expenses:   Food Insecurity:   . Worried About Programme researcher, broadcasting/film/video in the Last Year:   . Barista in the Last Year:   Transportation Needs:   . Freight forwarder (Medical):   [name] Lack of Transportation (Non-Medical):   Physical Activity:   . Days of Exercise per Week:   . Minutes of Exercise per Session:   Stress:   . Feeling of Stress :   Social Connections:   . Frequency of Communication with  Friends and Family:   . Frequency of Social Gatherings with Friends and Family:   . Attends Religious Services:   . Active Member of Clubs or Organizations:   . Attends Banker Meetings:   [name] Marital Status:      Family History: The patient's family history is negative for Heart attack.  ROS:   Please see the history of present illness.     All other systems reviewed and are negative.  EKGs/Labs/Other Studies Reviewed:    The following studies were reviewed today:  Echo 01/28/2020 1. Left ventricular ejection fraction, by estimation, is 40%%. The left  ventricle has moderately decreased function. The left ventricle  demonstrates global hypokinesis. The left ventricular internal cavity size  was mildly to moderately dilated. Left  ventricular diastolic parameters are indeterminate.  2. Right ventricular systolic function is mildly reduced. The right  ventricular size is normal. mildly increased right ventricular wall  thickness. There is moderately elevated pulmonary artery systolic  pressure.  3. Left atrial size was severely dilated.  4. Right atrial size was severely dilated.  5. The mitral valve is normal in structure. Mild to moderate mitral valve  regurgitation.  6. Tricuspid valve regurgitation is severe.  7. AV is thickened, calcified with restricted motion. Peak and mean    gradients through the AV ae 18 and 10 mm Hg respecitvely. AVA calculated  at 1.8 cm2. LVO/AV VTI ratio is 0.53 consistent with mild AS.[name] Aortic  valve regurgitation is mild to moderate.  8. The inferior vena cava is dilated in size with <50% respiratory  variability, suggesting right atrial pressure of 15 mmHg.   EKG:  EKG is not ordered today.   Recent Labs: 05/15/2020: TSH 1.363 05/16/2020: ALT 20 05/18/2020: Hemoglobin 9.3; Platelets 137 05/23/2020: BUN 43; Creatinine, Ser 1.79; Potassium 4.2; Sodium 134  Recent Lipid Panel    Component Value Date/Time   CHOL 109 11/20/2015 0632   TRIG 52 11/20/2015 0632   HDL 28 (L) 11/20/2015 0632   CHOLHDL 3.9 11/20/2015 0632   VLDL 10 11/20/2015 0632   LDLCALC 71 11/20/2015 0632    Physical Exam:    VS:  BP (!) 107/51   Pulse 64   Temp 97.7 F (36.5 C)   Ht 5\' 10"  (1.778 m)   Wt 156 lb 9.6 oz (71 kg)   SpO2 96%   BMI 22.47 kg/m     Wt Readings from Last 3 Encounters:  05/23/20 156 lb 9.6 oz (71 kg)  05/18/20 162 lb 0.6 oz (73.5 kg)  04/18/20 169 lb (76.7 kg)     GEN: Frail, sitting in wheelchair HEENT: Normal NECK: No JVD; No carotid bruits LYMPHATICS: No lymphadenopathy CARDIAC: RRR, no murmurs, rubs, gallops RESPIRATORY:  Clear to auscultation without rales, wheezing or rhonchi  ABDOMEN: Soft, non-tender, non-distended MUSCULOSKELETAL:  No edema; No deformity  SKIN: Warm and dry NEUROLOGIC:  Alert and oriented x 3 PSYCHIATRIC:  Normal affect   ASSESSMENT:    1. Chronic systolic heart failure (HCC)   2. Medication management   3. Essential hypertension   4. Hyperlipidemia LDL goal <70   5. PAF (paroxysmal atrial fibrillation) (HCC)   6. Stage 3 chronic kidney disease, unspecified whether stage 3a or 3b CKD   7. Pacemaker   8. Controlled type 2 diabetes mellitus without complication, without long-term current use of insulin (HCC)   9. H/O: CVA (cerebrovascular accident)    PLAN:    In order of problems listed  above:  1. Chronic systolic heart failure: He appears to be euvolemic on physical exam, however since discharge, he has managed to lose additional 1 pound.  He is currently on torsemide 40 mg twice a day.  I am concerned that he might be dehydrated.  Instead of taking 20 meq twice a day of potassium as instructed by cardiology service prior to discharge, he was discharged on 20 meq daily of potassium supplement.  I asked him to resume 20 meq twice daily dosing.  I recommended a basic metabolic panel today and again in 2 weeks.  2. Hypertension: Blood pressure stable  3. Hyperlipidemia: Continue Lipitor  4. PAF: On Pradaxa  5. CKD stage III: Obtain basic metabolic panel  6. History of pacemaker: Followed by EP service  7. DM2: Managed by primary care provider  8. History of CVA: No recent recurrence.   Medication Adjustments/Labs and Tests Ordered: Current medicines are reviewed at length with the patient today.  Concerns regarding medicines are outlined above.  Orders Placed This Encounter  Procedures  . Basic metabolic panel  . Basic metabolic panel   No orders of the defined types were placed in this encounter.   Patient Instructions  Medication Instructions:   INCREASE Potassium 20 meq to 2 times daily  *If you need a refill on your cardiac medications before your next appointment, please call your pharmacy*  Lab Work: Your physician recommends that you return for lab work TODAY and again in 2 WEEKS- Can be drawn by the facility in 2 weeks fax results to our office [phone] Attn Azalee CourseHao Tanaysha Alkins, PA-C/Terrah A., CMA:  BMET  If you have labs (blood work) drawn today and your tests are completely normal, you will receive your results only by: [name]. MyChart Message (if you have MyChart) OR . A paper copy in the mail If you have any lab test that is abnormal or we need to change your treatment, we will call you to review the results.  Testing/Procedures: NONE ordered at this  time of appointment   Follow-Up: [facility], you and your health needs are our priority.  As part of our continuing mission to provide you with exceptional heart care, we have created designated Provider Care Teams.  These Care Teams include your primary Cardiologist (physician) and Advanced Practice Providers (APPs -  Physician Assistants and Nurse Practitioners) who all work together to provide you with the care you need, when you need it.  Your next appointment:   AS SCHEDULED-07/13/2020 @ 3:15 PM   The format for your next appointment:   In Person  Provider:   Corine ShelterLuke Kilroy, PA-C  Other Instructions      Signed, Azalee CourseHao Fran Mcree, PA  05/25/2020 11:10 PM    [facility]

## 2020-05-24 ENCOUNTER — Encounter: Payer: Self-pay | Admitting: *Deleted

## 2020-05-24 ENCOUNTER — Other Ambulatory Visit: Payer: Self-pay

## 2020-05-24 DIAGNOSIS — Z79899 Other long term (current) drug therapy: Secondary | ICD-10-CM

## 2020-05-24 NOTE — Telephone Encounter (Addendum)
Unable to reach pt or leave a message    ----- Message from [place], [place] sent at 05/24/2020  1:38 PM EDT ----- Renal function worsened slightly which may suggest volume depletion, will reduce torsemide from the previous 40mg  BID to 40mg  AM/20mg  PM. No change to the potassium dose. Repeat BMET in 1 week

## 2020-05-24 NOTE — Progress Notes (Signed)
Updated medication list to reflex change to patient's torsemide.

## 2020-05-30 DIAGNOSIS — D649 Anemia, unspecified: Secondary | ICD-10-CM | POA: Diagnosis not present

## 2020-05-30 DIAGNOSIS — I480 Paroxysmal atrial fibrillation: Secondary | ICD-10-CM | POA: Diagnosis not present

## 2020-05-30 DIAGNOSIS — I5022 Chronic systolic (congestive) heart failure: Secondary | ICD-10-CM | POA: Diagnosis not present

## 2020-05-30 DIAGNOSIS — E1149 Type 2 diabetes mellitus with other diabetic neurological complication: Secondary | ICD-10-CM | POA: Diagnosis not present

## 2020-05-30 DIAGNOSIS — I50813 Acute on chronic right heart failure: Secondary | ICD-10-CM | POA: Diagnosis not present

## 2020-05-30 DIAGNOSIS — I10 Essential (primary) hypertension: Secondary | ICD-10-CM | POA: Diagnosis not present

## 2020-05-30 DIAGNOSIS — N1832 Chronic kidney disease, stage 3b: Secondary | ICD-10-CM | POA: Diagnosis not present

## 2020-05-30 DIAGNOSIS — E1129 Type 2 diabetes mellitus with other diabetic kidney complication: Secondary | ICD-10-CM | POA: Diagnosis not present

## 2020-05-30 NOTE — Telephone Encounter (Signed)
This encounter was created in error - please disregard.

## 2020-05-31 ENCOUNTER — Telehealth: Payer: Self-pay | Admitting: Physician Assistant

## 2020-05-31 NOTE — Telephone Encounter (Signed)
Returned call to Ross Morgan to inform her that we did receive the labs, Ross Morgan will review them and someone will get back with her. She thanked me for calling.

## 2020-05-31 NOTE — Telephone Encounter (Signed)
Cherly Hensen from [facility] calling stating she faxed over the patient's lab results yesterday and would like to know if they were received.

## 2020-06-02 NOTE — Telephone Encounter (Signed)
Cr 1.32, K 4.3. When compare to previous lab work, kidney function has significantly improved, now near Dr. Richardean Canal baseline, continue on current therapy

## 2020-06-05 DIAGNOSIS — R42 Dizziness and giddiness: Secondary | ICD-10-CM | POA: Diagnosis not present

## 2020-06-05 DIAGNOSIS — I69954 Hemiplegia and hemiparesis following unspecified cerebrovascular disease affecting left non-dominant side: Secondary | ICD-10-CM | POA: Diagnosis not present

## 2020-06-05 DIAGNOSIS — Z8673 Personal history of transient ischemic attack (TIA), and cerebral infarction without residual deficits: Secondary | ICD-10-CM | POA: Diagnosis not present

## 2020-06-05 DIAGNOSIS — R2689 Other abnormalities of gait and mobility: Secondary | ICD-10-CM | POA: Diagnosis not present

## 2020-06-05 DIAGNOSIS — R278 Other lack of coordination: Secondary | ICD-10-CM | POA: Diagnosis not present

## 2020-06-05 DIAGNOSIS — I5022 Chronic systolic (congestive) heart failure: Secondary | ICD-10-CM | POA: Diagnosis not present

## 2020-06-08 ENCOUNTER — Ambulatory Visit (INDEPENDENT_AMBULATORY_CARE_PROVIDER_SITE_OTHER): Payer: Medicare PPO | Admitting: *Deleted

## 2020-06-08 DIAGNOSIS — I495 Sick sinus syndrome: Secondary | ICD-10-CM | POA: Diagnosis not present

## 2020-06-08 LAB — CUP PACEART REMOTE DEVICE CHECK
Battery Remaining Longevity: 124 mo
Battery Remaining Percentage: 95.5 %
Battery Voltage: 2.99 V
Brady Statistic RV Percent Paced: 95 %
Date Time Interrogation Session: 20210729053419
Implantable Lead Implant Date: 20150721
Implantable Lead Implant Date: 20150721
Implantable Lead Location: 753859
Implantable Lead Location: 753860
Implantable Pulse Generator Implant Date: 20150721
Lead Channel Impedance Value: 460 Ohm
Lead Channel Pacing Threshold Amplitude: 1 V
Lead Channel Pacing Threshold Pulse Width: 0.4 ms
Lead Channel Sensing Intrinsic Amplitude: 9.5 mV
Lead Channel Setting Pacing Amplitude: 2.5 V
Lead Channel Setting Pacing Pulse Width: 0.4 ms
Lead Channel Setting Sensing Sensitivity: 6 mV
Pulse Gen Model: 2240
Pulse Gen Serial Number: 7643490

## 2020-06-12 NOTE — Progress Notes (Signed)
Remote pacemaker transmission.   

## 2020-06-13 NOTE — Telephone Encounter (Signed)
Myrla Halsted the nurse for Mr. Wedemeyer on 06/06/20 and informed her of the results and to continue current medication therapy. She thanked me for the call

## 2020-06-27 DIAGNOSIS — J343 Hypertrophy of nasal turbinates: Secondary | ICD-10-CM | POA: Diagnosis not present

## 2020-06-27 DIAGNOSIS — H6122 Impacted cerumen, left ear: Secondary | ICD-10-CM | POA: Diagnosis not present

## 2020-06-27 DIAGNOSIS — H903 Sensorineural hearing loss, bilateral: Secondary | ICD-10-CM | POA: Diagnosis not present

## 2020-06-27 DIAGNOSIS — H9212 Otorrhea, left ear: Secondary | ICD-10-CM | POA: Diagnosis not present

## 2020-06-27 DIAGNOSIS — H7291 Unspecified perforation of tympanic membrane, right ear: Secondary | ICD-10-CM | POA: Diagnosis not present

## 2020-06-27 DIAGNOSIS — Z9622 Myringotomy tube(s) status: Secondary | ICD-10-CM | POA: Diagnosis not present

## 2020-06-27 DIAGNOSIS — Z974 Presence of external hearing-aid: Secondary | ICD-10-CM | POA: Diagnosis not present

## 2020-06-27 DIAGNOSIS — B370 Candidal stomatitis: Secondary | ICD-10-CM | POA: Diagnosis not present

## 2020-07-01 ENCOUNTER — Telehealth: Payer: Self-pay | Admitting: Internal Medicine

## 2020-07-01 LAB — CBC AND DIFFERENTIAL
HCT: 40 — AB (ref 41–53)
Hemoglobin: 12.8 — AB (ref 13.5–17.5)
Platelets: 192 (ref 150–399)
WBC: 8.7

## 2020-07-01 LAB — BASIC METABOLIC PANEL
BUN: 72 — AB (ref 4–21)
CO2: 25 — AB (ref 13–22)
Chloride: 86 — AB (ref 99–108)
Creatinine: 2 — AB (ref 0.6–1.3)
Glucose: 817
Potassium: 4.1 (ref 3.4–5.3)
Sodium: 124 — AB (ref 137–147)

## 2020-07-01 LAB — CBC: RBC: 4 (ref 3.87–5.11)

## 2020-07-01 LAB — COMPREHENSIVE METABOLIC PANEL: Calcium: 9.5 (ref 8.7–10.7)

## 2020-07-01 NOTE — Telephone Encounter (Signed)
Got called for this Patient. He has not been seen by anyone in our Practice. Transferred to Acute Rehab from home with looks Like Change in Mental status. Not taking his Meds. Nurse did Accu check and his sugars were more then 600 Told to send him to hospital but his family said Patient wishes were to stay in facility and not be hooked to any IV or Machines. So he is Comfort Care.  Ordered Stat Labs. He is spitting his Meds. So Hold all his Meds. Vitals are stable. No Fever or cough. Has given him 10 units of Novolog. Will repeat Accuc check in 2 hours. It seems Acute change as he was seen by ENT recently at baseline. Probably will need Hospice

## 2020-07-02 NOTE — Telephone Encounter (Signed)
Thank you, I will check on him when I return to work Monday

## 2020-07-03 ENCOUNTER — Encounter: Payer: Self-pay | Admitting: Adult Health

## 2020-07-03 ENCOUNTER — Non-Acute Institutional Stay (SKILLED_NURSING_FACILITY): Payer: Medicare PPO | Admitting: Adult Health

## 2020-07-03 DIAGNOSIS — I5022 Chronic systolic (congestive) heart failure: Secondary | ICD-10-CM

## 2020-07-03 DIAGNOSIS — R410 Disorientation, unspecified: Secondary | ICD-10-CM

## 2020-07-03 DIAGNOSIS — E871 Hypo-osmolality and hyponatremia: Secondary | ICD-10-CM | POA: Diagnosis not present

## 2020-07-03 DIAGNOSIS — N179 Acute kidney failure, unspecified: Secondary | ICD-10-CM

## 2020-07-03 DIAGNOSIS — I480 Paroxysmal atrial fibrillation: Secondary | ICD-10-CM

## 2020-07-03 DIAGNOSIS — F339 Major depressive disorder, recurrent, unspecified: Secondary | ICD-10-CM

## 2020-07-03 DIAGNOSIS — Z7189 Other specified counseling: Secondary | ICD-10-CM

## 2020-07-03 DIAGNOSIS — E1100 Type 2 diabetes mellitus with hyperosmolarity without nonketotic hyperglycemic-hyperosmolar coma (NKHHC): Secondary | ICD-10-CM

## 2020-07-03 DIAGNOSIS — Z20828 Contact with and (suspected) exposure to other viral communicable diseases: Secondary | ICD-10-CM | POA: Diagnosis not present

## 2020-07-03 DIAGNOSIS — Z66 Do not resuscitate: Secondary | ICD-10-CM | POA: Diagnosis not present

## 2020-07-03 DIAGNOSIS — Z950 Presence of cardiac pacemaker: Secondary | ICD-10-CM | POA: Diagnosis not present

## 2020-07-03 DIAGNOSIS — Z7901 Long term (current) use of anticoagulants: Secondary | ICD-10-CM

## 2020-07-03 DIAGNOSIS — D693 Immune thrombocytopenic purpura: Secondary | ICD-10-CM

## 2020-07-03 DIAGNOSIS — N400 Enlarged prostate without lower urinary tract symptoms: Secondary | ICD-10-CM

## 2020-07-03 DIAGNOSIS — Z9189 Other specified personal risk factors, not elsewhere classified: Secondary | ICD-10-CM | POA: Diagnosis not present

## 2020-07-03 MED ORDER — LORAZEPAM 0.5 MG PO TABS: 0.5000 mg | ORAL_TABLET | ORAL | 1 refills | Status: DC | PRN

## 2020-07-03 NOTE — Telephone Encounter (Signed)
I will admit him tomorrow morning.

## 2020-07-03 NOTE — ACP (Advance Care Planning) (Signed)
[name] is an 86 y.o. male with a complex medical history. He has expressed wishes to past peacefully without aggressive intervention. His wife passed a few years ago due to PD with aspiration pna and received only comfort care. He has expressed wishes to pass away the same way to his family. I have talked with his daughter Ross Morgan. She does not want him to receive any life sustaining measures. She would like [place] only to intervene when he is uncomfortable. At this time he expresses that he is scared but not in pain. We are allowing his family to visit for compassionate reasons even though we are on "lockdown" due to covid 19 cases. His daughter Ross Morgan expressed appreciation for the care provided. We agreed to no hospitalizations, no IVF, and no tube feedings, and no treatment of infections or "root causes" of his problem. A most form was left a the desk and the nurse will ensure that it is signed later today for their visit.

## 2020-07-03 NOTE — Progress Notes (Addendum)
Location:  Medical illustrator of Service:  SNF (31) Provider:   Peggye Morgan, ANP [facility] [phone]   Ross Clan, MD  Patient Care Team: Ross Clan, MD as PCP - General (Internal Medicine) Ross Gess, MD as PCP - Cardiology (Cardiology) Morgan, Ross Hue, MD as Consulting Physician (Oncology)  Extended Emergency Contact Information Primary Emergency Contact: Morgan,Ross Address: [address]          [place], [place] [address] [place] Phone: [phone] Relation: Daughter Secondary Emergency Contact: Hettie Holstein [place] Mobile Phone: [phone] Relation: Son  Code Status:  DNR Goals of care: Advanced Directive information Advanced Directives 07/03/2020  Does Patient Have a Medical Advance Directive? Yes  Type of Estate agent of [place];Living will;Out of facility DNR (pink MOST or yellow form)  Does patient want to make changes to medical advance directive? -  Copy of Healthcare Power of Attorney in Chart? -  Would patient like information on creating a medical advance directive? -  Pre-existing out of facility DNR order (yellow form or pink MOST form) Yellow form placed in chart (order not valid for inpatient use)     Chief Complaint  Patient presents with  . Acute Visit    f/u hyperglycemia and weakness     HPI:  Pt is a 86 y.o. male seen today for f/u regarding hyperglycemia and weakness. [name] has a hx of depression, systolic CHF, afib, pacemaker, BPH, thrombocytopenia, DM II, HTN, CVA with left sided weakness, and HLD. He lived in independent living prior to his rehab admission. He used a WC most of the time but could ambulate short distances.   He was admitted on 06/30/20 due to weakness and dry heaving.He and his family declined a hospital admission.  He was seen by ENT on 8/17 and diagnosed with oral thrush and given nystatin and  ciprodex for inflammation of the ear canal. During that time he was not eating well and having trouble swallowing due to thrush. He stopped taking his meds Tuesday-Thursday of last week but on Friday his daughter made him take them. During this time he texted his children "I want to die".  He had been saying for awhile that he wanted to die. He has had a hard to with progressive health issues such as CHF (treated in July in the hospital), along with depression for several years after the death of his wife. His family denied a hx of dementia but its noted he did have med management provided by [facility].   Upon admission labs were drawn glucose was 817, NA 124, BUN 72, Cr 2.04, Anion gap 13, K 4.1.  He was treated with Novolog SSI. He was not eating at first but as of this morning ate 1/2 PB and J sandwich and was asking for hot chocolate. Over the weekend IV access was difficult to obtain but on 8/22 they established a line and he was given 2 liters of NS. He has not had any further feelings of nausea. No sob or increased edema. Weight is trending down (see below). Blood sugars are improved 300-400 range. He is making urine. He is having periods of delirium and took 0.5 mg of ativan on the night of 8/22.    Wt Readings from Last 3 Encounters:  07/03/20 143 lb 6.4 oz (65 kg)  05/23/20 156 lb 9.6 oz (71 kg)  05/18/20 162 lb 0.6 oz (73.5 kg)  He fell prior to admission and sustained a bruise to his left elbow with skin tear covered by a polymem and a bruise to his right thigh.   Past Medical History:  Diagnosis Date  . Acute on chronic systolic CHF (congestive heart failure) (HCC) 05/16/2020  . Anal fissure   . Arthritis    "left pointer" (05/30/2014)  . Atrial fibrillation (HCC)   . Bradycardia   . Depression   . Hyperlipidemia   . Hypertension   . Knee pain 2015   "tore tendon left knee; no OR"  . On continuous oral anticoagulation   . Stroke (HCC)   . Symptomatic bradycardia    status  post dual AV permanent transvenous pacemaker insertion  . Type II diabetes mellitus (HCC)    Past Surgical History:  Procedure Laterality Date  . CATARACT EXTRACTION W/ INTRAOCULAR LENS  IMPLANT, BILATERAL Bilateral 2000's  . PACEMAKER INSERTION  05/31/14   STJ dual chamber pacemaker implanted by Dr Ladona Ridgel for symptomatic bradycardia  . PERMANENT PACEMAKER INSERTION N/A 05/31/2014   Procedure: PERMANENT PACEMAKER INSERTION;  Surgeon: Marinus Maw, MD;  Location[facility] CATH LAB;  Service: Cardiovascular;  Laterality: N/A;    Allergies  Allergen Reactions  . Hctz [Hydrochlorothiazide] Other (See Comments)    Severe reaction  . Zetia [Ezetimibe] Other (See Comments)    LONG TIME AGO    Outpatient Encounter Medications as of 07/03/2020  Medication Sig  . insulin aspart (NOVOLOG) 100 UNIT/ML injection Inject 2-12 Units into the skin every 6 (six) hours. 151-200=2 units, 201-254=4 units, 255-300=6 units, 301-350=8 units, 351-400=10 units, 401-450=12 units  . LORazepam (ATIVAN) 0.5 MG tablet Take 1 tablet (0.5 mg total) by mouth every 4 (four) hours as needed for anxiety.  . [DISCONTINUED] LORazepam (ATIVAN) 0.5 MG tablet Take 0.5 mg by mouth every 4 (four) hours as needed for anxiety.  [name] atorvastatin (LIPITOR) 20 MG tablet Take 20 mg by mouth at bedtime.  . Carboxymethylcellulose Sodium (LUBRICANT EYE DROPS OP) Apply 1 drop to eye daily as needed (dry eye).   . carvedilol (COREG) 3.125 MG tablet Take 3.125 mg by mouth in the morning and at bedtime.  . dabigatran (PRADAXA) 150 MG CAPS capsule Take 150 mg by mouth 2 (two) times daily.  . Lidocaine, Anorectal, (RECTICARE EX) Apply 1 application topically daily as needed (rectal pain).  . tamsulosin (FLOMAX) 0.4 MG CAPS capsule Take 0.4 mg by mouth at bedtime.   [name] venlafaxine (EFFEXOR) 37.5 MG tablet Take 37.5 mg by mouth daily.   . [DISCONTINUED] B Complex-C (SUPER B COMPLEX/VITAMIN C PO) Take 1 tablet by mouth 2 (two) times daily.  .  [DISCONTINUED] Calcium Carbonate-Simethicone (ALKA-SELTZER HEARTBURN + GAS) 750-80 MG CHEW Chew 1-2 each by mouth at bedtime.  . [DISCONTINUED] Cholecalciferol (VITAMIN D3) 25 MCG (1000 UT) CAPS Take 1 capsule by mouth 2 (two) times daily.  . [DISCONTINUED] Coenzyme Q10 (CO Q-10) 200 MG CAPS Take 1 capsule by mouth daily.   . [DISCONTINUED] cyclobenzaprine (FLEXERIL) 5 MG tablet Take 0.5-1 tablets (2.5-5 mg total) by mouth 3 (three) times daily as needed for muscle spasms.  . [DISCONTINUED] Multiple Vitamin (MULTIVITAMIN) capsule Take 1 capsule by mouth daily.  . [DISCONTINUED] olmesartan (BENICAR) 40 MG tablet Take 40 mg by mouth daily.  . [DISCONTINUED] potassium chloride SA (KLOR-CON) 20 MEQ tablet Take 20 mEq by mouth 2 (two) times daily.   . [DISCONTINUED] sitaGLIPtin-metformin (JANUMET) 50-1000 MG tablet Take 0.5 tablets by mouth 2 (two) times daily with a  meal.  . [DISCONTINUED] torsemide (DEMADEX) 20 MG tablet Take by mouth daily. Take 40 mg in the AM and 20 mg in the PM   No facility-administered encounter medications on file as of 07/03/2020.    Review of Systems  Unable to perform ROS: Acuity of condition    Immunization History  Administered Date(s) Administered  . Moderna SARS-COVID-2 Vaccination 11/23/2019, 12/22/2019  . Pneumococcal Polysaccharide-23 01/10/2010, 02/25/2014  . Tdap 08/24/2019   Pertinent  Health Maintenance Due  Topic Date Due  . FOOT EXAM  Never done  . OPHTHALMOLOGY EXAM  Never done  . PNA vac Low Risk Adult (2 of 2 - PCV13) 02/26/2015  . INFLUENZA VACCINE  06/11/2020  . HEMOGLOBIN A1C  11/15/2020   Fall Risk  09/15/2017 09/04/2016  Falls in the past year? No No   Functional Status Survey:    Vitals:   07/03/20 0948  Weight: 143 lb 6.4 oz (65 kg)   Body mass index is 20.58 kg/m. Physical Exam Constitutional:      General: He is not in acute distress.    Appearance: He is not diaphoretic.     Comments: Frail and thin appearing  HENT:      Head: Normocephalic and atraumatic.     Nose: Nose normal. No congestion.     Mouth/Throat:     Mouth: Mucous membranes are dry.     Pharynx: Oropharynx is clear. No oropharyngeal exudate.     Comments: No thrush noted Eyes:     General:        Right eye: No discharge.        Left eye: No discharge.     Conjunctiva/sclera: Conjunctivae normal.     Pupils: Pupils are equal, round, and reactive to light.  Neck:     Thyroid: No thyromegaly.     Vascular: No JVD.     Trachea: No tracheal deviation.  Cardiovascular:     Rate and Rhythm: Normal rate. Rhythm irregular.     Heart sounds: No murmur heard.   Pulmonary:     Effort: Pulmonary effort is normal. No respiratory distress.     Breath sounds: Normal breath sounds. No wheezing.  Abdominal:     General: There is no distension.     Palpations: Abdomen is soft.     Tenderness: There is no abdominal tenderness.     Comments: hypoactive  Musculoskeletal:     Cervical back: No rigidity or tenderness.     Right lower leg: No edema.     Left lower leg: No edema.  Lymphadenopathy:     Cervical: No cervical adenopathy.  Skin:    General: Skin is warm and dry.     Findings: Bruising (left elbow and right thigh) present.  Neurological:     Mental Status: He is alert.     Comments: He is asleep but arouses. Not able to f/c consistently. Facial symmetry is noted. Able to answer  q's intermittently but disoriented.   Psychiatric:     Comments: Cried during visit      Labs reviewed: Recent Labs    05/18/20 0431 05/19/20 0412 05/23/20 1143  NA 135 136 134  K 3.5 4.2 4.2  CL 92* 94* 93*  CO2 32 30 28  GLUCOSE 153* 157* 151*  BUN 38* 34* 43*  CREATININE 1.42* 1.47* 1.79*  CALCIUM 8.8* 8.7* 9.2  PHOS 3.5 3.2  --    Recent Labs    04/06/20 1138 04/06/20 1138 04/27/20 1122  04/27/20 1122 05/16/20 0316 05/18/20 0431 05/19/20 0412  AST 28  --  21  --  30  --   --   ALT 22  --  14  --  20  --   --   ALKPHOS 108  --  132*   --  119  --   --   BILITOT 0.9  --  1.0  --  1.5*  --   --   PROT 7.2  --  7.6  --  7.5  --   --   ALBUMIN 3.9   < > 3.2*   < > 3.1* 3.1* 2.9*   < > = values in this interval not displayed.   Recent Labs    04/06/20 1138 04/06/20 1138 04/27/20 1122 05/15/20 0652 05/18/20 0431  WBC 6.3   < > 5.8 5.9 5.9  NEUTROABS 4.4  --  3.7 4.0  --   HGB 10.2*   < > 9.4* 9.2* 9.3*  HCT 32.1*   < > 29.5* 29.4* 28.7*  MCV 103.5*   < > 103.1* 103.2* 100.3*  PLT 83*   < > 87* 116* 137*   < > = values in this interval not displayed.   Lab Results  Component Value Date   TSH 1.363 05/15/2020   Lab Results  Component Value Date   HGBA1C 7.4 (H) 05/15/2020   Lab Results  Component Value Date   CHOL 109 11/20/2015   HDL 28 (L) 11/20/2015   LDLCALC 71 11/20/2015   TRIG 52 11/20/2015   CHOLHDL 3.9 11/20/2015    Significant Diagnostic Results in last 30 days:  No results found.  Assessment/Plan 1. DNR (do not resuscitate) Order renewed  - DNR (Do Not Resuscitate)  2. Type 2 diabetes mellitus with hyperosmolar nonketotic hyperglycemia (HCC) Trending down with SSI  He is not eating regularly at this point but may need to transition to meal coverage with lantus if he improves. Continue SSI and add coverage for up to 450   3. Hyponatremia Received 2 liters of NS Slightly improved mentation  See goals of care discussion   4. Acute renal failure, unspecified acute renal failure type (HCC) Due to dehydration and hyperglycemia, confounded by meds   5. Acute delirium Due to hyperglycemia/hyponatremia His goals of care are comfort based and due to periods of agitation and he is taking as needed ativan which seems to help.   6. Chronic systolic congestive heart failure (HCC) Appears dry on exam. Will d/c torsemide and Kdur   7. Pacemaker Noted   8. Chronic ITP (idiopathic thrombocytopenia) (HCC) Plt 192 07/01/20(may be falsely elevated due to dehydration) Followed by heme/onc  9.  Paroxysmal A-fib (HCC) Rate is controlled, continues on coreg   10. Chronic anticoagulation Continues on Pradaxa for CVA risk reduction   11. Benign prostatic hyperplasia without lower urinary tract symptoms No current symptoms Continue Flomax  12. Recurrent depression This has contributed to his decline over the past few years after the death of his wife per his daughter. Will continue Effexor at this time.   12. Advanced care planning  Ross Morgan is an 86 y.o. male with a complex medical history. He has expressed wishes to pass peacefully without aggressive intervention. His wife passed a few years ago due to PD with aspiration pna and received only comfort care. He has expressed wishes to pass away the same way to his family. I have talked with his daughter Ross PopperLinda Morgan. She does not want  him to receive any life sustaining measures. She would like [place] only to intervene when he is uncomfortable. At this time he expresses that he is scared but not in pain. We are allowing his family to visit for compassionate reasons even though we are on "lockdown" due to covid 19 cases. His daughter Ross Morgan expressed appreciation for the care provided. We agreed to no hospitalizations, no IVF, and no tube feedings, and no treatment of infections or "root causes" of his problem. A most form was left a the desk and the nurse will ensure that it is signed later today for their visit.   Spent 20 minutes in counseling and coordination   Family/ staff Communication: discussed with his daughter and POA Ross Morgan   Labs/tests ordered: None due to goals of care. Could consider BMP due to rapid shifts in potassium with insulin treatment but given that his goals of care are to only intervene if he is uncomfortable, this may not be congruent. Will discuss with Dr. Renato Gails.

## 2020-07-04 ENCOUNTER — Encounter: Payer: Self-pay | Admitting: Internal Medicine

## 2020-07-04 ENCOUNTER — Non-Acute Institutional Stay (SKILLED_NURSING_FACILITY): Payer: Medicare PPO | Admitting: Internal Medicine

## 2020-07-04 DIAGNOSIS — R627 Adult failure to thrive: Secondary | ICD-10-CM

## 2020-07-04 DIAGNOSIS — N183 Chronic kidney disease, stage 3 unspecified: Secondary | ICD-10-CM

## 2020-07-04 DIAGNOSIS — D693 Immune thrombocytopenic purpura: Secondary | ICD-10-CM | POA: Diagnosis not present

## 2020-07-04 DIAGNOSIS — F322 Major depressive disorder, single episode, severe without psychotic features: Secondary | ICD-10-CM

## 2020-07-04 DIAGNOSIS — E1100 Type 2 diabetes mellitus with hyperosmolarity without nonketotic hyperglycemic-hyperosmolar coma (NKHHC): Secondary | ICD-10-CM | POA: Diagnosis not present

## 2020-07-04 DIAGNOSIS — I480 Paroxysmal atrial fibrillation: Secondary | ICD-10-CM

## 2020-07-04 DIAGNOSIS — D6869 Other thrombophilia: Secondary | ICD-10-CM

## 2020-07-04 DIAGNOSIS — Z950 Presence of cardiac pacemaker: Secondary | ICD-10-CM

## 2020-07-04 DIAGNOSIS — N2889 Other specified disorders of kidney and ureter: Secondary | ICD-10-CM

## 2020-07-04 DIAGNOSIS — N400 Enlarged prostate without lower urinary tract symptoms: Secondary | ICD-10-CM

## 2020-07-04 DIAGNOSIS — I5022 Chronic systolic (congestive) heart failure: Secondary | ICD-10-CM

## 2020-07-04 DIAGNOSIS — I4891 Unspecified atrial fibrillation: Secondary | ICD-10-CM

## 2020-07-04 NOTE — Progress Notes (Signed)
Provider:  Gwenith Spitz. Renato Gails, D.O., C.M.D. Location:  Medical illustrator of Service:  SNF (31)  PCP: [name], MD Patient Care Team: [name], MD as PCP - General (Internal Medicine) Runell Gess, MD as PCP - Cardiology (Cardiology) Magrinat, Valentino Hue, MD as Consulting Physician (Oncology)  Extended Emergency Contact Information Primary Emergency Contact: Seals,Linda Address: [address]          [place], [place] [address] Darden Amber of [place] Phone: [phone] Relation: Daughter Secondary Emergency Contact: Hettie Holstein States of [place] Mobile Phone: [phone] Relation: Son  Code Status: DNR, MOST Goals of Care: Advanced Directive information Advanced Directives 07/03/2020  Does Patient Have a Medical Advance Directive? Yes  Type of Estate agent of [place];Living will;Out of facility DNR (pink MOST or yellow form)  Does patient want to make changes to medical advance directive? -  Copy of Healthcare Power of Attorney in Chart? -  Would patient like information on creating a medical advance directive? -  Pre-existing out of facility DNR order (yellow form or pink MOST form) Yellow form placed in chart (order not valid for inpatient use)   Chief Complaint  Patient presents with  . 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  Please see NP note for more historical details.  History and her notes have been reviewed.  When seen by me, he was resting in bed, but kept moving around.  He denied physical pain.  He admitted to sadness and depression.  He had told his daughter he'd like to be with his wife who passed away 4 years ago.  His other major complaint was a bad taste in  his mouth.  He's had thrush which he had not been faithfully taking the nystatin for at home and considerable pain in the mouth initially when he got here.  The pain improved, but the bad taste remains and nothing tastes good.  He's been on q6h novolog SSI and cbgs and readings have been in 200s and 300s past couple of days, had been in 400s first 24 hrs.    Many of his meds were stopped except his cardiac meds and the insulin and his effexor antidepressant which does not appear to be helping him at all at this point.  He has lost his will to live.  Past Medical History:  Diagnosis Date  . Acute on chronic systolic CHF (congestive heart failure) (HCC) 05/16/2020  . Anal fissure   . Arthritis    "left pointer" (05/30/2014)  . Atrial fibrillation (HCC)   . Bradycardia   . Depression   . Hyperlipidemia   . Hypertension   . Knee pain 2015   "tore tendon left knee; no OR"  . On continuous oral anticoagulation   . Stroke (HCC)   . Symptomatic bradycardia    status post dual AV permanent transvenous pacemaker insertion  . Type II diabetes mellitus (HCC)    Past Surgical History:  Procedure Laterality Date  . CATARACT EXTRACTION W/ INTRAOCULAR LENS  IMPLANT, BILATERAL Bilateral 2000's  . PACEMAKER INSERTION  05/31/14   STJ dual chamber pacemaker implanted by Dr Ladona Ridgel for symptomatic bradycardia  . PERMANENT PACEMAKER INSERTION N/A 05/31/2014   Procedure: PERMANENT PACEMAKER INSERTION;  Surgeon:  Marinus MawGregg W Taylor, MD;  [facility];  Service: Cardiovascular;  Laterality: N/A;    Social History   Socioeconomic History  . Marital status: Married    Spouse name: Not on file  . Number of children: 2  . Years of education: Not on file  . Highest education level: Not on file  Occupational History  . Occupation: retired Biomedical scientistperiodontist  Tobacco Use  . Smoking status: Former Smoker    Years: 1.00    Types: Cigarettes  . Smokeless tobacco: Never Used  . Tobacco comment: "quit smoking in ~  1952  Vaping Use  . Vaping Use: Never used  Substance and Sexual Activity  . Alcohol use: Yes    Comment: 05/30/2014 "glass of wine ~ once/month"  . Drug use: No  . Sexual activity: Yes  Other Topics Concern  . Not on file  Social History Narrative  . Not on file   Social Determinants of Health   Financial Resource Strain:   . Difficulty of Paying Living Expenses: Not on file  Food Insecurity:   . Worried About Programme researcher, broadcasting/film/videounning Out of Food in the Last Year: Not on file  . Ran Out of Food in the Last Year: Not on file  Transportation Needs:   . Lack of Transportation (Medical): Not on file  . Lack of Transportation (Non-Medical): Not on file  Physical Activity:   . Days of Exercise per Week: Not on file  . Minutes of Exercise per Session: Not on file  Stress:   . Feeling of Stress : Not on file  Social Connections:   . Frequency of Communication with Friends and Family: Not on file  . Frequency of Social Gatherings with Friends and Family: Not on file  . Attends Religious Services: Not on file  . Active Member of Clubs or Organizations: Not on file  . Attends BankerClub or Organization Meetings: Not on file  . Marital Status: Not on file    reports that he has quit smoking. His smoking use included cigarettes. He quit after 1.00 year of use. He has never used smokeless tobacco. He reports current alcohol use. He reports that he does not use drugs.  Functional Status Survey:    Family History  Problem Relation Age of Onset  . Heart attack Neg Hx     Health Maintenance  Topic Date Due  . FOOT EXAM  Never done  . OPHTHALMOLOGY EXAM  Never done  . URINE MICROALBUMIN  Never done  . PNA vac Low Risk Adult (2 of 2 - PCV13) 02/26/2015  . INFLUENZA VACCINE  06/11/2020  . HEMOGLOBIN A1C  11/15/2020  . TETANUS/TDAP  08/23/2029  . COVID-19 Vaccine  Completed    Allergies  Allergen Reactions  . Hctz [Hydrochlorothiazide] Other (See Comments)    Severe reaction  . Zetia [Ezetimibe]  Other (See Comments)    LONG TIME AGO    Outpatient Encounter Medications as of 07/04/2020  Medication Sig  . atorvastatin (LIPITOR) 20 MG tablet Take 20 mg by mouth at bedtime.  . Carboxymethylcellulose Sodium (LUBRICANT EYE DROPS OP) Apply 1 drop to eye daily as needed (dry eye).   . carvedilol (COREG) 3.125 MG tablet Take 3.125 mg by mouth in the morning and at bedtime.  . dabigatran (PRADAXA) 150 MG CAPS capsule Take 150 mg by mouth 2 (two) times daily.  . insulin aspart (NOVOLOG) 100 UNIT/ML injection Inject 2-12 Units into the skin every 6 (six) hours. 151-200=2 units, 201-254=4 units, 255-300=6  units, 301-350=8 units, 351-400=10 units, 401-450=12 units  . Lidocaine, Anorectal, (RECTICARE EX) Apply 1 application topically daily as needed (rectal pain).  . LORazepam (ATIVAN) 0.5 MG tablet Take 1 tablet (0.5 mg total) by mouth every 4 (four) hours as needed for anxiety.  . tamsulosin (FLOMAX) 0.4 MG CAPS capsule Take 0.4 mg by mouth at bedtime.   [name] venlafaxine (EFFEXOR) 37.5 MG tablet Take 37.5 mg by mouth daily.    No facility-administered encounter medications on file as of 07/04/2020.    Review of Systems  Constitutional: Positive for malaise/fatigue and weight loss. Negative for chills and fever.  HENT: Negative for sore throat.        No longer having mouth pain, but tastes bad  Eyes: Negative for blurred vision.  Respiratory: Negative for cough and shortness of breath.   Cardiovascular: Negative for chest pain, palpitations and leg swelling.  Gastrointestinal: Negative for abdominal pain, constipation, diarrhea, nausea and vomiting.  Genitourinary: Negative for dysuria.  Musculoskeletal: Positive for falls. Negative for back pain, joint pain, myalgias and neck pain.  Skin: Negative for rash.  Neurological: Positive for weakness. Negative for dizziness and loss of consciousness.  Psychiatric/Behavioral: Positive for depression. Negative for memory loss. The patient is not  nervous/anxious and does not have insomnia.        Recent confusion during hyperglycemia; had med mgt since last hospitalization    There were no vitals filed for this visit. There is no height or weight on file to calculate BMI. Physical Exam Vitals reviewed.  Constitutional:      General: He is not in acute distress.    Appearance: He is ill-appearing. He is not toxic-appearing.     Comments: Easily arousable, appears disinterested in everything  HENT:     Head: Normocephalic and atraumatic.     Right Ear: External ear normal.     Left Ear: External ear normal.     Nose: Nose normal.     Mouth/Throat:     Pharynx: Oropharynx is clear.     Comments: Tongue no longer has white patches, some residual erythema, few patches in back of throat  Eyes:     Extraocular Movements: Extraocular movements intact.     Conjunctiva/sclera: Conjunctivae normal.     Pupils: Pupils are equal, round, and reactive to light.  Cardiovascular:     Rate and Rhythm: Rhythm irregular.     Heart sounds: No murmur heard.   Pulmonary:     Effort: Pulmonary effort is normal.     Breath sounds: Normal breath sounds. No wheezing, rhonchi or rales.  Abdominal:     General: Bowel sounds are normal.     Palpations: Abdomen is soft.     Tenderness: There is no abdominal tenderness.  Musculoskeletal:     Cervical back: Neck supple.     Right lower leg: No edema.     Left lower leg: No edema.  Skin:    General: Skin is warm and dry.     Coloration: Skin is pale.     Comments: No skin breakdown noted  Neurological:     Mental Status: He is alert.     Motor: Weakness present.     Gait: Gait abnormal.     Comments: Left hemiparesis, but now generalized weakness, difficulty repositioning self in bed  Psychiatric:     Comments: Flat affect, closes his eyes again quickly after responding briefly and softly to questions     Labs reviewed: Basic Metabolic Panel:  Recent Labs    05/18/20 0431 05/19/20 0412  05/23/20 1143  NA 135 136 134  K 3.5 4.2 4.2  CL 92* 94* 93*  CO2 32 30 28  GLUCOSE 153* 157* 151*  BUN 38* 34* 43*  CREATININE 1.42* 1.47* 1.79*  CALCIUM 8.8* 8.7* 9.2  PHOS 3.5 3.2  --    Liver Function Tests: Recent Labs    04/06/20 1138 04/06/20 1138 04/27/20 1122 04/27/20 1122 05/16/20 0316 05/18/20 0431 05/19/20 0412  AST 28  --  21  --  30  --   --   ALT 22  --  14  --  20  --   --   ALKPHOS 108  --  132*  --  119  --   --   BILITOT 0.9  --  1.0  --  1.5*  --   --   PROT 7.2  --  7.6  --  7.5  --   --   ALBUMIN 3.9   < > 3.2*   < > 3.1* 3.1* 2.9*   < > = values in this interval not displayed.   No results for input(s): LIPASE, AMYLASE in the last 8760 hours. No results for input(s): AMMONIA in the last 8760 hours. CBC: Recent Labs    04/06/20 1138 04/06/20 1138 04/27/20 1122 05/15/20 0652 05/18/20 0431  WBC 6.3   < > 5.8 5.9 5.9  NEUTROABS 4.4  --  3.7 4.0  --   HGB 10.2*   < > 9.4* 9.2* 9.3*  HCT 32.1*   < > 29.5* 29.4* 28.7*  MCV 103.5*   < > 103.1* 103.2* 100.3*  PLT 83*   < > 87* 116* 137*   < > = values in this interval not displayed.   Cardiac Enzymes: No results for input(s): CKTOTAL, CKMB, CKMBINDEX, TROPONINI in the last 8760 hours. BNP: Invalid input(s): POCBNP Lab Results  Component Value Date   HGBA1C 7.4 (H) 05/15/2020   Lab Results  Component Value Date   TSH 1.363 05/15/2020   Lab Results  Component Value Date   VITAMINB12 355 02/22/2020   Lab Results  Component Value Date   FOLATE 52.9 02/22/2020   Lab Results  Component Value Date   IRON 53 02/22/2020   TIBC 396 02/22/2020   FERRITIN 103 02/22/2020    Assessment/Plan 1. Failure to thrive in adult -pt with depression, poor po intake and seems to have decided he's had enough with life and wants to pass away  2. Depression, major, single episode, severe (HCC) -not eating much at all, had a liter of fluid today and is passing urine -effexor not helping, add remeron  and see if helps, but unlikely, need to speak with his daughter more   3. Type 2 diabetes mellitus with hyperosmolar nonketotic hyperglycemia (HCC) -had previously been well-controlled, but recently taking only sweet drinks and very little food (chocolate chip cookies at home) -decrease cbgs to bid and novolog SSI  4. Paroxysmal A-fib (HCC) -cont pradaxa for now and coreg, monitor  5. Hypercoagulable state due to atrial fibrillation (HCC) -cont pradaxa as of today, but will discuss with his daughter--unlikely helpful given his personal established goals  6. Chronic ITP (idiopathic thrombocytopenia) (HCC) -has had and was on treatment at one time  7. Benign prostatic hyperplasia without lower urinary tract symptoms -is passing urine, but at risk for retention as he declined due to this  8. CRI (chronic renal insufficiency), stage 3 (moderate) -also notable  and his ARB was held and diuretic due to his poor intake and declining status -Avoid nephrotoxic agents like nsaids, dose adjust renally excreted meds, hydrate.  9. Chronic systolic congestive heart failure (HCC) -no signs of acute exacerbation, off diuretic and arb, continues on coreg  10. Pacemaker -was for tachy/brady with afib--has followed with Dr. Ladona Ridgel -no defibrillator  Family/ staff Communication: discussed with SNF nurse and nurse manager  Labs/tests ordered: no new due to goals of care established with MOST yesterday  Epic Tribbett L. Decari Duggar, D.O. Geriatrics [facility] [address] [place], [place] [address] Cell Phone (Mon-Fri 8am-5pm):  [phone] On Call:  [phone] & follow prompts after 5pm & weekends Office Phone:  [phone] Office Fax:  [phone]

## 2020-07-05 ENCOUNTER — Encounter: Payer: Self-pay | Admitting: Internal Medicine

## 2020-07-05 ENCOUNTER — Non-Acute Institutional Stay (SKILLED_NURSING_FACILITY): Payer: Medicare PPO | Admitting: Internal Medicine

## 2020-07-05 DIAGNOSIS — F339 Major depressive disorder, recurrent, unspecified: Secondary | ICD-10-CM

## 2020-07-05 DIAGNOSIS — R627 Adult failure to thrive: Secondary | ICD-10-CM

## 2020-07-05 DIAGNOSIS — F419 Anxiety disorder, unspecified: Secondary | ICD-10-CM | POA: Diagnosis not present

## 2020-07-05 DIAGNOSIS — Z7189 Other specified counseling: Secondary | ICD-10-CM

## 2020-07-05 DIAGNOSIS — R410 Disorientation, unspecified: Secondary | ICD-10-CM | POA: Diagnosis not present

## 2020-07-05 MED ORDER — LORAZEPAM 2 MG/ML PO CONC: 1.0000 mg | ORAL | 0 refills | Status: AC | PRN

## 2020-07-05 MED ORDER — MORPHINE SULFATE (CONCENTRATE) 20 MG/ML PO SOLN: 5.0000 mg | ORAL | 0 refills | Status: AC | PRN

## 2020-07-05 NOTE — Telephone Encounter (Signed)
This encounter was created in error - please disregard.

## 2020-07-05 NOTE — Progress Notes (Signed)
Location:      Place of Service:    Provider:  Renia Mikelson L. Mariea Clonts, D.O., C.M.D.  Marton Redwood, MD  Patient Care Team: Marton Redwood, MD as PCP - General (Internal Medicine) Lorretta Harp, MD as PCP - Cardiology (Cardiology) Magrinat, Virgie Dad, MD as Consulting Physician (Oncology)  Extended Emergency Contact Information Primary Emergency Contact: Seals,Linda Address: [place], [place] [address] Johnnette Litter of [place] Phone: [phone] Relation: Daughter Secondary Emergency Contact: Ellwood Sayers States of [place] Mobile Phone: [phone] Relation: Son  Code Status:  DNR, MOST, comfort Goals of care: Advanced Directive information Advanced Directives 07/04/2020  Does Patient Have a Medical Advance Directive? Yes  Type of Paramedic of [place];Out of facility DNR (pink MOST or yellow form);Living will  Does patient want to make changes to medical advance directive? No - Patient declined  Copy of [place] in Chart? Yes - validated most recent copy scanned in chart (See row information)  Would patient like information on creating a medical advance directive? -  Pre-existing out of facility DNR order (yellow form or pink MOST form) Pink MOST/Yellow Form most recent copy in chart - Physician notified to receive inpatient order     Chief Complaint  Patient presents with  . advance care planning    met with his daughter and son-in-law to discuss hospice   Dr. Otila Kluver was continuing to decline today.  He had very little to drink today (just 125cc) and no output until a few mins ago when he had 300cc out.  He has been extremely fidgety and uncomfortable since last night.  He had another fall as a result.  The nurse wound up doing her charting at his bedside b/c he was moving so often and kept attempting to sit up on the side of roll over, or pulls his socks off, cover and uncover himself.    I  met with his daughter and son-in-law for about an hour today reviewing some background they could provide about him.  He has really not done well since his hospital stay early July.  His function has not fully returned and he seemed to lose his desire to live.  He's had caregivers in his home but did not like losing his independence.  He had medication mgt and before this admission had missed a few days of meds from his pillbox and had been drinking large amounts of sweet beverages like lemonade and OJ and eating chocolate chip cookies.  He had actually texted his children about his desire to die and be with his wife.  He'd asked his son to come pick up his dog.  He was not using the nystatin for his thrush as directed.  He'd become very weak and could not stay at home.  Sugars were HI.  Labs done by on call showed glucose in the 800s.    Since coming over to rehab, he has not improved.  He does not have a zest to live and his daughter reports, he has not had good quality of life since the hospitalization.  He's no longer reading on his kindle which he really enjoyed after he could no longer do other more exertional things he liked like woodworking.  He was confused at one point recently and asked about his wife as though she was still alive and cried and cried when [place] told him she had passed 4 years  ago.    Today, nursing also noted increased confusion.  He's continue to deny pain.    We discussed his pacemaker and that it's not a defibrillator so will not need to be turned off at end of life--it will just gradually stop working for him as he passes away.  We discussed hospice which Bonita Quin agrees will be helpful to support the family during this time and after her father's death.    We talked about morphine and how it can help his discomfort along with increasing his ativan dosage.  She and her husband accept anything that will make him comfortable.    Past Medical History:  Diagnosis Date  . Acute  on chronic systolic CHF (congestive heart failure) (HCC) 05/16/2020  . Anal fissure   . Arthritis    "left pointer" (05/30/2014)  . Atrial fibrillation (HCC)   . Bradycardia   . Depression   . Hyperlipidemia   . Hypertension   . Knee pain 2015   "tore tendon left knee; no OR"  . On continuous oral anticoagulation   . Stroke (HCC)   . Symptomatic bradycardia    status post dual AV permanent transvenous pacemaker insertion  . Type II diabetes mellitus (HCC)    Past Surgical History:  Procedure Laterality Date  . CATARACT EXTRACTION W/ INTRAOCULAR LENS  IMPLANT, BILATERAL Bilateral 2000's  . PACEMAKER INSERTION  05/31/14   STJ dual chamber pacemaker implanted by Dr Ladona Ridgel for symptomatic bradycardia  . PERMANENT PACEMAKER INSERTION N/A 05/31/2014   Procedure: PERMANENT PACEMAKER INSERTION;  Surgeon: Marinus Maw, MD;  Location: [facility] CATH LAB;  Service: Cardiovascular;  Laterality: N/A;    Allergies  Allergen Reactions  . Hctz [Hydrochlorothiazide] Other (See Comments)    Severe reaction  . Zetia [Ezetimibe] Other (See Comments)    LONG TIME AGO    Outpatient Encounter Medications as of 07/05/2020  Medication Sig  . atorvastatin (LIPITOR) 20 MG tablet Take 20 mg by mouth at bedtime.  . bisacodyl (DULCOLAX) 10 MG suppository Place 10 mg rectally as needed for moderate constipation.  . carvedilol (COREG) 3.125 MG tablet Take 3.125 mg by mouth in the morning and at bedtime.  . dabigatran (PRADAXA) 150 MG CAPS capsule Take 150 mg by mouth 2 (two) times daily.  . insulin aspart (NOVOLOG) 100 UNIT/ML injection Inject 2-12 Units into the skin every 6 (six) hours. 151-200=2 units, 201-254=4 units, 255-300=6 units, 301-350=8 units, 351-400=10 units, 401-450=12 units  . LORazepam (ATIVAN) 0.5 MG tablet Take 1 tablet (0.5 mg total) by mouth every 4 (four) hours as needed for anxiety.  [name] nystatin (MYCOSTATIN) 100000 UNIT/ML suspension Take by mouth.  . tamsulosin (FLOMAX) 0.4 MG CAPS capsule  Take 0.4 mg by mouth at bedtime.   [name] venlafaxine (EFFEXOR) 37.5 MG tablet Take 37.5 mg by mouth daily.    No facility-administered encounter medications on file as of 07/05/2020.      Immunization History  Administered Date(s) Administered  . Moderna SARS-COVID-2 Vaccination 11/23/2019, 12/22/2019  . Pneumococcal Polysaccharide-23 01/10/2010, 02/25/2014  . Tdap 08/24/2019   Pertinent  Health Maintenance Due  Topic Date Due  . FOOT EXAM  Never done  . OPHTHALMOLOGY EXAM  Never done  . URINE MICROALBUMIN  Never done  . PNA vac Low Risk Adult (2 of 2 - PCV13) 02/26/2015  . INFLUENZA VACCINE  06/11/2020  . HEMOGLOBIN A1C  11/15/2020   Fall Risk  09/15/2017 09/04/2016  Falls in the past year? No No   Labs reviewed:  Recent Labs    05/18/20 0431 05/18/20 0431 05/19/20 0412 05/23/20 1143 07/01/20 0000  NA 135   < > 136 134 124*  K 3.5   < > 4.2 4.2 4.1  CL 92*   < > 94* 93* 86*  CO2 32   < > 30 28 25*  GLUCOSE 153*  --  157* 151*  --   BUN 38*   < > 34* 43* 72*  CREATININE 1.42*   < > 1.47* 1.79* 2.0*  CALCIUM 8.8*   < > 8.7* 9.2 9.5  PHOS 3.5  --  3.2  --   --    < > = values in this interval not displayed.   Recent Labs    04/06/20 1138 04/06/20 1138 04/27/20 1122 04/27/20 1122 05/16/20 0316 05/18/20 0431 05/19/20 0412  AST 28  --  21  --  30  --   --   ALT 22  --  14  --  20  --   --   ALKPHOS 108  --  132*  --  119  --   --   BILITOT 0.9  --  1.0  --  1.5*  --   --   PROT 7.2  --  7.6  --  7.5  --   --   ALBUMIN 3.9   < > 3.2*   < > 3.1* 3.1* 2.9*   < > = values in this interval not displayed.   Recent Labs    04/06/20 1138 04/06/20 1138 04/27/20 1122 04/27/20 1122 05/15/20 0652 05/18/20 0431 07/01/20 0000  WBC 6.3   < > 5.8  --  5.9 5.9 8.7  NEUTROABS 4.4  --  3.7  --  4.0  --   --   HGB 10.2*   < > 9.4*  --  9.2* 9.3* 12.8*  HCT 32.1*   < > 29.5*   < > 29.4* 28.7* 40*  MCV 103.5*   < > 103.1*  --  103.2* 100.3*  --   PLT 83*   < > 87*  --  116*  137* 192   < > = values in this interval not displayed.   Lab Results  Component Value Date   TSH 1.363 05/15/2020   Lab Results  Component Value Date   HGBA1C 7.4 (H) 05/15/2020   Lab Results  Component Value Date   CHOL 109 11/20/2015   HDL 28 (L) 11/20/2015   LDLCALC 71 11/20/2015   TRIG 52 11/20/2015   CHOLHDL 3.9 11/20/2015    Significant Diagnostic Results in last 30 days:  CUP PACEART REMOTE DEVICE CHECK  Result Date: 06/08/2020 Scheduled remote reviewed. Normal device function.  Next remote 91 days. Kathy Breach, RN, CCDS, [employer] Scheduled remote reviewed. Normal device function.  Next remote 91 days. Kathy Breach, RN, CCDS, [employer]   Assessment/Plan 1. ACP (advance care planning) -60 mins spent today on ACP -MOST was already completed by NP for comfort measures--asked that forms be uploaded to vynca -today, I met with his daughter, Vaughan Basta, and we opted to d/c any remaining po meds, increase his ativan dose to $Remov'1mg'UrliWL$  po q4h prn fidgeting, restlessness, anxiety and add roxanol $RemoveBef'20mg'vwOCKiRjds$ /m'5mg'$  po q4h prn discomfort, signs or symptoms of pain or dyspnea -referral placed to [facility] -we discussed his CBGs and novolog and opted to continue to avoid negative symptoms from worsening hyperglycemia at least for now -recommended his son also come visit again  soon -estimated prognosis of days at this point  Family/ staff Communication: 60 mins spent with family discussing ACP  Ayaana Biondo L. Savera Donson, D.O. [facility] [address], [place] [address] Cell Phone (Mon-Fri 8am-5pm):  [phone] On Call:  [phone] & follow prompts after 5pm & weekends Office Phone:  [phone] Office Fax:  [phone]

## 2020-07-06 DIAGNOSIS — R627 Adult failure to thrive: Secondary | ICD-10-CM | POA: Insufficient documentation

## 2020-07-06 DIAGNOSIS — F322 Major depressive disorder, single episode, severe without psychotic features: Secondary | ICD-10-CM | POA: Insufficient documentation

## 2020-07-10 ENCOUNTER — Ambulatory Visit: Payer: Medicare PPO | Admitting: Oncology

## 2020-07-10 ENCOUNTER — Telehealth: Payer: Self-pay | Admitting: Cardiovascular Disease

## 2020-07-10 ENCOUNTER — Encounter: Payer: Self-pay | Admitting: Adult Health

## 2020-07-10 ENCOUNTER — Non-Acute Institutional Stay (SKILLED_NURSING_FACILITY): Payer: Medicare PPO | Admitting: Adult Health

## 2020-07-10 ENCOUNTER — Other Ambulatory Visit: Payer: Medicare PPO

## 2020-07-10 DIAGNOSIS — E1100 Type 2 diabetes mellitus with hyperosmolarity without nonketotic hyperglycemic-hyperosmolar coma (NKHHC): Secondary | ICD-10-CM | POA: Diagnosis not present

## 2020-07-10 DIAGNOSIS — R627 Adult failure to thrive: Secondary | ICD-10-CM | POA: Diagnosis not present

## 2020-07-10 DIAGNOSIS — F322 Major depressive disorder, single episode, severe without psychotic features: Secondary | ICD-10-CM

## 2020-07-10 DIAGNOSIS — R451 Restlessness and agitation: Secondary | ICD-10-CM | POA: Diagnosis not present

## 2020-07-10 DIAGNOSIS — K5903 Drug induced constipation: Secondary | ICD-10-CM | POA: Diagnosis not present

## 2020-07-10 NOTE — Telephone Encounter (Signed)
      1. Has your device fired?   2. Is you device beeping?   3. Are you experiencing draining or swelling at device site?   4. Are you calling to see if we received your device transmission? Regina RN from [facility], she said they need to cancel home remote pacer ck due to pt is in hospice care now.  5. Have you passed out?    Please route to Device Clinic Pool

## 2020-07-10 NOTE — Progress Notes (Signed)
Location:  Medical illustrator of Service:  SNF (31) Provider:   Peggye Ley, ANP [facility] [phone]   [name], MD  Patient Care Team: [name], MD as PCP - General (Internal Medicine) Runell Gess, MD as PCP - Cardiology (Cardiology) Magrinat, Valentino Hue, MD as Consulting Physician (Oncology)  Extended Emergency Contact Information Primary Emergency Contact: Seals,Linda Address: [address]          [place], [place] [address] Darden Amber of [place] Phone: [phone] Relation: Daughter Secondary Emergency Contact: Hettie Holstein [place] of [place] Mobile Phone: [phone] Relation: Son  Code Status:   Goals of care: Advanced Directive information Advanced Directives 07/04/2020  Does Patient Have a Medical Advance Directive? Yes  Type of Estate agent of [place];Out of facility DNR (pink MOST or yellow form);Living will  Does patient want to make changes to medical advance directive? No - Patient declined  Copy of Healthcare Power of Attorney in Chart? Yes - validated most recent copy scanned in chart (See row information)  Would patient like information on creating a medical advance directive? -  Pre-existing out of facility DNR order (yellow form or pink MOST form) Pink MOST/Yellow Form most recent copy in chart - Physician notified to receive inpatient order     Chief Complaint  Patient presents with  . Acute Visit    f/u nausea and end of life care    HPI:  Pt is a 86 y.o. male seen today for an acute visit for    Past Medical History:  Diagnosis Date  . Acute on chronic systolic CHF (congestive heart failure) (HCC) 05/16/2020  . Anal fissure   . Arthritis    "left pointer" (05/30/2014)  . Atrial fibrillation (HCC)   . Bradycardia   . Depression   . Hyperlipidemia   . Hypertension   . Knee pain 2015   "tore tendon left knee; no OR"  . On continuous oral  anticoagulation   . Stroke (HCC)   . Symptomatic bradycardia    status post dual AV permanent transvenous pacemaker insertion  . Type II diabetes mellitus (HCC)    Past Surgical History:  Procedure Laterality Date  . CATARACT EXTRACTION W/ INTRAOCULAR LENS  IMPLANT, BILATERAL Bilateral 2000's  . PACEMAKER INSERTION  05/31/14   STJ dual chamber pacemaker implanted by Dr Ladona Ridgel for symptomatic bradycardia  . PERMANENT PACEMAKER INSERTION N/A 05/31/2014   Procedure: PERMANENT PACEMAKER INSERTION;  Surgeon: Marinus Maw, MD;  Location: [facility] CATH LAB;  Service: Cardiovascular;  Laterality: N/A;    Allergies  Allergen Reactions  . Hctz [Hydrochlorothiazide] Other (See Comments)    Severe reaction  . Zetia [Ezetimibe] Other (See Comments)    LONG TIME AGO    Outpatient Encounter Medications as of 07/10/2020  Medication Sig  . prochlorperazine (COMPAZINE) 10 MG tablet Take 10 mg by mouth every 4 (four) hours as needed for nausea or vomiting.  [name] LORazepam (ATIVAN) 2 MG/ML concentrated solution Take 0.5 mLs (1 mg total) by mouth every 4 (four) hours as needed for anxiety (restlessness, fidgeting).  . morphine (ROXANOL) 20 MG/ML concentrated solution Take 0.25 mLs (5 mg total) by mouth every 4 (four) hours as needed for moderate pain or shortness of breath (discomfort, signs/symptoms of pain).  . [DISCONTINUED] insulin aspart (NOVOLOG) 100 UNIT/ML injection Inject 2-12 Units into the skin every 6 (six) hours. 151-200=2 units, 201-254=4 units, 255-300=6 units, 301-350=8 units, 351-400=10 units, 401-450=12 units  No facility-administered encounter medications on file as of 07/10/2020.    Review of Systems  Immunization History  Administered Date(s) Administered  . Moderna SARS-COVID-2 Vaccination 11/23/2019, 12/22/2019  . Pneumococcal Polysaccharide-23 01/10/2010, 02/25/2014  . Tdap 08/24/2019   Pertinent  Health Maintenance Due  Topic Date Due  . FOOT EXAM  Never done  . OPHTHALMOLOGY  EXAM  Never done  . URINE MICROALBUMIN  Never done  . PNA vac Low Risk Adult (2 of 2 - PCV13) 02/26/2015  . INFLUENZA VACCINE  06/11/2020  . HEMOGLOBIN A1C  11/15/2020   Fall Risk  09/15/2017 09/04/2016  Falls in the past year? No No   Functional Status Survey:    Vitals:   07/10/20 0952  Temp: (!) 97.3 F (36.3 C)   There is no height or weight on file to calculate BMI. Physical Exam  Labs reviewed: Recent Labs    05/18/20 0431 05/18/20 0431 05/19/20 0412 05/23/20 1143 07/01/20 0000  NA 135   < > 136 134 124*  K 3.5   < > 4.2 4.2 4.1  CL 92*   < > 94* 93* 86*  CO2 32   < > 30 28 25*  GLUCOSE 153*  --  157* 151*  --   BUN 38*   < > 34* 43* 72*  CREATININE 1.42*   < > 1.47* 1.79* 2.0*  CALCIUM 8.8*   < > 8.7* 9.2 9.5  PHOS 3.5  --  3.2  --   --    < > = values in this interval not displayed.   Recent Labs    04/06/20 1138 04/06/20 1138 04/27/20 1122 04/27/20 1122 05/16/20 0316 05/18/20 0431 05/19/20 0412  AST 28  --  21  --  30  --   --   ALT 22  --  14  --  20  --   --   ALKPHOS 108  --  132*  --  119  --   --   BILITOT 0.9  --  1.0  --  1.5*  --   --   PROT 7.2  --  7.6  --  7.5  --   --   ALBUMIN 3.9   < > 3.2*   < > 3.1* 3.1* 2.9*   < > = values in this interval not displayed.   Recent Labs    04/06/20 1138 04/06/20 1138 04/27/20 1122 04/27/20 1122 05/15/20 0652 05/18/20 0431 07/01/20 0000  WBC 6.3   < > 5.8  --  5.9 5.9 8.7  NEUTROABS 4.4  --  3.7  --  4.0  --   --   HGB 10.2*   < > 9.4*  --  9.2* 9.3* 12.8*  HCT 32.1*   < > 29.5*   < > 29.4* 28.7* 40*  MCV 103.5*   < > 103.1*  --  103.2* 100.3*  --   PLT 83*   < > 87*  --  116* 137* 192   < > = values in this interval not displayed.   Lab Results  Component Value Date   TSH 1.363 05/15/2020   Lab Results  Component Value Date   HGBA1C 7.4 (H) 05/15/2020   Lab Results  Component Value Date   CHOL 109 11/20/2015   HDL 28 (L) 11/20/2015   LDLCALC 71 11/20/2015   TRIG 52 11/20/2015    CHOLHDL 3.9 11/20/2015    Significant Diagnostic Results in last 30 days:  No results found.  Assessment/Plan There are no diagnoses linked to this encounter.  D/ insulin and CBGs  Add Dulcolax supp PR qd prn constipation Family/ staff Communication:   Labs/tests ordered:   This encounter was created in error - please disregard.

## 2020-07-10 NOTE — Telephone Encounter (Signed)
Home remote monitoring apts.canceled.

## 2020-07-11 ENCOUNTER — Encounter: Payer: Self-pay | Admitting: Adult Health

## 2020-07-11 NOTE — Progress Notes (Signed)
Location:  Medical illustrator of Service:  SNF (31) Provider:   Peggye Ley, ANP [facility] [phone]   [name], MD  Patient Care Team: [name], MD as PCP - General (Internal Medicine) Runell Gess, MD as PCP - Cardiology (Cardiology) Magrinat, Valentino Hue, MD as Consulting Physician (Oncology)  Extended Emergency Contact Information Primary Emergency Contact: Seals,Linda Address: [address]          [place], [place] [address] Darden [place] of [place] Phone: [phone] Relation: Daughter Secondary Emergency Contact: Hettie Holstein [place] of [place] Mobile Phone: [phone] Relation: Son  Code Status:  DNR Goals of care: Advanced Directive information Advanced Directives 07/04/2020  Does Patient Have a Medical Advance Directive? Yes  Type of Estate agent of [place];Out of facility DNR (pink MOST or yellow form);Living will  Does patient want to make changes to medical advance directive? No - Patient declined  Copy of Healthcare Power of Attorney in Chart? Yes - validated most recent copy scanned in chart (See row information)  Would patient like information on creating a medical advance directive? -  Pre-existing out of facility DNR order (yellow form or pink MOST form) Pink MOST/Yellow Form most recent copy in chart - Physician notified to receive inpatient order     Chief Complaint  Patient presents with  . Acute Visit    f/u end of life care    HPI:  Pt is a 86 y.o. male seen today for an acute visit for end of life care. Ross Morgan has a PMH with CHF, afib, diabetes, depression, HTN, HLD. Pacemaker due to bradycardia, and CKD. He is followed by hospice due to FTT, weight loss, depression, and hyperosmolar non ketotic hyperglycemia.  The nurse reports he is having some restlessness and moaning. They are using ativan and roxanol which seems to help. He is resting  comfortably in bed now. He is not eating and drinking very little. His blood sugars are better controlled at this time in the 131-200 range. He continues with CBG checks BID and SSI. No reports of low sugars.  He had one BM on 8/27 after a supp. Prior to that it had been several days. He is having some nausea intermittently and compazine is ordered for this. There is no nausea at this time. I received a note from the insurance company that they were not able to cover the compazine. This should be covered under his hospice benefit.    Past Medical History:  Diagnosis Date  . Acute on chronic systolic CHF (congestive heart failure) (HCC) 05/16/2020  . Anal fissure   . Arthritis    "left pointer" (05/30/2014)  . Atrial fibrillation (HCC)   . Bradycardia   . Depression   . Hyperlipidemia   . Hypertension   . Knee pain 2015   "tore tendon left knee; no OR"  . On continuous oral anticoagulation   . Stroke (HCC)   . Symptomatic bradycardia    status post dual AV permanent transvenous pacemaker insertion  . Type II diabetes mellitus (HCC)    Past Surgical History:  Procedure Laterality Date  . CATARACT EXTRACTION W/ INTRAOCULAR LENS  IMPLANT, BILATERAL Bilateral 2000's  . PACEMAKER INSERTION  05/31/14   STJ dual chamber pacemaker implanted by Dr Ladona Ridgel for symptomatic bradycardia  . PERMANENT PACEMAKER INSERTION N/A 05/31/2014   Procedure: PERMANENT PACEMAKER INSERTION;  Surgeon: Marinus Maw, MD;  Location: [facility] CATH LAB;  Service: Cardiovascular;  Laterality: N/A;    Allergies  Allergen Reactions  . Hctz [Hydrochlorothiazide] Other (See Comments)    Severe reaction  . Zetia [Ezetimibe] Other (See Comments)    LONG TIME AGO    Outpatient Encounter Medications as of 07/10/2020  Medication Sig  . bisacodyl (DULCOLAX) 10 MG suppository Place 10 mg rectally as needed for moderate constipation.  [name] LORazepam (ATIVAN) 2 MG/ML concentrated solution Take 0.5 mLs (1 mg total) by mouth every 4  (four) hours as needed for anxiety (restlessness, fidgeting).  . morphine (ROXANOL) 20 MG/ML concentrated solution Take 0.25 mLs (5 mg total) by mouth every 4 (four) hours as needed for moderate pain or shortness of breath (discomfort, signs/symptoms of pain).  . prochlorperazine (COMPAZINE) 10 MG tablet Take 10 mg by mouth every 4 (four) hours as needed for nausea or vomiting.   No facility-administered encounter medications on file as of 07/10/2020.    Review of Systems  Unable to perform ROS: Dementia    Immunization History  Administered Date(s) Administered  . Moderna SARS-COVID-2 Vaccination 11/23/2019, 12/22/2019  . Pneumococcal Polysaccharide-23 01/10/2010, 02/25/2014  . Tdap 08/24/2019   Pertinent  Health Maintenance Due  Topic Date Due  . FOOT EXAM  Never done  . OPHTHALMOLOGY EXAM  Never done  . URINE MICROALBUMIN  Never done  . PNA vac Low Risk Adult (2 of 2 - PCV13) 02/26/2015  . INFLUENZA VACCINE  06/11/2020  . HEMOGLOBIN A1C  11/15/2020   Fall Risk  09/15/2017 09/04/2016  Falls in the past year? No No   Functional Status Survey:    There were no vitals filed for this visit. There is no height or weight on file to calculate BMI. Physical Exam Vitals and nursing note reviewed.  Constitutional:      General: He is not in acute distress.    Appearance: He is not diaphoretic.     Comments: Lethargic, responds to verbal stim with movement but does not open his eyes   HENT:     Head: Normocephalic and atraumatic.  Neck:     Thyroid: No thyromegaly.     Vascular: No JVD.     Trachea: No tracheal deviation.  Cardiovascular:     Rate and Rhythm: Normal rate and regular rhythm.     Heart sounds: No murmur heard.      Comments: Thready pulse to radial artery bilat Pulmonary:     Effort: Pulmonary effort is normal. No respiratory distress.     Breath sounds: Normal breath sounds. No wheezing.  Abdominal:     General: Bowel sounds are normal. There is no  distension.     Palpations: Abdomen is soft.     Tenderness: There is no abdominal tenderness.  Musculoskeletal:     Right lower leg: No edema.     Left lower leg: No edema.  Lymphadenopathy:     Cervical: No cervical adenopathy.  Skin:    General: Skin is warm and dry.     Coloration: Skin is pale.  Neurological:     Comments: Not able to f/c or answer q's. No obvious focal deficit.      Labs reviewed: Recent Labs    05/18/20 0431 05/18/20 0431 05/19/20 0412 05/23/20 1143 07/01/20 0000  NA 135   < > 136 134 124*  K 3.5   < > 4.2 4.2 4.1  CL 92*   < > 94* 93* 86*  CO2 32   < > 30 28 25*  GLUCOSE 153*  --  157* 151*  --   BUN 38*   < > 34* 43* 72*  CREATININE 1.42*   < > 1.47* 1.79* 2.0*  CALCIUM 8.8*   < > 8.7* 9.2 9.5  PHOS 3.5  --  3.2  --   --    < > = values in this interval not displayed.   Recent Labs    04/06/20 1138 04/06/20 1138 04/27/20 1122 04/27/20 1122 05/16/20 0316 05/18/20 0431 05/19/20 0412  AST 28  --  21  --  30  --   --   ALT 22  --  14  --  20  --   --   ALKPHOS 108  --  132*  --  119  --   --   BILITOT 0.9  --  1.0  --  1.5*  --   --   PROT 7.2  --  7.6  --  7.5  --   --   ALBUMIN 3.9   < > 3.2*   < > 3.1* 3.1* 2.9*   < > = values in this interval not displayed.   Recent Labs    04/06/20 1138 04/06/20 1138 04/27/20 1122 04/27/20 1122 05/15/20 0652 05/18/20 0431 07/01/20 0000  WBC 6.3   < > 5.8  --  5.9 5.9 8.7  NEUTROABS 4.4  --  3.7  --  4.0  --   --   HGB 10.2*   < > 9.4*  --  9.2* 9.3* 12.8*  HCT 32.1*   < > 29.5*   < > 29.4* 28.7* 40*  MCV 103.5*   < > 103.1*  --  103.2* 100.3*  --   PLT 83*   < > 87*  --  116* 137* 192   < > = values in this interval not displayed.   Lab Results  Component Value Date   TSH 1.363 05/15/2020   Lab Results  Component Value Date   HGBA1C 7.4 (H) 05/15/2020   Lab Results  Component Value Date   CHOL 109 11/20/2015   HDL 28 (L) 11/20/2015   LDLCALC 71 11/20/2015   TRIG 52 11/20/2015    CHOLHDL 3.9 11/20/2015    Significant Diagnostic Results in last 30 days:  No results found.  Assessment/Plan 1. FTT (failure to thrive) in adult End of life Continue supportive care only  2. Depression, major, single episode, severe (HCC) No longer able to swallow Remeron. Contributed to his decline recently.  3. Agitation Continue Ativan and Roxanol for comfort.   4. Type 2 diabetes mellitus with hyperosmolar nonketotic hyperglycemia (HCC) D/C SSI CBGs (pt not eating)  5. Drug-induced constipation Dulcolax supp PR qd prn  6. Nausea Does not seem to be related to constipation. Continue compazine. Re bill through hospice benefit for coverage  Family/ staff Communication: nurse  Labs/tests ordered:  NA

## 2020-07-13 ENCOUNTER — Ambulatory Visit: Payer: Medicare PPO | Admitting: Cardiology

## 2020-07-28 ENCOUNTER — Other Ambulatory Visit: Payer: Medicare PPO

## 2020-07-28 ENCOUNTER — Encounter: Payer: Self-pay | Admitting: Internal Medicine

## 2020-08-11 DEATH — deceased

## 2020-10-10 ENCOUNTER — Other Ambulatory Visit: Payer: Medicare PPO

## 2020-10-10 ENCOUNTER — Ambulatory Visit: Payer: Medicare PPO | Admitting: Oncology

## 2020-10-24 ENCOUNTER — Ambulatory Visit: Payer: Medicare PPO | Admitting: Cardiovascular Disease

## 2020-10-27 ENCOUNTER — Other Ambulatory Visit: Payer: Medicare PPO

## 2021-01-08 ENCOUNTER — Other Ambulatory Visit: Payer: Medicare PPO

## 2021-01-08 ENCOUNTER — Ambulatory Visit: Payer: Medicare PPO | Admitting: Oncology

## 2021-01-25 ENCOUNTER — Other Ambulatory Visit: Payer: Medicare PPO

## 2021-04-05 ENCOUNTER — Ambulatory Visit: Payer: Medicare PPO | Admitting: Oncology

## 2021-04-05 ENCOUNTER — Other Ambulatory Visit: Payer: Medicare PPO

## 2021-04-27 ENCOUNTER — Other Ambulatory Visit: Payer: Medicare PPO
